# Patient Record
Sex: Female | Born: 1945 | Race: White | Hispanic: No | Smoking: Former smoker
Health system: Southern US, Community
[De-identification: ages and names within clinical notes are randomized; demographics above are authoritative.]

## PROBLEM LIST (undated history)

## (undated) DIAGNOSIS — N200 Calculus of kidney: Secondary | ICD-10-CM

## (undated) DIAGNOSIS — M1990 Unspecified osteoarthritis, unspecified site: Secondary | ICD-10-CM

## (undated) DIAGNOSIS — I428 Other cardiomyopathies: Secondary | ICD-10-CM

## (undated) DIAGNOSIS — K429 Umbilical hernia without obstruction or gangrene: Secondary | ICD-10-CM

## (undated) DIAGNOSIS — N390 Urinary tract infection, site not specified: Secondary | ICD-10-CM

## (undated) DIAGNOSIS — Z803 Family history of malignant neoplasm of breast: Secondary | ICD-10-CM

## (undated) DIAGNOSIS — Z800 Family history of malignant neoplasm of digestive organs: Secondary | ICD-10-CM

## (undated) DIAGNOSIS — Z87442 Personal history of urinary calculi: Secondary | ICD-10-CM

## (undated) DIAGNOSIS — R55 Syncope and collapse: Secondary | ICD-10-CM

## (undated) DIAGNOSIS — D6851 Activated protein C resistance: Secondary | ICD-10-CM

## (undated) DIAGNOSIS — K259 Gastric ulcer, unspecified as acute or chronic, without hemorrhage or perforation: Secondary | ICD-10-CM

## (undated) DIAGNOSIS — E538 Deficiency of other specified B group vitamins: Secondary | ICD-10-CM

## (undated) DIAGNOSIS — E785 Hyperlipidemia, unspecified: Secondary | ICD-10-CM

## (undated) DIAGNOSIS — B019 Varicella without complication: Secondary | ICD-10-CM

## (undated) DIAGNOSIS — K449 Diaphragmatic hernia without obstruction or gangrene: Secondary | ICD-10-CM

## (undated) DIAGNOSIS — C20 Malignant neoplasm of rectum: Secondary | ICD-10-CM

## (undated) DIAGNOSIS — I700 Atherosclerosis of aorta: Secondary | ICD-10-CM

## (undated) DIAGNOSIS — J439 Emphysema, unspecified: Secondary | ICD-10-CM

## (undated) DIAGNOSIS — Z801 Family history of malignant neoplasm of trachea, bronchus and lung: Secondary | ICD-10-CM

## (undated) DIAGNOSIS — K219 Gastro-esophageal reflux disease without esophagitis: Secondary | ICD-10-CM

## (undated) DIAGNOSIS — G8929 Other chronic pain: Secondary | ICD-10-CM

## (undated) DIAGNOSIS — M1612 Unilateral primary osteoarthritis, left hip: Secondary | ICD-10-CM

## (undated) DIAGNOSIS — M5126 Other intervertebral disc displacement, lumbar region: Secondary | ICD-10-CM

## (undated) DIAGNOSIS — Z8052 Family history of malignant neoplasm of bladder: Secondary | ICD-10-CM

## (undated) DIAGNOSIS — C4491 Basal cell carcinoma of skin, unspecified: Secondary | ICD-10-CM

## (undated) DIAGNOSIS — I10 Essential (primary) hypertension: Secondary | ICD-10-CM

## (undated) DIAGNOSIS — K760 Fatty (change of) liver, not elsewhere classified: Secondary | ICD-10-CM

## (undated) DIAGNOSIS — Z973 Presence of spectacles and contact lenses: Secondary | ICD-10-CM

## (undated) DIAGNOSIS — U071 COVID-19: Secondary | ICD-10-CM

## (undated) DIAGNOSIS — E559 Vitamin D deficiency, unspecified: Secondary | ICD-10-CM

## (undated) DIAGNOSIS — L719 Rosacea, unspecified: Secondary | ICD-10-CM

## (undated) DIAGNOSIS — M109 Gout, unspecified: Secondary | ICD-10-CM

## (undated) DIAGNOSIS — M51369 Other intervertebral disc degeneration, lumbar region without mention of lumbar back pain or lower extremity pain: Secondary | ICD-10-CM

## (undated) HISTORY — DX: Unspecified osteoarthritis, unspecified site: M19.90

## (undated) HISTORY — DX: Family history of malignant neoplasm of digestive organs: Z80.0

## (undated) HISTORY — DX: Family history of malignant neoplasm of trachea, bronchus and lung: Z80.1

## (undated) HISTORY — DX: Urinary tract infection, site not specified: N39.0

## (undated) HISTORY — DX: Diaphragmatic hernia without obstruction or gangrene: K44.9

## (undated) HISTORY — DX: Hyperlipidemia, unspecified: E78.5

## (undated) HISTORY — DX: Gastro-esophageal reflux disease without esophagitis: K21.9

## (undated) HISTORY — DX: Syncope and collapse: R55

## (undated) HISTORY — DX: Family history of malignant neoplasm of breast: Z80.3

## (undated) HISTORY — DX: Gout, unspecified: M10.9

## (undated) HISTORY — DX: Umbilical hernia without obstruction or gangrene: K42.9

## (undated) HISTORY — DX: Calculus of kidney: N20.0

## (undated) HISTORY — DX: Fatty (change of) liver, not elsewhere classified: K76.0

## (undated) HISTORY — DX: Family history of malignant neoplasm of bladder: Z80.52

## (undated) HISTORY — DX: Other intervertebral disc displacement, lumbar region: M51.26

## (undated) HISTORY — DX: Emphysema, unspecified: J43.9

## (undated) HISTORY — DX: Varicella without complication: B01.9

## (undated) HISTORY — DX: Basal cell carcinoma of skin, unspecified: C44.91

## (undated) HISTORY — DX: Gastric ulcer, unspecified as acute or chronic, without hemorrhage or perforation: K25.9

## (undated) HISTORY — DX: COVID-19: U07.1

## (undated) HISTORY — PX: JOINT REPLACEMENT: SHX530

---

## 1978-03-05 HISTORY — PX: BREAST BIOPSY: SHX20

## 2004-06-12 ENCOUNTER — Ambulatory Visit: Payer: Self-pay | Admitting: Gastroenterology

## 2004-06-12 LAB — HM COLONOSCOPY

## 2005-07-05 ENCOUNTER — Ambulatory Visit: Payer: Self-pay | Admitting: Unknown Physician Specialty

## 2006-02-13 ENCOUNTER — Ambulatory Visit: Payer: Self-pay | Admitting: Unknown Physician Specialty

## 2006-06-12 ENCOUNTER — Ambulatory Visit: Payer: Self-pay | Admitting: Gastroenterology

## 2006-07-08 ENCOUNTER — Ambulatory Visit: Payer: Self-pay | Admitting: Unknown Physician Specialty

## 2007-07-10 ENCOUNTER — Ambulatory Visit: Payer: Self-pay | Admitting: Unknown Physician Specialty

## 2008-06-08 ENCOUNTER — Ambulatory Visit: Payer: Self-pay | Admitting: Unknown Physician Specialty

## 2008-07-12 ENCOUNTER — Ambulatory Visit: Payer: Self-pay | Admitting: Unknown Physician Specialty

## 2008-10-13 ENCOUNTER — Ambulatory Visit: Payer: Self-pay | Admitting: Family Medicine

## 2009-02-06 ENCOUNTER — Ambulatory Visit: Payer: Self-pay | Admitting: Unknown Physician Specialty

## 2009-05-06 ENCOUNTER — Ambulatory Visit: Payer: Self-pay | Admitting: Unknown Physician Specialty

## 2009-05-16 ENCOUNTER — Ambulatory Visit: Payer: Self-pay | Admitting: Gastroenterology

## 2009-05-30 ENCOUNTER — Ambulatory Visit: Payer: Self-pay | Admitting: Surgery

## 2009-10-25 ENCOUNTER — Ambulatory Visit: Payer: Self-pay | Admitting: Unknown Physician Specialty

## 2011-03-27 ENCOUNTER — Ambulatory Visit: Payer: Self-pay | Admitting: Internal Medicine

## 2012-04-03 ENCOUNTER — Ambulatory Visit: Payer: Self-pay | Admitting: Internal Medicine

## 2012-04-10 ENCOUNTER — Ambulatory Visit: Payer: Self-pay | Admitting: Internal Medicine

## 2012-09-22 ENCOUNTER — Ambulatory Visit: Payer: Self-pay | Admitting: Adult Health

## 2013-06-01 ENCOUNTER — Ambulatory Visit: Payer: Self-pay | Admitting: Family Medicine

## 2014-01-22 ENCOUNTER — Ambulatory Visit: Payer: Self-pay | Admitting: Family Medicine

## 2014-02-02 ENCOUNTER — Ambulatory Visit: Payer: Self-pay | Admitting: Family Medicine

## 2015-09-13 ENCOUNTER — Other Ambulatory Visit: Payer: Self-pay | Admitting: Family Medicine

## 2015-09-13 DIAGNOSIS — R112 Nausea with vomiting, unspecified: Secondary | ICD-10-CM

## 2015-09-14 ENCOUNTER — Ambulatory Visit
Admission: RE | Admit: 2015-09-14 | Discharge: 2015-09-14 | Disposition: A | Discharge status: Home / Self Care | Payer: PPO | Source: Ambulatory Visit | Attending: Family Medicine | Admitting: Family Medicine

## 2015-09-14 DIAGNOSIS — K760 Fatty (change of) liver, not elsewhere classified: Secondary | ICD-10-CM | POA: Insufficient documentation

## 2015-09-14 DIAGNOSIS — R112 Nausea with vomiting, unspecified: Secondary | ICD-10-CM | POA: Insufficient documentation

## 2015-09-14 DIAGNOSIS — K824 Cholesterolosis of gallbladder: Secondary | ICD-10-CM | POA: Insufficient documentation

## 2015-09-19 ENCOUNTER — Ambulatory Visit
Admission: RE | Admit: 2015-09-19 | Discharge: 2015-09-19 | Disposition: A | Discharge status: Home / Self Care | Payer: PPO | Source: Ambulatory Visit | Attending: Family Medicine | Admitting: Family Medicine

## 2015-09-19 ENCOUNTER — Other Ambulatory Visit: Payer: Self-pay | Admitting: Family Medicine

## 2015-09-19 DIAGNOSIS — I700 Atherosclerosis of aorta: Secondary | ICD-10-CM | POA: Insufficient documentation

## 2015-09-19 DIAGNOSIS — R112 Nausea with vomiting, unspecified: Secondary | ICD-10-CM | POA: Insufficient documentation

## 2015-09-19 DIAGNOSIS — R1011 Right upper quadrant pain: Secondary | ICD-10-CM | POA: Insufficient documentation

## 2015-09-19 DIAGNOSIS — K769 Liver disease, unspecified: Secondary | ICD-10-CM | POA: Diagnosis not present

## 2015-09-19 DIAGNOSIS — K449 Diaphragmatic hernia without obstruction or gangrene: Secondary | ICD-10-CM | POA: Insufficient documentation

## 2015-09-19 DIAGNOSIS — I2510 Atherosclerotic heart disease of native coronary artery without angina pectoris: Secondary | ICD-10-CM | POA: Diagnosis not present

## 2015-09-19 DIAGNOSIS — M47899 Other spondylosis, site unspecified: Secondary | ICD-10-CM | POA: Diagnosis not present

## 2015-09-19 DIAGNOSIS — N200 Calculus of kidney: Secondary | ICD-10-CM | POA: Diagnosis not present

## 2015-09-19 DIAGNOSIS — K429 Umbilical hernia without obstruction or gangrene: Secondary | ICD-10-CM | POA: Insufficient documentation

## 2015-09-19 DIAGNOSIS — N289 Disorder of kidney and ureter, unspecified: Secondary | ICD-10-CM | POA: Diagnosis not present

## 2015-09-19 HISTORY — DX: Essential (primary) hypertension: I10

## 2015-09-19 MED ORDER — IOPAMIDOL (ISOVUE-300) INJECTION 61%
100.0000 mL | Freq: Once | INTRAVENOUS | Status: AC | PRN
  Administered 2015-09-19: 80 mL via INTRAVENOUS

## 2015-09-22 ENCOUNTER — Other Ambulatory Visit: Payer: Self-pay | Admitting: Family Medicine

## 2015-09-22 DIAGNOSIS — R112 Nausea with vomiting, unspecified: Secondary | ICD-10-CM

## 2015-09-26 ENCOUNTER — Ambulatory Visit: Payer: PPO

## 2015-09-30 ENCOUNTER — Ambulatory Visit
Admission: RE | Admit: 2015-09-30 | Discharge: 2015-09-30 | Disposition: A | Discharge status: Home / Self Care | Payer: PPO | Source: Ambulatory Visit | Attending: Family Medicine | Admitting: Family Medicine

## 2015-09-30 ENCOUNTER — Encounter: Payer: Self-pay | Admitting: Radiology

## 2015-09-30 DIAGNOSIS — R112 Nausea with vomiting, unspecified: Secondary | ICD-10-CM | POA: Diagnosis present

## 2015-09-30 MED ORDER — TECHNETIUM TC 99M TETROFOSMIN IV KIT
5.0000 | PACK | Freq: Once | INTRAVENOUS | Status: AC | PRN
  Administered 2015-09-30: 5 via INTRAVENOUS

## 2015-11-21 ENCOUNTER — Ambulatory Visit: Payer: Self-pay | Admitting: Family

## 2016-01-12 ENCOUNTER — Ambulatory Visit: Payer: Self-pay | Admitting: Family

## 2016-03-06 DIAGNOSIS — R69 Illness, unspecified: Secondary | ICD-10-CM | POA: Diagnosis not present

## 2016-03-27 DIAGNOSIS — D6851 Activated protein C resistance: Secondary | ICD-10-CM | POA: Diagnosis not present

## 2016-04-09 DIAGNOSIS — M170 Bilateral primary osteoarthritis of knee: Secondary | ICD-10-CM | POA: Diagnosis not present

## 2016-04-09 DIAGNOSIS — R609 Edema, unspecified: Secondary | ICD-10-CM | POA: Diagnosis not present

## 2016-04-09 DIAGNOSIS — E785 Hyperlipidemia, unspecified: Secondary | ICD-10-CM | POA: Diagnosis not present

## 2016-04-09 DIAGNOSIS — Z1159 Encounter for screening for other viral diseases: Secondary | ICD-10-CM | POA: Diagnosis not present

## 2016-04-09 DIAGNOSIS — I10 Essential (primary) hypertension: Secondary | ICD-10-CM | POA: Diagnosis not present

## 2016-04-09 DIAGNOSIS — Z1231 Encounter for screening mammogram for malignant neoplasm of breast: Secondary | ICD-10-CM | POA: Diagnosis not present

## 2016-04-09 DIAGNOSIS — Z23 Encounter for immunization: Secondary | ICD-10-CM | POA: Diagnosis not present

## 2016-04-09 DIAGNOSIS — M1000 Idiopathic gout, unspecified site: Secondary | ICD-10-CM | POA: Diagnosis not present

## 2016-04-09 DIAGNOSIS — K219 Gastro-esophageal reflux disease without esophagitis: Secondary | ICD-10-CM | POA: Diagnosis not present

## 2016-04-10 ENCOUNTER — Other Ambulatory Visit: Payer: Self-pay | Admitting: Family Medicine

## 2016-04-10 DIAGNOSIS — Z1239 Encounter for other screening for malignant neoplasm of breast: Secondary | ICD-10-CM

## 2016-04-19 DIAGNOSIS — R05 Cough: Secondary | ICD-10-CM | POA: Diagnosis not present

## 2016-04-19 DIAGNOSIS — J069 Acute upper respiratory infection, unspecified: Secondary | ICD-10-CM | POA: Diagnosis not present

## 2016-05-16 DIAGNOSIS — R69 Illness, unspecified: Secondary | ICD-10-CM | POA: Diagnosis not present

## 2016-06-05 DIAGNOSIS — R69 Illness, unspecified: Secondary | ICD-10-CM | POA: Diagnosis not present

## 2016-07-09 DIAGNOSIS — M170 Bilateral primary osteoarthritis of knee: Secondary | ICD-10-CM | POA: Diagnosis not present

## 2016-07-09 DIAGNOSIS — M109 Gout, unspecified: Secondary | ICD-10-CM | POA: Diagnosis not present

## 2016-07-09 DIAGNOSIS — Z1231 Encounter for screening mammogram for malignant neoplasm of breast: Secondary | ICD-10-CM | POA: Diagnosis not present

## 2016-07-09 DIAGNOSIS — E559 Vitamin D deficiency, unspecified: Secondary | ICD-10-CM | POA: Diagnosis not present

## 2016-07-09 DIAGNOSIS — E782 Mixed hyperlipidemia: Secondary | ICD-10-CM | POA: Diagnosis not present

## 2016-07-31 ENCOUNTER — Emergency Department: Payer: Medicare HMO

## 2016-07-31 ENCOUNTER — Emergency Department
Admission: EM | Admit: 2016-07-31 | Discharge: 2016-07-31 | Disposition: A | Discharge status: Home / Self Care | Payer: Medicare HMO | Attending: Emergency Medicine | Admitting: Emergency Medicine

## 2016-07-31 DIAGNOSIS — S0083XA Contusion of other part of head, initial encounter: Secondary | ICD-10-CM | POA: Insufficient documentation

## 2016-07-31 DIAGNOSIS — R55 Syncope and collapse: Secondary | ICD-10-CM | POA: Diagnosis present

## 2016-07-31 DIAGNOSIS — Y999 Unspecified external cause status: Secondary | ICD-10-CM | POA: Diagnosis not present

## 2016-07-31 DIAGNOSIS — E860 Dehydration: Secondary | ICD-10-CM | POA: Insufficient documentation

## 2016-07-31 DIAGNOSIS — N3090 Cystitis, unspecified without hematuria: Secondary | ICD-10-CM

## 2016-07-31 DIAGNOSIS — W1839XA Other fall on same level, initial encounter: Secondary | ICD-10-CM | POA: Diagnosis not present

## 2016-07-31 DIAGNOSIS — I10 Essential (primary) hypertension: Secondary | ICD-10-CM | POA: Insufficient documentation

## 2016-07-31 DIAGNOSIS — Y92510 Bank as the place of occurrence of the external cause: Secondary | ICD-10-CM | POA: Diagnosis not present

## 2016-07-31 DIAGNOSIS — S20219A Contusion of unspecified front wall of thorax, initial encounter: Secondary | ICD-10-CM | POA: Diagnosis not present

## 2016-07-31 DIAGNOSIS — Y939 Activity, unspecified: Secondary | ICD-10-CM | POA: Diagnosis not present

## 2016-07-31 DIAGNOSIS — R569 Unspecified convulsions: Secondary | ICD-10-CM | POA: Diagnosis not present

## 2016-07-31 DIAGNOSIS — Z79899 Other long term (current) drug therapy: Secondary | ICD-10-CM | POA: Diagnosis not present

## 2016-07-31 LAB — CBC WITH DIFFERENTIAL/PLATELET
Basophils Absolute: 0 10*3/uL (ref 0–0.1)
Basophils Relative: 0 %
Eosinophils Absolute: 0.2 10*3/uL (ref 0–0.7)
Eosinophils Relative: 2 %
HCT: 44 % (ref 35.0–47.0)
Hemoglobin: 14.5 g/dL (ref 12.0–16.0)
Lymphocytes Relative: 23 %
Lymphs Abs: 1.9 10*3/uL (ref 1.0–3.6)
MCH: 30.4 pg (ref 26.0–34.0)
MCHC: 32.9 g/dL (ref 32.0–36.0)
MCV: 92.4 fL (ref 80.0–100.0)
Monocytes Absolute: 0.4 10*3/uL (ref 0.2–0.9)
Monocytes Relative: 5 %
Neutro Abs: 5.9 10*3/uL (ref 1.4–6.5)
Neutrophils Relative %: 70 %
Platelets: 198 10*3/uL (ref 150–440)
RBC: 4.76 MIL/uL (ref 3.80–5.20)
RDW: 16 % — ABNORMAL HIGH (ref 11.5–14.5)
WBC: 8.5 10*3/uL (ref 3.6–11.0)

## 2016-07-31 LAB — BASIC METABOLIC PANEL WITH GFR
Anion gap: 11 (ref 5–15)
BUN: 22 mg/dL — ABNORMAL HIGH (ref 6–20)
CO2: 27 mmol/L (ref 22–32)
Calcium: 9.7 mg/dL (ref 8.9–10.3)
Chloride: 101 mmol/L (ref 101–111)
Creatinine, Ser: 1.01 mg/dL — ABNORMAL HIGH (ref 0.44–1.00)
GFR calc Af Amer: >60 mL/min
GFR calc non Af Amer: 56 mL/min — ABNORMAL LOW
Glucose, Bld: 168 mg/dL — ABNORMAL HIGH (ref 65–99)
Potassium: 3.2 mmol/L — ABNORMAL LOW (ref 3.5–5.1)
Sodium: 139 mmol/L (ref 135–145)

## 2016-07-31 LAB — URINALYSIS, COMPLETE (UACMP) WITH MICROSCOPIC
Bilirubin Urine: NEGATIVE
Glucose, UA: NEGATIVE mg/dL
Hgb urine dipstick: NEGATIVE
Ketones, ur: NEGATIVE mg/dL
Nitrite: POSITIVE — AB
Protein, ur: NEGATIVE mg/dL
Specific Gravity, Urine: 1.01 (ref 1.005–1.030)
pH: 8 (ref 5.0–8.0)

## 2016-07-31 MED ORDER — TRIAMTERENE-HCTZ 37.5-25 MG PO TABS: 1.0000 | ORAL_TABLET | Freq: Every day | ORAL | Status: DC

## 2016-07-31 MED ORDER — TRIAMTERENE-HCTZ 37.5-25 MG PO TABS
1.0000 | ORAL_TABLET | Freq: Every day | ORAL | Status: DC
  Administered 2016-07-31: 1 via ORAL
  Filled 2016-07-31: qty 1

## 2016-07-31 MED ORDER — CEPHALEXIN 500 MG PO CAPS: 500.0000 mg | ORAL_CAPSULE | Freq: Two times a day (BID) | ORAL | 0 refills | Status: DC

## 2016-07-31 MED ORDER — SODIUM CHLORIDE 0.9 % IV BOLUS (SEPSIS)
1000.0000 mL | Freq: Once | INTRAVENOUS | Status: AC
  Administered 2016-07-31: 1000 mL via INTRAVENOUS

## 2016-07-31 NOTE — ED Notes (Addendum)
Pt states upon entering bank she felt weak and dizzy.EMS found pt face DOWN. Pt answering question to Dr. Joni Fears. Denies urinating on self. Pt told Dr. Joni Fears that witnesses told pt she had a seizure. Denies hx of seizure.

## 2016-07-31 NOTE — ED Provider Notes (Signed)
[facility] Emergency Department Provider Note  ____________________________________________  Time seen: Approximately 2:56 PM  I have reviewed the triage vital signs and the nursing notes.   HISTORY  Chief Complaint Loss of Consciousness    HPI Cynthia Bryan is a 71 y.o. female brought to the ED due to syncope. Patient states she was at the bank when she got lightheaded and felt generalized weakness. She then fell down. She reports that she was told by bystanders that she was unconscious for about a minute. Denies being told of any shaking activity. Denies loss of bowel or bladder continence.EMS reports that the patient was found face down on the ground. Patient thinks she might have had a seizure but denies any history of seizure.     Past Medical History:  Diagnosis Date  . Hypertension   Hyperlipidemia Gout GERD   There are no active problems to display for this patient.    History reviewed. No pertinent surgical history.   Prior to Admission medications   Medication Sig Start Date End Date Taking? Authorizing Provider  allopurinol (ZYLOPRIM) 300 MG tablet Take 300 mg by mouth daily. 07/12/16  Yes [provider]  atorvastatin (LIPITOR) 20 MG tablet Take 20 mg by mouth daily. 07/12/16  Yes [provider]  pantoprazole (PROTONIX) 40 MG tablet Take 40 mg by mouth daily. 07/12/16  Yes [provider]  potassium chloride (MICRO-K) 10 MEQ CR capsule Take 10 mEq by mouth 2 (two) times daily. 07/12/16  Yes [provider]  traMADol (ULTRAM) 50 MG tablet Take 50 mg by mouth every 8 (eight) hours as needed. 07/09/16  Yes [provider]  triamterene-hydrochlorothiazide (MAXZIDE-25) 37.5-25 MG tablet Take 1 tablet by mouth daily. 07/12/16  Yes [provider]  cephALEXin (KEFLEX) 500 MG capsule Take 1 capsule (500 mg total) by mouth 2 (two) times daily. 07/31/16   Carrie Mew, MD      Allergies Patient has no known allergies.   History reviewed. No pertinent family history.  Social History Social History  Substance Use Topics  . Smoking status: Never Smoker  . Smokeless tobacco: Not on file  . Alcohol use No    Review of Systems  Constitutional:   No fever or chills. Positive lightheadedness and generalized weakness ENT:   No sore throat. No rhinorrhea. Cardiovascular:   No chest pain or syncope. Respiratory:   No dyspnea or cough. Gastrointestinal:   Negative for abdominal pain, vomiting and diarrhea.  Musculoskeletal:   Negative for focal pain or swelling All other systems reviewed and are negative except as documented above in ROS and HPI.  ____________________________________________   PHYSICAL EXAM:  VITAL SIGNS: ED Triage Vitals  Enc Vitals Group     BP 07/31/16 1330 (!) 162/83     Pulse Rate 07/31/16 1330 90     Resp --      Temp --      Temp src --      SpO2 07/31/16 1306 98 %     Weight 07/31/16 1248 205 lb (93 kg)     Height 07/31/16 1248 5\' 6"  (1.676 m)     Head Circumference --      Peak Flow --      Pain Score 07/31/16 1247 0     Pain Loc --      Pain Edu? --      Excl. in [place]? --     Vital signs reviewed, nursing assessments reviewed.   Constitutional:  Alert and oriented. Well appearing and in no distress. Eyes:   No scleral icterus. No conjunctival pallor. PERRL. EOMIAnd painless.  No nystagmus. ENT   Head:   Normocephalic With small area of contusion along the right supraorbital ridge. The laceration. No focal bony tenderness instability or crepitus..   Nose:   No congestion/rhinnorhea.    Mouth/Throat:   MMM, no pharyngeal erythema. No peritonsillar mass. No tongue laceration or other oral injury   Neck:   No meningismus. Full ROM. C-spine nontender. Hematological/Lymphatic/Immunilogical:   No cervical lymphadenopathy. Cardiovascular:   RRR. Symmetric bilateral radial and DP pulses.  No murmurs.   Respiratory:   Normal respiratory effort without tachypnea/retractions. Breath sounds are clear and equal bilaterally. No wheezes/rales/rhonchi. Gastrointestinal:   Soft and nontender. Non distended. There is no CVA tenderness.  No rebound, rigidity, or guarding. Genitourinary:   deferred Musculoskeletal:   Normal range of motion in all extremities. No joint effusions.  No lower extremity tenderness.  No edema. No midline spinal tenderness Neurologic:   Normal speech and language.  CN 2-10 normal. Motor grossly intact. No gross focal neurologic deficits are appreciated.  Skin:    Skin is warm, dry and intact. No rash noted.  No petechiae, purpura, or bullae.  ____________________________________________    LABS (pertinent positives/negatives) (all labs ordered are listed, but only abnormal results are displayed) Labs Reviewed  BASIC METABOLIC PANEL - Abnormal; Notable for the following:       Result Value   Potassium 3.2 (*)    Glucose, Bld 168 (*)    BUN 22 (*)    Creatinine, Ser 1.01 (*)    GFR calc non Af Amer 56 (*)    All other components within normal limits  CBC WITH DIFFERENTIAL/PLATELET - Abnormal; Notable for the following:    RDW 16.0 (*)    All other components within normal limits  URINALYSIS, COMPLETE (UACMP) WITH MICROSCOPIC - Abnormal; Notable for the following:    Color, Urine YELLOW (*)    APPearance CLEAR (*)    Nitrite POSITIVE (*)    Leukocytes, UA TRACE (*)    Bacteria, UA RARE (*)    Squamous Epithelial / LPF 0-5 (*)    All other components within normal limits  URINE CULTURE   ____________________________________________   EKG  Interpreted by me Sinus rhythm rate of 82. Left axis, normal intervals. Normal ST segments and T waves  ____________________________________________    RADIOLOGY  Dg Chest 2 View  Result Date: 07/31/2016 CLINICAL DATA:  Fall with right supraorbital contusion.  Syncope EXAM: CHEST  2 VIEW COMPARISON:  None.  FINDINGS: Normal heart size and mediastinal contours. No acute infiltrate or edema. No effusion or pneumothorax. No acute osseous findings. IMPRESSION: Negative chest. Electronically Signed   By: Monte Fantasia M.D.   On: 07/31/2016 13:26   Ct Head Wo Contrast  Result Date: 07/31/2016 CLINICAL DATA:  Syncopal episode falling to the floor EXAM: CT HEAD WITHOUT CONTRAST TECHNIQUE: Contiguous axial images were obtained from the base of the skull through the vertex without intravenous contrast. COMPARISON:  None. FINDINGS: Brain: Chronic generalized brain atrophy. No sign of old or acute infarction, mass lesion, hemorrhage, hydrocephalus or extra-axial collection. Vascular: There is atherosclerotic calcification of the major vessels at the base of the brain. Skull: Pronounced hyperostosis interna of the calvarium. Sinuses/Orbits: Clear/normal Other: None IMPRESSION: No acute or traumatic finding.  Chronic generalized brain atrophy. Pronounced hyperostosis interna of the calvarium, not of specific clinical relevance. Electronically Signed  By: Nelson Chimes M.D.   On: 07/31/2016 13:37    ____________________________________________   PROCEDURES Procedures  ____________________________________________   INITIAL IMPRESSION / ASSESSMENT AND PLAN / ED COURSE  Pertinent labs & imaging results that were available during my care of the patient were reviewed by me and considered in my medical decision making (see chart for details).   Clinical Course as of Jul 31 1925  Tue Jul 31, 2016  1300 C spine clinically clear on hx/exam. Check labs IVF. Likely dehydration. No localizing pain complaints before/after syncope.   [PS]    Clinical Course User Index [PS] Carrie Mew, MD    ----------------------------------------- 7:27 PM on 07/31/2016 -----------------------------------------  Urinalysis result finally obtained. Positive for UTI, nitrite positive. Will start on Keflex. Patient updated.  Follow-up with primary care. Vital signs remained stable in the ED. After hydration she is feeling much better. Tolerating oral intake. Patient is not septic.Considering the patient's symptoms, medical history, and physical examination today, I have low suspicion for ischemic stroke, intracranial hemorrhage, meningitis, encephalitis, carotid or vertebral dissection, venous sinus thrombosis, MS, intracranial hypertension, glaucoma, CRAO, CRVO, or temporal arteritis. Low suspicion for ACS PE dissection or AAA   ____________________________________________   FINAL CLINICAL IMPRESSION(S) / ED DIAGNOSES  Final diagnoses:  Cystitis  Syncope, unspecified syncope type  Dehydration      New Prescriptions   CEPHALEXIN (KEFLEX) 500 MG CAPSULE    Take 1 capsule (500 mg total) by mouth 2 (two) times daily.     Portions of this note were generated with dragon dictation software. Dictation errors may occur despite best attempts at proofreading.    Carrie Mew, MD 07/31/16 Kathyrn Drown

## 2016-07-31 NOTE — ED Notes (Signed)
Pt transported to CT ?

## 2016-07-31 NOTE — ED Triage Notes (Signed)
Pt presents to ED via [facility] from a bank in [place] for a syncopal episode. Witnesses told EMS that pt looked in distress and was having trouble breathing. They told EMS no seizure like activity but EMS reported that pt thought she had a seizure. Pt is alert and oriented upon arrival. States she fell on "a hard floor." pt arrives in c collar, on oxygen because had PVC's and right bundle block per EMS. CBG 136. Initial BP 68/43. 20 L AC. Pt denies pain.

## 2016-07-31 NOTE — ED Notes (Signed)
Pt back from CT

## 2016-08-02 DIAGNOSIS — R8290 Unspecified abnormal findings in urine: Secondary | ICD-10-CM | POA: Diagnosis not present

## 2016-08-02 DIAGNOSIS — R55 Syncope and collapse: Secondary | ICD-10-CM | POA: Diagnosis not present

## 2016-08-02 DIAGNOSIS — E876 Hypokalemia: Secondary | ICD-10-CM | POA: Diagnosis not present

## 2016-08-02 DIAGNOSIS — I10 Essential (primary) hypertension: Secondary | ICD-10-CM | POA: Diagnosis not present

## 2016-08-03 LAB — URINE CULTURE: Culture: >=100000 — AB

## 2016-08-08 DIAGNOSIS — H018 Other specified inflammations of eyelid: Secondary | ICD-10-CM | POA: Diagnosis not present

## 2016-08-08 DIAGNOSIS — H2513 Age-related nuclear cataract, bilateral: Secondary | ICD-10-CM | POA: Diagnosis not present

## 2016-08-13 DIAGNOSIS — R55 Syncope and collapse: Secondary | ICD-10-CM | POA: Diagnosis not present

## 2016-08-17 DIAGNOSIS — R413 Other amnesia: Secondary | ICD-10-CM | POA: Diagnosis not present

## 2016-08-17 DIAGNOSIS — R69 Illness, unspecified: Secondary | ICD-10-CM | POA: Diagnosis not present

## 2016-08-17 DIAGNOSIS — R55 Syncope and collapse: Secondary | ICD-10-CM | POA: Diagnosis not present

## 2016-09-07 DIAGNOSIS — R569 Unspecified convulsions: Secondary | ICD-10-CM | POA: Diagnosis not present

## 2016-10-02 DIAGNOSIS — Z01818 Encounter for other preprocedural examination: Secondary | ICD-10-CM | POA: Diagnosis not present

## 2016-10-02 DIAGNOSIS — M109 Gout, unspecified: Secondary | ICD-10-CM | POA: Diagnosis not present

## 2016-10-02 DIAGNOSIS — E782 Mixed hyperlipidemia: Secondary | ICD-10-CM | POA: Diagnosis not present

## 2016-10-02 DIAGNOSIS — E559 Vitamin D deficiency, unspecified: Secondary | ICD-10-CM | POA: Diagnosis not present

## 2016-10-09 DIAGNOSIS — B961 Klebsiella pneumoniae [K. pneumoniae] as the cause of diseases classified elsewhere: Secondary | ICD-10-CM | POA: Diagnosis not present

## 2016-10-09 DIAGNOSIS — N952 Postmenopausal atrophic vaginitis: Secondary | ICD-10-CM | POA: Diagnosis not present

## 2016-10-09 DIAGNOSIS — M170 Bilateral primary osteoarthritis of knee: Secondary | ICD-10-CM | POA: Diagnosis not present

## 2016-10-09 DIAGNOSIS — R69 Illness, unspecified: Secondary | ICD-10-CM | POA: Diagnosis not present

## 2016-10-09 DIAGNOSIS — N390 Urinary tract infection, site not specified: Secondary | ICD-10-CM | POA: Diagnosis not present

## 2016-10-17 ENCOUNTER — Ambulatory Visit
Admission: RE | Admit: 2016-10-17 | Discharge: 2016-10-17 | Disposition: A | Discharge status: Home / Self Care | Payer: Medicare HMO | Source: Ambulatory Visit | Attending: Family Medicine | Admitting: Family Medicine

## 2016-10-17 DIAGNOSIS — Z1239 Encounter for other screening for malignant neoplasm of breast: Secondary | ICD-10-CM

## 2016-10-17 DIAGNOSIS — R928 Other abnormal and inconclusive findings on diagnostic imaging of breast: Secondary | ICD-10-CM | POA: Insufficient documentation

## 2016-10-17 DIAGNOSIS — Z1231 Encounter for screening mammogram for malignant neoplasm of breast: Secondary | ICD-10-CM | POA: Diagnosis present

## 2016-10-19 ENCOUNTER — Other Ambulatory Visit: Payer: Self-pay | Admitting: Family Medicine

## 2016-10-19 DIAGNOSIS — N6320 Unspecified lump in the left breast, unspecified quadrant: Secondary | ICD-10-CM

## 2016-10-19 DIAGNOSIS — R928 Other abnormal and inconclusive findings on diagnostic imaging of breast: Secondary | ICD-10-CM

## 2016-10-23 DIAGNOSIS — N390 Urinary tract infection, site not specified: Secondary | ICD-10-CM | POA: Diagnosis not present

## 2016-10-23 DIAGNOSIS — B961 Klebsiella pneumoniae [K. pneumoniae] as the cause of diseases classified elsewhere: Secondary | ICD-10-CM | POA: Diagnosis not present

## 2016-10-30 ENCOUNTER — Ambulatory Visit
Admission: RE | Admit: 2016-10-30 | Discharge: 2016-10-30 | Disposition: A | Discharge status: Home / Self Care | Payer: Medicare HMO | Source: Ambulatory Visit | Attending: Family Medicine | Admitting: Family Medicine

## 2016-10-30 DIAGNOSIS — R922 Inconclusive mammogram: Secondary | ICD-10-CM | POA: Diagnosis not present

## 2016-10-30 DIAGNOSIS — R69 Illness, unspecified: Secondary | ICD-10-CM | POA: Diagnosis not present

## 2016-10-30 DIAGNOSIS — N6002 Solitary cyst of left breast: Secondary | ICD-10-CM | POA: Insufficient documentation

## 2016-10-30 DIAGNOSIS — R928 Other abnormal and inconclusive findings on diagnostic imaging of breast: Secondary | ICD-10-CM

## 2016-10-30 DIAGNOSIS — N6320 Unspecified lump in the left breast, unspecified quadrant: Secondary | ICD-10-CM | POA: Diagnosis present

## 2016-10-30 DIAGNOSIS — N6489 Other specified disorders of breast: Secondary | ICD-10-CM | POA: Diagnosis not present

## 2017-01-07 DIAGNOSIS — D485 Neoplasm of uncertain behavior of skin: Secondary | ICD-10-CM | POA: Diagnosis not present

## 2017-01-07 DIAGNOSIS — L249 Irritant contact dermatitis, unspecified cause: Secondary | ICD-10-CM | POA: Diagnosis not present

## 2017-01-07 DIAGNOSIS — Z85828 Personal history of other malignant neoplasm of skin: Secondary | ICD-10-CM | POA: Diagnosis not present

## 2017-01-07 DIAGNOSIS — L821 Other seborrheic keratosis: Secondary | ICD-10-CM | POA: Diagnosis not present

## 2017-01-07 DIAGNOSIS — L570 Actinic keratosis: Secondary | ICD-10-CM | POA: Diagnosis not present

## 2017-01-07 DIAGNOSIS — Z86018 Personal history of other benign neoplasm: Secondary | ICD-10-CM | POA: Diagnosis not present

## 2017-01-10 DIAGNOSIS — I10 Essential (primary) hypertension: Secondary | ICD-10-CM | POA: Diagnosis not present

## 2017-01-10 DIAGNOSIS — R399 Unspecified symptoms and signs involving the genitourinary system: Secondary | ICD-10-CM | POA: Diagnosis not present

## 2017-01-10 DIAGNOSIS — E785 Hyperlipidemia, unspecified: Secondary | ICD-10-CM | POA: Diagnosis not present

## 2017-01-10 DIAGNOSIS — R7302 Impaired glucose tolerance (oral): Secondary | ICD-10-CM | POA: Diagnosis not present

## 2017-01-10 DIAGNOSIS — Z23 Encounter for immunization: Secondary | ICD-10-CM | POA: Diagnosis not present

## 2017-01-17 DIAGNOSIS — E785 Hyperlipidemia, unspecified: Secondary | ICD-10-CM | POA: Diagnosis not present

## 2017-01-17 DIAGNOSIS — R7302 Impaired glucose tolerance (oral): Secondary | ICD-10-CM | POA: Diagnosis not present

## 2017-01-17 DIAGNOSIS — R399 Unspecified symptoms and signs involving the genitourinary system: Secondary | ICD-10-CM | POA: Diagnosis not present

## 2017-01-22 DIAGNOSIS — C44319 Basal cell carcinoma of skin of other parts of face: Secondary | ICD-10-CM | POA: Diagnosis not present

## 2017-01-22 DIAGNOSIS — D485 Neoplasm of uncertain behavior of skin: Secondary | ICD-10-CM | POA: Diagnosis not present

## 2017-01-28 DIAGNOSIS — C4491 Basal cell carcinoma of skin, unspecified: Secondary | ICD-10-CM | POA: Diagnosis not present

## 2017-01-28 DIAGNOSIS — N390 Urinary tract infection, site not specified: Secondary | ICD-10-CM | POA: Diagnosis not present

## 2017-01-28 DIAGNOSIS — C44319 Basal cell carcinoma of skin of other parts of face: Secondary | ICD-10-CM | POA: Diagnosis not present

## 2017-01-28 DIAGNOSIS — R399 Unspecified symptoms and signs involving the genitourinary system: Secondary | ICD-10-CM | POA: Diagnosis not present

## 2017-03-07 ENCOUNTER — Encounter: Payer: Self-pay | Admitting: Internal Medicine

## 2017-03-07 ENCOUNTER — Ambulatory Visit (INDEPENDENT_AMBULATORY_CARE_PROVIDER_SITE_OTHER): Payer: Medicare HMO | Admitting: Internal Medicine

## 2017-03-07 VITALS — BP 142/70 | HR 73 | Temp 98.2°F | Ht 65.0 in | Wt 200.0 lb

## 2017-03-07 DIAGNOSIS — Z1159 Encounter for screening for other viral diseases: Secondary | ICD-10-CM | POA: Diagnosis not present

## 2017-03-07 DIAGNOSIS — N390 Urinary tract infection, site not specified: Secondary | ICD-10-CM

## 2017-03-07 DIAGNOSIS — I10 Essential (primary) hypertension: Secondary | ICD-10-CM

## 2017-03-07 DIAGNOSIS — G8929 Other chronic pain: Secondary | ICD-10-CM

## 2017-03-07 DIAGNOSIS — M109 Gout, unspecified: Secondary | ICD-10-CM | POA: Diagnosis not present

## 2017-03-07 DIAGNOSIS — E876 Hypokalemia: Secondary | ICD-10-CM

## 2017-03-07 DIAGNOSIS — D6851 Activated protein C resistance: Secondary | ICD-10-CM

## 2017-03-07 DIAGNOSIS — M25562 Pain in left knee: Secondary | ICD-10-CM

## 2017-03-07 DIAGNOSIS — C44319 Basal cell carcinoma of skin of other parts of face: Secondary | ICD-10-CM

## 2017-03-07 DIAGNOSIS — K219 Gastro-esophageal reflux disease without esophagitis: Secondary | ICD-10-CM | POA: Diagnosis not present

## 2017-03-07 DIAGNOSIS — K760 Fatty (change of) liver, not elsewhere classified: Secondary | ICD-10-CM

## 2017-03-07 DIAGNOSIS — E785 Hyperlipidemia, unspecified: Secondary | ICD-10-CM | POA: Diagnosis not present

## 2017-03-07 MED ORDER — TRIAMTERENE-HCTZ 37.5-25 MG PO TABS: 1.0000 | ORAL_TABLET | Freq: Every day | ORAL | 1 refills | Status: DC

## 2017-03-07 MED ORDER — PANTOPRAZOLE SODIUM 40 MG PO TBEC: 40.0000 mg | DELAYED_RELEASE_TABLET | Freq: Every day | ORAL | 3 refills | Status: DC

## 2017-03-07 MED ORDER — ALLOPURINOL 300 MG PO TABS: 300.0000 mg | ORAL_TABLET | Freq: Every day | ORAL | 3 refills | Status: DC

## 2017-03-07 MED ORDER — ATORVASTATIN CALCIUM 20 MG PO TABS: 20.0000 mg | ORAL_TABLET | Freq: Every day | ORAL | 3 refills | Status: DC

## 2017-03-07 MED ORDER — POTASSIUM CHLORIDE ER 10 MEQ PO CPCR: 10.0000 meq | ORAL_CAPSULE | Freq: Two times a day (BID) | ORAL | 3 refills | Status: DC

## 2017-03-07 MED ORDER — TRAMADOL HCL 50 MG PO TABS: 50.0000 mg | ORAL_TABLET | Freq: Two times a day (BID) | ORAL | 2 refills | Status: DC | PRN

## 2017-03-07 NOTE — Patient Instructions (Addendum)
Follow up in 6-8 weeks sooner if needed  Think about hepatitis A/B vaccines and Tdap vaccine if not had  -we need to check you for hepatitis B in the future before you get the vaccine We will refer you to Urology for recurrent UTI Please make dermatology follow up with Dr. Phillip Heal Please take Calcium 600 mg 2x per day for osteopenia   Fatty Liver Fatty liver, also called hepatic steatosis or steatohepatitis, is a condition in which too much fat has built up in your liver cells. The liver removes harmful substances from your bloodstream. It produces fluids your body needs. It also helps your body use and store energy from the food you eat. In many cases, fatty liver does not cause symptoms or problems. It is often diagnosed when tests are being done for other reasons. However, over time, fatty liver can cause inflammation that may lead to more serious liver problems, such as scarring of the liver (cirrhosis). What are the causes? Causes of fatty liver may include:  Drinking too much alcohol.  Poor nutrition.  Obesity.  Cushing syndrome.  Diabetes.  Hyperlipidemia.  Pregnancy.  Certain drugs.  Poisons.  Some viral infections.  What increases the risk? You may be more likely to develop fatty liver if you:  Abuse alcohol.  Are pregnant.  Are overweight.  Have diabetes.  Have hepatitis.  Have a high triglyceride level.  What are the signs or symptoms? Fatty liver often does not cause any symptoms. In cases where symptoms develop, they can include:  Fatigue.  Weakness.  Weight loss.  Confusion.  Abdominal pain.  Yellowing of your skin and the white parts of your eyes (jaundice).  Nausea and vomiting.  How is this diagnosed? Fatty liver may be diagnosed by:  Physical exam and medical history.  Blood tests.  Imaging tests, such as an ultrasound, CT scan, or MRI.  Liver biopsy. A small sample of liver tissue is removed using a needle. The sample is then  looked at under a microscope.  How is this treated? Fatty liver is often caused by other health conditions. Treatment for fatty liver may involve medicines and lifestyle changes to manage conditions such as:  Alcoholism.  High cholesterol.  Diabetes.  Being overweight or obese.  Follow these instructions at home:  Eat a healthy diet as directed by your health care provider.  Exercise regularly. This can help you lose weight and control your cholesterol and diabetes. Talk to your health care provider about an exercise plan and which activities are best for you.  Do not drink alcohol.  Take medicines only as directed by your health care provider. Contact a health care provider if: You have difficulty controlling your:  Blood sugar.  Cholesterol.  Alcohol consumption.  Get help right away if:  You have abdominal pain.  You have jaundice.  You have nausea and vomiting. This information is not intended to replace advice given to you by your health care provider. Make sure you discuss any questions you have with your health care provider. Document Released: 04/06/2005 Document Revised: 07/28/2015 Document Reviewed: 07/01/2013 Elsevier Interactive Patient Education  [employer].

## 2017-03-08 LAB — HEPATITIS B CORE ANTIBODY, TOTAL: Hep B Core Total Ab: NONREACTIVE

## 2017-03-08 LAB — HEPATITIS B SURFACE ANTIBODY, QUANTITATIVE: Hep B S AB Quant (Post): <5 m[IU]/mL — ABNORMAL LOW

## 2017-03-08 LAB — HEPATITIS B SURFACE ANTIGEN: Hepatitis B Surface Ag: NONREACTIVE

## 2017-03-10 ENCOUNTER — Encounter: Payer: Self-pay | Admitting: Internal Medicine

## 2017-03-10 DIAGNOSIS — I10 Essential (primary) hypertension: Secondary | ICD-10-CM | POA: Insufficient documentation

## 2017-03-10 DIAGNOSIS — K760 Fatty (change of) liver, not elsewhere classified: Secondary | ICD-10-CM | POA: Insufficient documentation

## 2017-03-10 DIAGNOSIS — C4491 Basal cell carcinoma of skin, unspecified: Secondary | ICD-10-CM | POA: Insufficient documentation

## 2017-03-10 DIAGNOSIS — M25562 Pain in left knee: Secondary | ICD-10-CM | POA: Insufficient documentation

## 2017-03-10 DIAGNOSIS — M109 Gout, unspecified: Secondary | ICD-10-CM | POA: Insufficient documentation

## 2017-03-10 DIAGNOSIS — D6851 Activated protein C resistance: Secondary | ICD-10-CM | POA: Insufficient documentation

## 2017-03-10 DIAGNOSIS — N390 Urinary tract infection, site not specified: Secondary | ICD-10-CM | POA: Insufficient documentation

## 2017-03-10 DIAGNOSIS — E785 Hyperlipidemia, unspecified: Secondary | ICD-10-CM | POA: Insufficient documentation

## 2017-03-10 DIAGNOSIS — K219 Gastro-esophageal reflux disease without esophagitis: Secondary | ICD-10-CM | POA: Insufficient documentation

## 2017-03-10 NOTE — Progress Notes (Signed)
Chief Complaint  Patient presents with  . Establish Care   Establish care. Sister in law of Dr. Verlon Setting former pt who is now deceased as of 06/03/16 Jeannine  1. She c/o recurrent UTI with 4 Utis since May 2018 and does not have sx's per pt until too late and dx'ed UTI. She has not been to urology for this in the past  2. She reports 07/2016 she had presyncopal episode where vision was distorted and fell but no LOC and she could not speak for some time but speech came back in <24 hrs. She had EEG negative and she had cardiac w/u with [facility] which included US carotid echo but no holter  3. H/o right temple BCC bx'ed and tx'ed by Dr. Elmer Ramp 01/2017 but area is not healing  4. HTN slightly elevated today on meds but has not taken this am  5. GERD on PPI and needs refill    Review of Systems  Constitutional: Negative for weight loss.       Gained 5 lbs   HENT: Negative for hearing loss.   Eyes:       Denies recent vision problems   Respiratory: Negative for shortness of breath.   Cardiovascular: Negative for chest pain.  Gastrointestinal: Negative for abdominal pain.  Genitourinary:       +recurrent UTI  Musculoskeletal: Positive for falls.  Skin:       +right temple wound not healing   Neurological: Negative for loss of consciousness.  Psychiatric/Behavioral: Negative for memory loss.   Past Medical History:  Diagnosis Date  . Hypertension    Past Surgical History:  Procedure Laterality Date  . BREAST BIOPSY Right 1980   neg   Family History  Problem Relation Age of Onset  . Breast cancer Mother 44   Social History   Socioeconomic History  . Marital status: Widowed    Spouse name: Not on file  . Number of children: Not on file  . Years of education: Not on file  . Highest education level: Not on file  Social Needs  . Financial resource strain: Not on file  . Food insecurity - worry: Not on file  . Food insecurity - inability: Not on file  . Transportation  needs - medical: Not on file  . Transportation needs - non-medical: Not on file  Occupational History  . Not on file  Tobacco Use  . Smoking status: Former Research scientist (life sciences)  . Smokeless tobacco: Never Used  Substance and Sexual Activity  . Alcohol use: No  . Drug use: Not on file  . Sexual activity: Not on file  Other Topics Concern  . Not on file  Social History Narrative  . Not on file   Current Meds  Medication Sig  . acetaminophen (TYLENOL) 500 MG tablet Take 500 mg by mouth 2 (two) times daily.  Marland Kitchen allopurinol (ZYLOPRIM) 300 MG tablet Take 1 tablet (300 mg total) by mouth daily.  Marland Kitchen atorvastatin (LIPITOR) 20 MG tablet Take 1 tablet (20 mg total) by mouth daily at 6 PM.  . Cholecalciferol (VITAMIN D3) 5000 units CAPS Take by mouth daily.  . Cyanocobalamin (B-12) 5000 MCG CAPS Take by mouth daily.  . Magnesium 500 MG CAPS Take 1 tablet by mouth at bedtime.  . pantoprazole (PROTONIX) 40 MG tablet Take 1 tablet (40 mg total) by mouth daily.  . potassium chloride (MICRO-K) 10 MEQ CR capsule Take 1 capsule (10 mEq total) by mouth 2 (two) times daily.  Marland Kitchen  traMADol (ULTRAM) 50 MG tablet Take 1 tablet (50 mg total) by mouth 2 (two) times daily as needed.  . triamterene-hydrochlorothiazide (MAXZIDE-25) 37.5-25 MG tablet Take 1 tablet by mouth daily.  . [DISCONTINUED] allopurinol (ZYLOPRIM) 300 MG tablet Take 300 mg by mouth daily.  . [DISCONTINUED] atorvastatin (LIPITOR) 20 MG tablet Take 20 mg by mouth daily.  . [DISCONTINUED] pantoprazole (PROTONIX) 40 MG tablet Take 40 mg by mouth daily.  . [DISCONTINUED] potassium chloride (MICRO-K) 10 MEQ CR capsule Take 10 mEq by mouth 2 (two) times daily.  . [DISCONTINUED] traMADol (ULTRAM) 50 MG tablet Take 50 mg by mouth every 8 (eight) hours as needed.  . [DISCONTINUED] triamterene-hydrochlorothiazide (MAXZIDE-25) 37.5-25 MG tablet Take 1 tablet by mouth daily.   Allergies  Allergen Reactions  . Nortriptyline Nausea Only  . Sulfa Antibiotics Rash    Recent Results (from the past 2160 hour(s))  Hepatitis B surface antibody     Status: Abnormal   Collection Time: 03/07/17  9:00 AM  Result Value Ref Range   Hepatitis B-Post <5 (L) > OR = 10 mIU/mL    Comment: . Patient does not have immunity to hepatitis B virus. . For additional information, please refer to http://education.questdiagnostics.com/faq/FAQ105 (This link is being provided for informational/ educational purposes only).   Hepatitis B core antibody, total     Status: None   Collection Time: 03/07/17  9:00 AM  Result Value Ref Range   Hep B Core Total Ab NON-REACTIVE NON-REACTI  Hepatitis B surface antigen     Status: None   Collection Time: 03/07/17  9:00 AM  Result Value Ref Range   Hepatitis B Surface Ag NON-REACTIVE NON-REACTI   Objective  Body mass index is 33.28 kg/m. Wt Readings from Last 3 Encounters:  03/07/17 200 lb (90.7 kg)  07/31/16 205 lb (93 kg)   Temp Readings from Last 3 Encounters:  03/07/17 98.2 F (36.8 C) (Oral)   BP Readings from Last 3 Encounters:  03/07/17 (!) 142/70  07/31/16 (!) 153/64   Pulse Readings from Last 3 Encounters:  03/07/17 73  07/31/16 77   O2 sat room air 97%  Physical Exam  Constitutional: She is oriented to person, place, and time and well-developed, well-nourished, and in no distress.  HENT:  Head: Normocephalic and atraumatic.  Mouth/Throat: Oropharynx is clear and moist and mucous membranes are normal.  Eyes: Conjunctivae are normal. Pupils are equal, round, and reactive to light.  Cardiovascular: Normal rate, regular rhythm and normal heart sounds.  Pulmonary/Chest: Effort normal and breath sounds normal.  Abdominal: Soft. Bowel sounds are normal. There is no tenderness.  Neurological: She is alert and oriented to person, place, and time. Gait normal.  Skin: Skin is warm and dry.     +right temple s/p bx site not healing  Psychiatric: Mood, memory, affect and judgment normal.  Nursing note and  vitals reviewed.   Assessment   1. HTN  2. Recurrent UTI  3. S/p BCC right temple not healing  4. H/o presyncope 07/2016 w/u neg EEG, US carotids not found in review of chart, echo 08/13/16 mild conc. LVH with mild global hypokinesis EF 38-10% mild diastolic dysfunction, mild left atrial enlargement. No sig valvular regurgitation or stenosis  5. Gout controlled  6. HM 7. Chronic left knee pain per pt needs replacement  8. GERD Plan  1. Cont meds.   Reviewed labs:  CMET nl 01/17/17, CBC WBC 8.1/13.6/42.9/216 10/02/16, A1C 5.6 01/17/17,  Lipid panel 01/17/17 TC 186, TG 201,  HDL 51, LDL 95  TSH 1.10 and free T4 nl 08/02/16   2. Refer to urology recurrent UTI  3. rec pt call dermatology Dr. Phillip Heal due to bx site not healing there may be residual BCC  4.  Monitor NTD no sx's for now Will call in future and disc EF with cardiologist [facility].  Need to control BP  5.  Cont meds   6.  UTD flu shot had 12/2016  ?last Tdap  Pneumonia shots 2018 ? Date and which vaccine per pt had both  Had zostavax 2014 consider shingrix in future   ? Date of last colonoscopy per pt she had in her 82s only 1x no h/o polyps had [facility] will get records  ? Date of last pap per pt no h/o abnormal pt Dr. Vickki Muff has record. Last I can see 11/21/01 consider 1 more if it has really been this long.  Mammogram 10/30/16 mammo rec screen in 1 year multiple benign left breasts cysts  DEXA 04/03/12 osteopenia rec calcium 600 mg bid and on vit D3 5000 qd. Vit D 37 10/02/16   Former smoker total 30 years max 1 ppd no FH lung cancer quit 20 years ago from 2019   Hep C neg 04/19/16  Check hep B status today   Disc wt loss goal weight is 174 for pt she was this years ago.   7.  Pt signed chronic pain contract today she is not ready for knee surgery  Tramadol 50 mg bid prn   8. Refilled PPI  Get records -Dr. Vickki Muff (vaccines, pap) -[facility] colonoscopy   Other physicians:  Dr. Antoine Primas Dr. Wayne Both  [facility] [place]  Provider: Dr. Olivia Mackie McLean-Scocuzza-Internal Medicine

## 2017-03-11 ENCOUNTER — Encounter: Payer: Self-pay | Admitting: *Deleted

## 2017-03-19 DIAGNOSIS — Z23 Encounter for immunization: Secondary | ICD-10-CM | POA: Diagnosis not present

## 2017-03-24 NOTE — Progress Notes (Signed)
03/25/2017 4:10 PM   Cynthia Bryan 1945/08/22 [id number]  Referring provider: McLean-Scocuzza, Nino Glow, MD [place], [place] [address]  Chief Complaint  Patient presents with  . Recurrent UTI    HPI: Patient is a 72 -year-old Caucasian female who is referred to [place] by Dr. Olivia Mackie McLean-Scocuzza for recurrent urinary tract infections.  She had a fall at the end of May.  She did not trip or lose her balance but just collapsed to the ground.  She states she was conscious the entire time.  When she was admitted for evaluation she was told that she had a UTI.  She states that she has had follow-up urine cultures which also have been positive.  She states she is unaware that she has urinary tract infections.  She denies any dysuria, suprapubic pain, lower back pain, gross hematuria, fever, chills, nausea, nausea or any mental status changes.  She has not had a fall since that incident in May.  .  Reviewing her records,  she has had four documented UTI's over the last year.    She does not have a history of nephrolithiasis, GU surgery or GU trauma.   She is not sexually active.  She is postmenopausal.   She denies constipation and/or diarrhea.   She does engage in good perineal hygiene. She does not take tub baths.   She has had urge and stress incontinence for quite a while.  She does not wear incontinence pads.  She is experiencing nocturia x2 for the last several years.  Over the last few months she has noticed an increase in urinary frequency and an increase in the urge to urinate.    Contrast CT in 09/2015 noted both adrenal glands appear normal. Both kidneys demonstrate mild cortical thinning. There is a curvilinear calcification in the upper pole of the left kidney measuring up to 8 mm, most likely a calculus. No evidence of ureteral calculus or hydronephrosis. There are several sub cm low-density renal lesions which are too small to characterize. However, no worrisome  renal findings are seen. The bladder appears unremarkable.  She is drinking eight 8 ounces of water daily.    Reviewed referral notes.    PMH: Past Medical History:  Diagnosis Date  . Arthritis   . Basal cell carcinoma    right temple Dr. Elmer Ramp removed 01/2017   . Chicken pox   . Emphysema of lung ([place])    h/o bronchitis not emphysema  . Fatty liver    09/19/15  . Gastric ulcer    neg H pylori   . GERD (gastroesophageal reflux disease)   . Gout   . Hiatal hernia   . Hyperlipidemia   . Hypertension   . Kidney stone    noted CT ab/pelvis 09/19/15 left kidney   . Recurrent UTI   . Syncope    per pt presyncope no LOC felt dizzy/vision distorted and couldnt speak EEG neg, CT head neg 07/31/16, f/u [facility] cardiology with US carotid, echo but no Holter   . Umbilical hernia    noted 09/19/15     Surgical History: Past Surgical History:  Procedure Laterality Date  . BREAST BIOPSY Right 1980   cyst removed negative pathology   . JOINT REPLACEMENT     right knee 11/2015 Dr. Janell Quiet [facility] [place]    Home Medications:  Allergies as of 03/25/2017      Reactions   Nortriptyline Nausea Only   Sulfa Antibiotics  Rash      Medication List        Accurate as of 03/25/17  4:10 PM. Always use your most recent med list.          acetaminophen 500 MG tablet Commonly known as:  TYLENOL Take 1,000 mg by mouth 2 (two) times daily.   allopurinol 300 MG tablet Commonly known as:  ZYLOPRIM Take 1 tablet (300 mg total) by mouth daily.   atorvastatin 20 MG tablet Commonly known as:  LIPITOR Take 1 tablet (20 mg total) by mouth daily at 6 PM.   B-12 5000 MCG Caps Take by mouth daily.   calcium carbonate 1250 MG capsule Take 1,250 mg by mouth 2 (two) times daily with a meal.   Magnesium 500 MG Caps Take 1 tablet by mouth at bedtime.   pantoprazole 40 MG tablet Commonly known as:  PROTONIX Take 1 tablet (40 mg total) by mouth daily.   potassium chloride 10 MEQ CR  capsule Commonly known as:  MICRO-K Take 1 capsule (10 mEq total) by mouth 2 (two) times daily.   traMADol 50 MG tablet Commonly known as:  ULTRAM Take 1 tablet (50 mg total) by mouth 2 (two) times daily as needed.   triamterene-hydrochlorothiazide 37.5-25 MG tablet Commonly known as:  MAXZIDE-25 Take 1 tablet by mouth daily.   Vitamin D3 5000 units Caps Take by mouth daily.       Allergies:  Allergies  Allergen Reactions  . Nortriptyline Nausea Only  . Sulfa Antibiotics Rash    Family History: Family History  Problem Relation Age of Onset  . Breast cancer Mother 75  . Cancer Mother        breast cancer s/p masectomy lived to be 8   . Hypertension Mother   . Cancer Father        NMSC skin cancer   . Gout Father   . Heart disease Father        s/p bypass surgery age 70 lived 5 months after surgery   . Kidney disease Father        RAS>kidney failure   . Hyperlipidemia Other   . Stroke Other   . Kidney cancer Neg Hx   . Bladder Cancer Neg Hx   . Prolactinoma Neg Hx     Social History:  reports that she has quit smoking. she has never used smokeless tobacco. She reports that she does not drink alcohol. Her drug history is not on file.  ROS: UROLOGY Frequent Urination?: Yes Hard to postpone urination?: Yes Burning/pain with urination?: No Get up at night to urinate?: Yes Leakage of urine?: Yes Urine stream starts and stops?: No Trouble starting stream?: No Do you have to strain to urinate?: No Blood in urine?: No Urinary tract infection?: Yes Sexually transmitted disease?: No Injury to kidneys or bladder?: No Painful intercourse?: No Weak stream?: No Currently pregnant?: No Vaginal bleeding?: No Last menstrual period?: n  Gastrointestinal Nausea?: No Vomiting?: No Indigestion/heartburn?: No Diarrhea?: No Constipation?: No  Constitutional Fever: No Night sweats?: No Weight loss?: No Fatigue?: No  Skin Skin rash/lesions?: No Itching?:  No  Eyes Blurred vision?: No Double vision?: No  Ears/Nose/Throat Sore throat?: No Sinus problems?: No  Hematologic/Lymphatic Swollen glands?: No Easy bruising?: Yes  Cardiovascular Leg swelling?: No Chest pain?: No  Respiratory Cough?: No Shortness of breath?: No  Endocrine Excessive thirst?: No  Musculoskeletal Back pain?: Yes Joint pain?: Yes  Neurological Headaches?: No Dizziness?: No  Psychologic Depression?: No  Anxiety?: No  Physical Exam: BP (!) 159/84 (BP Location: Right Arm, Patient Position: Sitting, Cuff Size: Normal)   Pulse 72   Ht 5' 5"  (1.651 m)   Wt 195 lb 11.2 oz (88.8 kg)   BMI 32.57 kg/m   Constitutional: Well nourished. Alert and oriented, No acute distress. HEENT: [place] AT, moist mucus membranes. Trachea midline, no masses. Cardiovascular: No clubbing, cyanosis, or edema. Respiratory: Normal respiratory effort, no increased work of breathing. GI: Abdomen is soft, non tender, non distended, no abdominal masses. Liver and spleen not palpable.  No hernias appreciated.  Stool sample for occult testing is not indicated.   GU: No CVA tenderness.  No bladder fullness or masses.  Atrophic external genitalia, normal pubic hair distribution, no lesions.  Normal urethral meatus, no lesions, no prolapse, no discharge.   No urethral masses, tenderness and/or tenderness. No bladder fullness, tenderness or masses. Pale vagina mucosa, poor estrogen effect, no discharge, no lesions, good pelvic support, Grade II cystocele is noted.  No rectocele noted.  No cervical motion tenderness.  Uterus is freely mobile and non-fixed.  No adnexal/parametria masses or tenderness noted.  Anus and perineum are without rashes or lesions.    Skin: No rashes, bruises or suspicious lesions. Lymph: No cervical or inguinal adenopathy. Neurologic: Grossly intact, no focal deficits, moving all 4 extremities. Psychiatric: Normal mood and affect.  Laboratory Data:  UTI's 01/2017   E.coli pansensitive 08/18 Proteus amp, nitrofurantoin and tetracycline resistant 07/18 Kleb. Pneumoniae amp resistant 05/18 E.coli pansensitive  Lab Results  Component Value Date   WBC 8.5 07/31/2016   HGB 14.5 07/31/2016   HCT 44.0 07/31/2016   MCV 92.4 07/31/2016   PLT 198 07/31/2016    Lab Results  Component Value Date   CREATININE 1.01 (H) 07/31/2016    No results found for: PSA  No results found for: TESTOSTERONE  No results found for: HGBA1C  No results found for: TSH  No results found for: CHOL, HDL, CHOLHDL, VLDL, LDLCALC  No results found for: AST No results found for: ALT No components found for: ALKALINEPHOPHATASE No components found for: BILIRUBINTOTAL  No results found for: ESTRADIOL  Urinalysis    Component Value Date/Time   COLORURINE YELLOW (A) 07/31/2016 [place] (A) 07/31/2016 1755   LABSPEC 1.010 07/31/2016 1755   PHURINE 8.0 07/31/2016 [place] 07/31/2016 [place] 07/31/2016 [place] 07/31/2016 [place] 07/31/2016 1755   PROTEINUR NEGATIVE 07/31/2016 1755   NITRITE POSITIVE (A) 07/31/2016 1755   LEUKOCYTESUR TRACE (A) 07/31/2016 1755    I have reviewed the labs.   Pertinent Imaging: CLINICAL DATA:  Vomiting for 3 weeks. Several episodes of diarrhea with back pain.  EXAM: CT ABDOMEN AND PELVIS WITH CONTRAST  TECHNIQUE: Multidetector CT imaging of the abdomen and pelvis was performed using the standard protocol following bolus administration of intravenous contrast.  CONTRAST:  72m ISOVUE-300 IOPAMIDOL (ISOVUE-300) INJECTION 61%  COMPARISON:  Ultrasound 09/14/2015  FINDINGS: Lower chest: Clear lung bases. No significant pleural or pericardial effusion. There is a small hiatal hernia.  Hepatobiliary: Relative to the spleen, the liver demonstrates decreased density suspicious for steatosis. There is probable focal fat posteriorly in the  medial segment of the left hepatic lobe, best seen on images 23 through 26. 6 mm low-density lesion in the lateral segment of the left lobe is best seen on coronal image 39, too small to optimally characterize. No worrisome  hepatic findings. No evidence of gallstones, gallbladder wall thickening or biliary dilatation.  Pancreas: Unremarkable. No pancreatic ductal dilatation or surrounding inflammatory changes.  Spleen: Normal in size without focal abnormality.  Adrenals/Urinary Tract: Both adrenal glands appear normal. Both kidneys demonstrate mild cortical thinning. There is a curvilinear calcification in the upper pole of the left kidney measuring up to 8 mm, most likely a calculus. No evidence of ureteral calculus or hydronephrosis. There are several sub cm low-density renal lesions which are too small to characterize. However, no worrisome renal findings are seen. The bladder appears unremarkable.  Stomach/Bowel: No evidence of bowel wall thickening, distention or surrounding inflammatory change. There is a probable small appendix without surrounding inflammation.  Vascular/Lymphatic: There are no enlarged abdominal or pelvic lymph nodes. Mild aortic and branch vessel atherosclerosis. No acute vascular findings.  Reproductive: The uterus and ovaries appear normal. No evidence of adnexal mass. A degree of pelvic floor laxity likely.  Other: Small umbilical hernia containing only fat.  No ascites.  Musculoskeletal: No acute osseous findings. There is multilevel spondylosis.  IMPRESSION: 1. No acute findings or explanation for the patient's symptoms. The bowel gas pattern is normal. 2. Small hiatal hernia. 3. Probable hepatic steatosis with focal fat posteriorly in the left hepatic lobe. 4. Nonobstructing left renal calculus. 5. Small low-density left hepatic and bilateral renal lesions, probably incidental. 6. Additional findings including mild atherosclerosis,  probable pelvic floor laxity, a small umbilical hernia containing only fat and spondylosis.   Electronically Signed   By: Richardean Sale M.D.   On: 09/19/2015 12:28 I have independently reviewed the films.    Assessment & Plan:    1. Recurrent UTI's  - criteria for recurrent UTI has been met with 2 or more infections in 6 months or 3 or greater infections in one year -but these be positive urine cultures were associated with no symptoms, I did explain the idea of colonization versus infection and advised her not to have her urine checked if she was not experiencing urinary symptoms or mental status changes  - Patient is instructed to increase their water intake until the urine is pale yellow or clear (10 to 12 cups daily)   - probiotics (yogurt, oral pills or vaginal suppositories), take cranberry pills or drink the juice and Vitamin C 1,000 mg daily to acidify the urine should be added to their daily regimen   -avoid soaking in tubs and wipe front to back after urinating   - advised them to have CATH UA's for urinalysis and culture to prevent skin contamination of the specimen  - reviewed symptoms of UTI and advised not to have urine checked or be treated for UTI if not experiencing symptoms  - discussed antibiotic stewardship with the patient    - CATH UA today was negative - urine sent for culture but will not prescribe an antibiotic unless it is positive and she is with symptoms  2. Vaginal atrophy  - I explained to the patient that when women go through menopause and her estrogen levels are severely diminished, the normal vaginal flora will change.  This is due to an increase of the vaginal canal's pH. Because of this, the vaginal canal may be colonized by bacteria from the rectum instead of the protective lactobacillus.  This, accompanied by the loss of the mucus barrier with vaginal atrophy, is a cause of recurrent urinary tract infections.  - In some studies, the use of vaginal  estrogen cream has been demonstrated to  reduce  recurrent urinary tract infections to one a year.   - Patient was given a sample of vaginal estrogen cream (Premarin vaginal cream) and instructed to apply 0.15m (pea-sized amount)  just inside the vaginal introitus with a finger-tip on Monday, Wednesday and Friday nights.  I explained to the patient that vaginally administered estrogen, which causes only a slight increase in the blood estrogen levels, have fewer contraindications and adverse systemic effects that oral HT.  - I have also given prescriptions for the Estrace cream and Premarin cream, so that the patient may carry them to the pharmacy to see which one of the branded creams would be most economical for her.  If she finds both medications cost prohibitive, she is instructed to call the office.  We can then call in a compounded vaginal estrogen cream for the patient that may be more affordable.    - She will follow up in three months for an exam.                                             Return in about 3 months (around 06/23/2017) for OAB questionnaire, PVR and exam.  These notes generated with voice recognition software. I apologize for typographical errors.  SZara Council [facility] [address] [place] [phone]

## 2017-03-25 ENCOUNTER — Encounter: Payer: Self-pay | Admitting: Urology

## 2017-03-25 ENCOUNTER — Ambulatory Visit: Payer: Medicare HMO | Admitting: Urology

## 2017-03-25 VITALS — BP 159/84 | HR 72 | Ht 65.0 in | Wt 195.7 lb

## 2017-03-25 DIAGNOSIS — N390 Urinary tract infection, site not specified: Secondary | ICD-10-CM | POA: Diagnosis not present

## 2017-03-25 DIAGNOSIS — N952 Postmenopausal atrophic vaginitis: Secondary | ICD-10-CM | POA: Diagnosis not present

## 2017-03-25 LAB — URINALYSIS, COMPLETE
Bilirubin, UA: NEGATIVE
Glucose, UA: NEGATIVE
Ketones, UA: NEGATIVE
Leukocytes, UA: NEGATIVE
Nitrite, UA: NEGATIVE
Protein, UA: NEGATIVE
RBC, UA: NEGATIVE
Specific Gravity, UA: 1.01 (ref 1.005–1.030)
Urobilinogen, Ur: 0.2 mg/dL (ref 0.2–1.0)
pH, UA: 7 (ref 5.0–7.5)

## 2017-03-25 LAB — MICROSCOPIC EXAMINATION
Bacteria, UA: NONE SEEN
Epithelial Cells (non renal): NONE SEEN /HPF

## 2017-03-25 MED ORDER — ESTRADIOL 0.1 MG/GM VA CREA: TOPICAL_CREAM | VAGINAL | 12 refills | Status: DC

## 2017-03-25 MED ORDER — ESTROGENS, CONJUGATED 0.625 MG/GM VA CREA: TOPICAL_CREAM | VAGINAL | 11 refills | Status: DC

## 2017-03-25 NOTE — Progress Notes (Signed)
In and Out Catheterization  Patient is present today for a I & O catheterization due to recurrent UTI. Patient was cleaned and prepped in a sterile fashion with betadine and Lidocaine 2% jelly was instilled into the urethra.  A 14FR cath was inserted no complications were noted , 210ml of urine return was noted, urine was yellow in color. A clean urine sample was collected for UA, UCX. Bladder was drained  And catheter was removed with out difficulty.    Preformed by: Elberta Leatherwood, CMA

## 2017-03-25 NOTE — Patient Instructions (Signed)
                                             Urinary Tract Infection Prevention Patient Education Stay Hydrated: Urinary tract infections (UTIs) are less likely to occur in someone who is drinking enough water to promote regular urination, so it is very important to stay hydrated in order to help flush out bacteria from the urinary tract. Respond to "Nature's Call": It is always a good idea to urinate as soon as you feel the need. While "holding it in" does not directly cause an infection, it can cause overdistension that can damage the lining of the bladder, making it more vulnerable to bacteria. Remove Tampons Before Going: Remember to always take out tampons before urinating, and change tampons often.  Practice Proper Bathroom Hygiene: To keep bacteria near the urethral opening to a minimum, it is important to practice proper wiping techniques (i.e. front to back wiping) to help prevent rectal bacteria from entering the uretro-genital area. It can also be helpful to take showers and avoid soaking in the bathtub.  Take a Vitamin C Supplement: About 1,000 milligrams of vitamin C taken daily can help inhibit the growth of some bacteria by acidifying the urine. Maintain Control with Cranberries: Cranberries contain hippuronic acid, which is a natural antiseptic that may help prevent the adherence of bacteria to the bladder lining. Drinking 100% pure cranberry juice or taking over the counter cranberry supplements twice daily may help to prevent an infection. However, it is important to note that cranberry juices/supplements are not helpful once a urinary tract infection (UTI) is present. Strengthen Your Core: Often, a lazy bladder (unable to empty urine properly) occurs due to lower back problem, so consider doing exercises to help strengthen your back, pelvic floor, and stomach muscles. D[place] Pay Attention to Your Urine: Your urine can change color for a variety of reasons, including from the  medications you take, so pay close attention to it to monitor your overall health. One key thing to note is that if your urine is typically a darker yellow, your body is dehydrated, so you need to step up your water intake.   I have given you two prescriptions for a vaginal estrogen cream.  Estrace and Premarin.  Please take these to your pharmacy and see which one your insurance covers.  If both are too expensive, please call the office at [phone] for an alternative.  You are given a sample of vaginal estrogen cream Premarin and instructed to apply 0.5mg  (pea-sized amount)  just inside the vaginal introitus with a finger-tip on Monday, Wednesday and Friday nights,

## 2017-03-27 LAB — CULTURE, URINE COMPREHENSIVE

## 2017-04-05 ENCOUNTER — Telehealth: Payer: Self-pay

## 2017-04-05 NOTE — Telephone Encounter (Signed)
Spoke with pt in reference to ucx results. Pt voiced understanding.  

## 2017-04-05 NOTE — Telephone Encounter (Signed)
-----   Message from Hollice Espy, MD sent at 04/04/2017  9:01 AM EST ----- This patient's urinalysis was completely negative.  Incidentally, she grew E. coli in her urine.  Based on she has no, and it appears that this culture was sent in the absence of symptoms to establish whether or not she was colonized.  It appears that she may be colonized.  We will not plan on treating this unless she becomes symptomatic.  Please let her know.  Hollice Espy, MD

## 2017-04-05 NOTE — Telephone Encounter (Signed)
LMOM

## 2017-04-16 ENCOUNTER — Other Ambulatory Visit: Payer: Self-pay | Admitting: Internal Medicine

## 2017-04-16 NOTE — Progress Notes (Signed)
Reviewed colonoscopy 06/12/04 Dr. Rhona Leavens [place] repeat in 10 years   EGD 06/12/04 hiatal hernia with biopsies taken inactive chronic inflammation and reactive glandular changes of gastric type epithelium w/in sqaumocolumna junction mucosa, neg bx   Cynthia Bryan

## 2017-04-22 DIAGNOSIS — Z23 Encounter for immunization: Secondary | ICD-10-CM | POA: Diagnosis not present

## 2017-05-31 DIAGNOSIS — R69 Illness, unspecified: Secondary | ICD-10-CM | POA: Diagnosis not present

## 2017-06-24 ENCOUNTER — Ambulatory Visit: Payer: Self-pay | Admitting: Urology

## 2017-06-26 ENCOUNTER — Other Ambulatory Visit: Payer: Self-pay

## 2017-06-26 ENCOUNTER — Encounter: Payer: Self-pay | Admitting: Family Medicine

## 2017-06-26 ENCOUNTER — Ambulatory Visit (INDEPENDENT_AMBULATORY_CARE_PROVIDER_SITE_OTHER): Payer: Medicare HMO | Admitting: Family Medicine

## 2017-06-26 VITALS — BP 138/78 | HR 87 | Temp 98.8°F | Wt 192.6 lb

## 2017-06-26 DIAGNOSIS — S40861A Insect bite (nonvenomous) of right upper arm, initial encounter: Secondary | ICD-10-CM | POA: Diagnosis not present

## 2017-06-26 DIAGNOSIS — W57XXXA Bitten or stung by nonvenomous insect and other nonvenomous arthropods, initial encounter: Secondary | ICD-10-CM | POA: Diagnosis not present

## 2017-06-26 DIAGNOSIS — R300 Dysuria: Secondary | ICD-10-CM | POA: Diagnosis not present

## 2017-06-26 LAB — POCT URINALYSIS DIPSTICK
Bilirubin, UA: NEGATIVE
Blood, UA: POSITIVE
Glucose, UA: NEGATIVE
Ketones, UA: NEGATIVE
Nitrite, UA: POSITIVE
Protein, UA: POSITIVE
Spec Grav, UA: 1.02
Urobilinogen, UA: 0.2 U/dL
pH, UA: 6

## 2017-06-26 MED ORDER — CEPHALEXIN 500 MG PO CAPS: 500.0000 mg | ORAL_CAPSULE | Freq: Two times a day (BID) | ORAL | 0 refills | Status: DC

## 2017-06-26 MED ORDER — DOXYCYCLINE HYCLATE 100 MG PO TABS: 100.0000 mg | ORAL_TABLET | Freq: Two times a day (BID) | ORAL | 0 refills | Status: DC

## 2017-06-26 NOTE — Assessment & Plan Note (Signed)
UA concerning for UTI.  Will treat with Keflex.  Sent for culture and microscopy.

## 2017-06-26 NOTE — Patient Instructions (Signed)
Nice to meet you. We will start on doxycycline to cover for possible tickborne illness. We will check your urine and call you with the results. If you develop worsening headache, numbness, weakness, abdominal pain, cough productive of blood, blood in your stool, or any new or changing symptoms please seek medical attention immediately.

## 2017-06-26 NOTE — Assessment & Plan Note (Signed)
Patient symptoms are somewhat nonspecific though in the setting of a tick bite previously she warrants treatment for tickborne illness.  Symptoms could be related to tickborne illness versus UTI versus other viral illness.  She has a fairly benign exam with only mild discomfort on palpation of her left SI joint which could be muscular strain related.  She will be treated with doxycycline.  We will check RMSF titers.  Urinalysis is concerning for UTI and thus we will treat with Keflex.  Will send for culture and microscopy.  She is given return precautions.

## 2017-06-26 NOTE — Progress Notes (Signed)
Tommi Rumps, MD Phone: [phone]  Cynthia Bryan is a 72 y.o. female who presents today for same day visit.  She notes symptoms started 4 days ago.  She notes a variety of symptoms.  Started off slowly and built its way up.  She has had some chills for the last 3 days but no fevers.  She notes some sweats.  She notes sneezing.  No significant congestion.  She has had a left-sided headache which she describes as stabbing.  No sudden onset worst headache of life.  She does have a history of similar headaches in the past though it has been several years.  She notes no numbness, weakness, or vision changes.  She does note some nausea.  She vomited once and it was nonbloody.  She notes diarrhea on Monday though that was nonbloody as well.  No shortness of breath.  She does report being bitten by a tick on her right bicep a couple of weeks ago.  It was not engorged though she does not know how long it was in place.  She does note some dysuria.  No abdominal pain.  Does note some slight low back discomfort.  Social History   Tobacco Use  Smoking Status Former Smoker  Smokeless Tobacco Never Used  Tobacco Comment   smoked total 30 years quit 20 years ago from 2019 max 1 ppd no FH lung cancer      ROS see history of present illness  Objective  Physical Exam Vitals:   06/26/17 1530  BP: 138/78  Pulse: 87  Temp: 98.8 F (37.1 C)  SpO2: 98%    BP Readings from Last 3 Encounters:  06/26/17 138/78  03/25/17 (!) 159/84  03/07/17 (!) 142/70   Wt Readings from Last 3 Encounters:  06/26/17 192 lb 9.6 oz (87.4 kg)  03/25/17 195 lb 11.2 oz (88.8 kg)  03/07/17 200 lb (90.7 kg)    Physical Exam  Constitutional: No distress.  HENT:  Head: Normocephalic and atraumatic.  Mouth/Throat: Oropharynx is clear and moist.  No tenderness of her bilateral temples  Eyes: Pupils are equal, round, and reactive to light. Conjunctivae are normal.  Cardiovascular: Normal rate, regular rhythm and  normal heart sounds.  Pulmonary/Chest: Effort normal and breath sounds normal.  Abdominal: Soft. Bowel sounds are normal. She exhibits no distension. There is no tenderness. There is no rebound and no guarding.  Musculoskeletal: She exhibits no edema.  Slight tenderness over the left SI joint, no overlying skin changes  Neurological: She is alert.  CN 2-12 intact, 5/5 strength in bilateral biceps, triceps, grip, quads, hamstrings, plantar and dorsiflexion, sensation to light touch intact in bilateral UE and LE, normal gait  Skin: Skin is warm and dry. She is not diaphoretic.  Left inner upper bicep with very small area of prior tick bite with no signs of infection, no signs of retained tick products     Assessment/Plan: Please see individual problem list.  Tick bite of right upper arm Patient symptoms are somewhat nonspecific though in the setting of a tick bite previously she warrants treatment for tickborne illness.  Symptoms could be related to tickborne illness versus UTI versus other viral illness.  She has a fairly benign exam with only mild discomfort on palpation of her left SI joint which could be muscular strain related.  She will be treated with doxycycline.  We will check RMSF titers.  Urinalysis is concerning for UTI and thus we will treat with Keflex.  Will  send for culture and microscopy.  She is given return precautions.  Dysuria UA concerning for UTI.  Will treat with Keflex.  Sent for culture and microscopy.  Orders Placed This Encounter  Procedures  . Urine Culture  . Rocky mtn spotted fvr abs pnl(IgG+IgM)  . Urine Microscopic Only  . POCT Urinalysis Dipstick    Meds ordered this encounter  Medications  . doxycycline (VIBRA-TABS) 100 MG tablet    Sig: Take 1 tablet (100 mg total) by mouth 2 (two) times daily.    Dispense:  14 tablet    Refill:  0  . cephALEXin (KEFLEX) 500 MG capsule    Sig: Take 1 capsule (500 mg total) by mouth 2 (two) times daily.     Dispense:  14 capsule    Refill:  0     Tommi Rumps, MD [place]

## 2017-06-27 LAB — URINALYSIS, MICROSCOPIC ONLY

## 2017-06-28 LAB — ROCKY MTN SPOTTED FVR ABS PNL(IGG+IGM)
RMSF IgG: NOT DETECTED
RMSF IgM: NOT DETECTED

## 2017-06-29 LAB — URINE CULTURE
MICRO NUMBER:: 90501427
SPECIMEN QUALITY:: ADEQUATE

## 2017-07-02 ENCOUNTER — Telehealth: Payer: Self-pay | Admitting: Internal Medicine

## 2017-07-02 NOTE — Telephone Encounter (Signed)
Patient was seen by Dr. Caryl Bis 06-28-2017

## 2017-07-02 NOTE — Telephone Encounter (Signed)
Notified Aky[place] of results and medication

## 2017-07-02 NOTE — Telephone Encounter (Signed)
Copied from [place] [id number]. Topic: Inquiry >> Jul 02, 2017 12:18 PM Margot Ables wrote: Pt states she was supposed to get a call when the culture results come back in. Pt has 2 pills left. She still has burning with urination, pain in back. Per lab results were waiting on sensitivities.   [id number] - [place], [place] - [address] [phone] (Phone) [phone] (Fax)

## 2017-07-02 NOTE — Telephone Encounter (Signed)
I sent her a mychart message about the results. Her sensitivities came back and the bacteria was responsive to the antibiotic she was placed on. If she is still having symptoms she should be rechecked with myself or Dr McLean-Scocuzza. Thanks.

## 2017-07-02 NOTE — Telephone Encounter (Signed)
Please advise 

## 2017-07-02 NOTE — Telephone Encounter (Signed)
Left message to return call. [place] for pec to speak to patient and inform her of message below

## 2017-07-03 ENCOUNTER — Ambulatory Visit (INDEPENDENT_AMBULATORY_CARE_PROVIDER_SITE_OTHER): Payer: Medicare HMO | Admitting: Internal Medicine

## 2017-07-03 ENCOUNTER — Encounter: Payer: Self-pay | Admitting: Internal Medicine

## 2017-07-03 VITALS — BP 134/80 | HR 78 | Temp 97.6°F | Ht 65.0 in | Wt 197.6 lb

## 2017-07-03 DIAGNOSIS — N3001 Acute cystitis with hematuria: Secondary | ICD-10-CM | POA: Diagnosis not present

## 2017-07-03 DIAGNOSIS — N390 Urinary tract infection, site not specified: Secondary | ICD-10-CM | POA: Diagnosis not present

## 2017-07-03 DIAGNOSIS — Z1231 Encounter for screening mammogram for malignant neoplasm of breast: Secondary | ICD-10-CM

## 2017-07-03 MED ORDER — CIPROFLOXACIN HCL 500 MG PO TABS: 500.0000 mg | ORAL_TABLET | Freq: Two times a day (BID) | ORAL | 0 refills | Status: DC

## 2017-07-03 NOTE — Patient Instructions (Addendum)
Please drop urine off on 07/09/17  Call Friday  Follow up in 1-2 months   Urinary Tract Infection, Adult A urinary tract infection (UTI) is an infection of any part of the urinary tract. The urinary tract includes the:  Kidneys.  Ureters.  Bladder.  Urethra.  These organs make, store, and get rid of pee (urine) in the body. Follow these instructions at home:  Take over-the-counter and prescription medicines only as told by your doctor.  If you were prescribed an antibiotic medicine, take it as told by your doctor. Do not stop taking the antibiotic even if you start to feel better.  Avoid the following drinks: ? Alcohol. ? Caffeine. ? Tea. ? Carbonated drinks.  Drink enough fluid to keep your pee clear or pale yellow.  Keep all follow-up visits as told by your doctor. This is important.  Make sure to: ? Empty your bladder often and completely. Do not to hold pee for long periods of time. ? Empty your bladder before and after sex. ? Wipe from front to back after a bowel movement if you are female. Use each tissue one time when you wipe. Contact a doctor if:  You have back pain.  You have a fever.  You feel sick to your stomach (nauseous).  You throw up (vomit).  Your symptoms do not get better after 3 days.  Your symptoms go away and then come back. Get help right away if:  You have very bad back pain.  You have very bad lower belly (abdominal) pain.  You are throwing up and cannot keep down any medicines or water. This information is not intended to replace advice given to you by your health care provider. Make sure you discuss any questions you have with your health care provider. Document Released: 08/08/2007 Document Revised: 07/28/2015 Document Reviewed: 01/10/2015 Elsevier Interactive Patient Education  [employer].

## 2017-07-03 NOTE — Progress Notes (Signed)
Pre visit review using our clinic review tool, if applicable. No additional management support is needed unless otherwise documented below in the visit note. 

## 2017-07-03 NOTE — Progress Notes (Signed)
Chief Complaint  Patient presents with  . Follow-up  . Urinary Tract Infection   F/u  UTI with left lower back pain and burning with urination not better with keflex urine 06/26/17 +E coli UTI and also had E coli 03/2017 with urology that was not tx'ed with Abx. She reports she feels back and not better so made another f/u    Review of Systems  Constitutional: Negative for chills, fever and weight loss.  HENT: Negative for hearing loss.   Eyes: Negative for blurred vision.  Respiratory: Negative for shortness of breath.   Cardiovascular: Negative for chest pain.  Gastrointestinal: Positive for abdominal pain.       Lower ab pain  Genitourinary: Positive for dysuria and flank pain.   Past Medical History:  Diagnosis Date  . Arthritis   . Basal cell carcinoma    right temple Dr. Elmer Ramp removed 01/2017   . Chicken pox   . Emphysema of lung ([place])    h/o bronchitis not emphysema  . Fatty liver    09/19/15  . Gastric ulcer    neg H pylori   . GERD (gastroesophageal reflux disease)   . Gout   . Hiatal hernia   . Hyperlipidemia   . Hypertension   . Kidney stone    noted CT ab/pelvis 09/19/15 left kidney   . Recurrent UTI   . Syncope    per pt presyncope no LOC felt dizzy/vision distorted and couldnt speak EEG neg, CT head neg 07/31/16, f/u [facility]ology with US carotid, echo but no Holter   . Umbilical hernia    noted 09/19/15   . UTI (urinary tract infection)    Past Surgical History:  Procedure Laterality Date  . BREAST BIOPSY Right 1980   cyst removed negative pathology   . JOINT REPLACEMENT     right knee 11/2015 Dr. Janell Quiet [facility]   Family History  Problem Relation Age of Onset  . Breast cancer Mother 71  . Cancer Mother        breast cancer s/p masectomy lived to be 62   . Hypertension Mother   . Cancer Father        NMSC skin cancer   . Gout Father   . Heart disease Father        s/p bypass surgery age 55 lived 5 months after surgery   . Kidney  disease Father        RAS>kidney failure   . Hyperlipidemia Other   . Stroke Other   . Kidney cancer Neg Hx   . Bladder Cancer Neg Hx   . Prolactinoma Neg Hx    Social History   Socioeconomic History  . Marital status: Widowed    Spouse name: Not on file  . Number of children: Not on file  . Years of education: Not on file  . Highest education level: Not on file  Occupational History  . Not on file  Social Needs  . Financial resource strain: Not on file  . Food insecurity:    Worry: Not on file    Inability: Not on file  . Transportation needs:    Medical: Not on file    Non-medical: Not on file  Tobacco Use  . Smoking status: Former Research scientist (life sciences)  . Smokeless tobacco: Never Used  . Tobacco comment: smoked total 30 years quit 20 years ago from 2019 max 1 ppd no FH lung cancer   Substance and Sexual Activity  .  Alcohol use: No  . Drug use: Not on file  . Sexual activity: Not on file  Lifestyle  . Physical activity:    Days per week: Not on file    Minutes per session: Not on file  . Stress: Not on file  Relationships  . Social connections:    Talks on phone: Not on file    Gets together: Not on file    Attends religious service: Not on file    Active member of club or organization: Not on file    Attends meetings of clubs or organizations: Not on file    Relationship status: Not on file  . Intimate partner violence:    Fear of current or ex partner: Not on file    Emotionally abused: Not on file    Physically abused: Not on file    Forced sexual activity: Not on file  Other Topics Concern  . Not on file  Social History Narrative   2 years college    Retired Customer service manager    Current Meds  Medication Sig  . acetaminophen (TYLENOL) 500 MG tablet Take 1,000 mg by mouth 2 (two) times daily.   Marland Kitchen allopurinol (ZYLOPRIM) 300 MG tablet Take 1 tablet (300 mg total) by mouth daily.  Marland Kitchen atorvastatin (LIPITOR) 20 MG tablet Take 1 tablet (20 mg total) by mouth daily at 6 PM.  .  calcium carbonate 1250 MG capsule Take 1,250 mg by mouth 2 (two) times daily with a meal.  . Cholecalciferol (VITAMIN D3) 5000 units CAPS Take by mouth daily.  . Cyanocobalamin (B-12) 5000 MCG CAPS Take by mouth daily.  Marland Kitchen estradiol (ESTRACE VAGINAL) 0.1 MG/GM vaginal cream Apply 0.5mg  (pea-sized amount)  just inside the vaginal introitus with a finger-tip on Monday, Wednesday and Friday nights.  . Magnesium 500 MG CAPS Take 1 tablet by mouth at bedtime.  . pantoprazole (PROTONIX) 40 MG tablet Take 1 tablet (40 mg total) by mouth daily.  . potassium chloride (MICRO-K) 10 MEQ CR capsule Take 1 capsule (10 mEq total) by mouth 2 (two) times daily.  . traMADol (ULTRAM) 50 MG tablet Take 1 tablet (50 mg total) by mouth 2 (two) times daily as needed.  . triamterene-hydrochlorothiazide (MAXZIDE-25) 37.5-25 MG tablet Take 1 tablet by mouth daily.   Allergies  Allergen Reactions  . Nortriptyline Nausea Only  . Sulfa Antibiotics Rash   Recent Results (from the past 2160 hour(s))  Rocky mtn spotted fvr abs pnl(IgG+IgM)     Status: None   Collection Time: 06/26/17  3:49 PM  Result Value Ref Range   RMSF IgG NOT DETECTED NOT DETECT   RMSF IgM NOT DETECTED NOT DETECT  POCT Urinalysis Dipstick     Status: Abnormal   Collection Time: 06/26/17  4:33 PM  Result Value Ref Range   Color, UA yellow    Clarity, UA cloudy    Glucose, UA negative    Bilirubin, UA negative    Ketones, UA negative    Spec Grav, UA 1.020 1.010 - 1.025   Blood, UA positive    pH, UA 6.0 5.0 - 8.0   Protein, UA positive    Urobilinogen, UA 0.2 0.2 or 1.0 E.U./dL   Nitrite, UA positive    Leukocytes, UA Large (3+) (A) Negative   Appearance     Odor    Urine Culture     Status: Abnormal   Collection Time: 06/26/17  4:41 PM  Result Value Ref Range   MICRO NUMBER: [id number]  SPECIMEN QUALITY: ADEQUATE    Sample Source NOT GIVEN    STATUS: FINAL    ISOLATE 1: Escherichia coli (A)     Comment: Greater than 100,000 CFU/mL  of Escherichia coli      Susceptibility   Escherichia coli - URINE CULTURE, REFLEX    AMOX/CLAVULANIC 8 Sensitive     AMPICILLIN 16 Intermediate     AMPICILLIN/SULBACTAM 4 Sensitive     CEFAZOLIN* <=4 Not Reportable      * For infections other than uncomplicated UTIcaused by E. coli, K. pneumoniae or P. mirabilis:Cefazolin is resistant if MIC > or = 8 mcg/mL.(Distinguishing susceptible versus intermediatefor isolates with MIC < or = 4 mcg/mL requiresadditional testing.)For uncomplicated UTI caused by E. coli,K. pneumoniae or P. mirabilis: Cefazolin issusceptible if MIC <32 mcg/mL and predictssusceptible to the oral agents cefaclor, cefdinir,cefpodoxime, cefprozil, cefuroxime, cephalexinand loracarbef.    CEFEPIME <=1 Sensitive     CEFTRIAXONE <=1 Sensitive     CIPROFLOXACIN <=0.25 Sensitive     LEVOFLOXACIN <=0.12 Sensitive     ERTAPENEM <=0.5 Sensitive     GENTAMICIN <=1 Sensitive     IMIPENEM <=0.25 Sensitive     NITROFURANTOIN 64 Intermediate     PIP/TAZO <=4 Sensitive     TOBRAMYCIN <=1 Sensitive     TRIMETH/SULFA* <=20 Sensitive      * For infections other than uncomplicated UTIcaused by E. coli, K. pneumoniae or P. mirabilis:Cefazolin is resistant if MIC > or = 8 mcg/mL.(Distinguishing susceptible versus intermediatefor isolates with MIC < or = 4 mcg/mL requiresadditional testing.)For uncomplicated UTI caused by E. coli,K. pneumoniae or P. mirabilis: Cefazolin issusceptible if MIC <32 mcg/mL and predictssusceptible to the oral agents cefaclor, cefdinir,cefpodoxime, cefprozil, cefuroxime, cephalexinand loracarbef.Legend:S = Susceptible  I = IntermediateR = Resistant  NS = Not susceptible* = Not tested  NR = Not reported**NN = See antimicrobic comments  Urine Microscopic Only     Status: Abnormal   Collection Time: 06/26/17  4:41 PM  Result Value Ref Range   WBC, UA 21-50/hpf (A) 0-2/hpf   RBC / HPF 3-6/hpf (A) 0-2/hpf   Squamous Epithelial / LPF Few(5-10/hpf) (A) Rare(0-4/hpf)    Bacteria, UA Many(>50/hpf) (A) None   Objective  Body mass index is 32.88 kg/m. Wt Readings from Last 3 Encounters:  07/03/17 197 lb 9.6 oz (89.6 kg)  06/26/17 192 lb 9.6 oz (87.4 kg)  03/25/17 195 lb 11.2 oz (88.8 kg)   Temp Readings from Last 3 Encounters:  07/03/17 97.6 F (36.4 C) (Oral)  06/26/17 98.8 F (37.1 C) (Oral)  03/07/17 98.2 F (36.8 C) (Oral)   BP Readings from Last 3 Encounters:  07/03/17 134/80  06/26/17 138/78  03/25/17 (!) 159/84   Pulse Readings from Last 3 Encounters:  07/03/17 78  06/26/17 87  03/25/17 72    Physical Exam  Constitutional: She is oriented to person, place, and time. Vital signs are normal. She appears well-developed and well-nourished. She is cooperative.  HENT:  Head: Normocephalic and atraumatic.  Mouth/Throat: Oropharynx is clear and moist and mucous membranes are normal.  Eyes: Pupils are equal, round, and reactive to light. Conjunctivae are normal.  Cardiovascular: Normal rate, regular rhythm and normal heart sounds.  Pulmonary/Chest: Effort normal and breath sounds normal.  Abdominal: Soft. Bowel sounds are normal. There is tenderness in the right lower quadrant, suprapubic area and left lower quadrant.  Mild ttp   Neurological: She is alert and oriented to person, place, and time. Gait normal.  Skin: Skin is warm, dry  and intact.  Psychiatric: She has a normal mood and affect. Her speech is normal and behavior is normal. Judgment and thought content normal. Cognition and memory are normal.  Nursing note and vitals reviewed.   Assessment   1. 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 HM Plan   1. Cipro bid x 5 days  ua and culture on 07/09/17 bring back  Dc keflex did not help  Disc increase water intake, OTC cranberry, AZO/pyridium  2.  UTD flu shot had 12/2016  ?last Tdap check [facility] and other vaccines  Pneumonia shots 2018 ? Date and which vaccine per pt had both  Had zostavax 2014 consider  shingrix in future   ? Date of last colonoscopy per pt she had in her 43s only 1x no h/o polyps had [facility] will get records  ? Date of last pap per pt no h/o abnormal pt Dr. Vickki Muff has record. Last I can see 11/21/01 consider 1 more if it has really been this long.  Mammogram 10/30/16 mammo rec screen in 1 year multiple benign left breasts cysts  -referred today  DEXA 04/03/12 osteopenia rec calcium 600 mg bid and on vit D3 5000 qd. Vit D 37 10/02/16   Former smoker total 30 years max 1 ppd no FH lung cancer quit 20 years ago from 2019   Hep C neg 04/19/16  Check hep B status today will disc rec hep B vaccine in future    Disc wt loss goal weight is 174 for pt she was this years ago.  See note 03/07/17    Provider: Dr. Olivia Mackie McLean-Scocuzza-Internal Medicine

## 2017-07-05 ENCOUNTER — Telehealth: Payer: Self-pay | Admitting: Internal Medicine

## 2017-07-05 NOTE — Telephone Encounter (Signed)
Please advise 

## 2017-07-05 NOTE — Telephone Encounter (Signed)
Copied from [place] [id number]. Topic: Quick Communication - See Telephone Encounter >> Jul 05, 2017  1:13 PM Rutherford Nail, NT wrote: CRM for notification. See Telephone encounter for: 07/05/17. Patient calling and leaving a message for Dr McLean-Scocuzza that she is feeling better. States that she is not hurting in her kidney area as bad and there is not a lot of burning when she urinates. States she will be by on Tuesday (07/09/17) to give a urine sample.

## 2017-07-05 NOTE — Telephone Encounter (Signed)
FYI

## 2017-07-09 ENCOUNTER — Other Ambulatory Visit (INDEPENDENT_AMBULATORY_CARE_PROVIDER_SITE_OTHER): Payer: Medicare HMO

## 2017-07-09 DIAGNOSIS — N3001 Acute cystitis with hematuria: Secondary | ICD-10-CM | POA: Diagnosis not present

## 2017-07-09 LAB — URINALYSIS, ROUTINE W REFLEX MICROSCOPIC
Bilirubin Urine: NEGATIVE
Ketones, ur: NEGATIVE
Leukocytes, UA: NEGATIVE
Nitrite: NEGATIVE
Specific Gravity, Urine: 1.015 (ref 1.000–1.030)
Total Protein, Urine: NEGATIVE
Urine Glucose: NEGATIVE
Urobilinogen, UA: 0.2 (ref 0.0–1.0)
pH: 6.5 (ref 5.0–8.0)

## 2017-07-09 NOTE — Addendum Note (Signed)
Addended by: Arby Barrette on: 07/09/2017 10:42 AM   Modules accepted: Orders

## 2017-07-10 LAB — URINE CULTURE
MICRO NUMBER:: 90555260
Result:: NO GROWTH
SPECIMEN QUALITY:: ADEQUATE

## 2017-07-22 DIAGNOSIS — Z85828 Personal history of other malignant neoplasm of skin: Secondary | ICD-10-CM | POA: Diagnosis not present

## 2017-07-22 DIAGNOSIS — Z872 Personal history of diseases of the skin and subcutaneous tissue: Secondary | ICD-10-CM | POA: Diagnosis not present

## 2017-07-22 DIAGNOSIS — D485 Neoplasm of uncertain behavior of skin: Secondary | ICD-10-CM | POA: Diagnosis not present

## 2017-07-22 DIAGNOSIS — L570 Actinic keratosis: Secondary | ICD-10-CM | POA: Diagnosis not present

## 2017-07-22 DIAGNOSIS — L578 Other skin changes due to chronic exposure to nonionizing radiation: Secondary | ICD-10-CM | POA: Diagnosis not present

## 2017-07-22 DIAGNOSIS — Z86018 Personal history of other benign neoplasm: Secondary | ICD-10-CM | POA: Diagnosis not present

## 2017-08-05 ENCOUNTER — Ambulatory Visit: Payer: Self-pay | Admitting: Internal Medicine

## 2017-09-02 ENCOUNTER — Ambulatory Visit (INDEPENDENT_AMBULATORY_CARE_PROVIDER_SITE_OTHER): Payer: Medicare HMO

## 2017-09-02 ENCOUNTER — Encounter: Payer: Self-pay | Admitting: Internal Medicine

## 2017-09-02 ENCOUNTER — Ambulatory Visit (INDEPENDENT_AMBULATORY_CARE_PROVIDER_SITE_OTHER): Payer: Medicare HMO | Admitting: Internal Medicine

## 2017-09-02 VITALS — BP 124/76 | HR 91 | Temp 98.2°F | Ht 65.0 in | Wt 212.6 lb

## 2017-09-02 DIAGNOSIS — G8929 Other chronic pain: Secondary | ICD-10-CM | POA: Diagnosis not present

## 2017-09-02 DIAGNOSIS — M5440 Lumbago with sciatica, unspecified side: Secondary | ICD-10-CM | POA: Diagnosis not present

## 2017-09-02 DIAGNOSIS — Z85828 Personal history of other malignant neoplasm of skin: Secondary | ICD-10-CM | POA: Diagnosis not present

## 2017-09-02 DIAGNOSIS — M5136 Other intervertebral disc degeneration, lumbar region: Secondary | ICD-10-CM | POA: Diagnosis not present

## 2017-09-02 DIAGNOSIS — Z1283 Encounter for screening for malignant neoplasm of skin: Secondary | ICD-10-CM | POA: Diagnosis not present

## 2017-09-02 DIAGNOSIS — M25562 Pain in left knee: Secondary | ICD-10-CM

## 2017-09-02 DIAGNOSIS — M1712 Unilateral primary osteoarthritis, left knee: Secondary | ICD-10-CM | POA: Diagnosis not present

## 2017-09-02 MED ORDER — METHOCARBAMOL 500 MG PO TABS: 500.0000 mg | ORAL_TABLET | Freq: Every evening | ORAL | 2 refills | Status: DC | PRN

## 2017-09-02 MED ORDER — GABAPENTIN 100 MG PO CAPS: 100.0000 mg | ORAL_CAPSULE | Freq: Every day | ORAL | 2 refills | Status: DC

## 2017-09-02 MED ORDER — TRAMADOL HCL 50 MG PO TABS: 50.0000 mg | ORAL_TABLET | Freq: Three times a day (TID) | ORAL | 2 refills | Status: DC | PRN

## 2017-09-02 MED ORDER — METHYLPREDNISOLONE ACETATE 40 MG/ML IJ SUSP
40.0000 mg | Freq: Once | INTRAMUSCULAR | Status: AC
  Administered 2017-09-02: 40 mg via INTRAMUSCULAR

## 2017-09-02 NOTE — Patient Instructions (Addendum)
Dr. Marry Guan is the best orthopedic in this area  Dr. Karle Barr [facility] [place] [phone]  F/u in 3 months sooner if needed      Knee Pain, Adult Knee pain in adults is common. It can be caused by many things, including:  Arthritis.  A fluid-filled sac (cyst) or growth in your knee.  An infection in your knee.  An injury that will not heal.  Damage, swelling, or irritation of the tissues that support your knee.  Knee pain is usually not a sign of a serious problem. The pain may go away on its own with time and rest. If it does not, a health care provider may order tests to find the cause of the pain. These may include:  Imaging tests, such as an X-ray, MRI, or ultrasound.  Joint aspiration. In this test, fluid is removed from the knee.  Arthroscopy. In this test, a lighted tube is inserted into knee and an image is projected onto a TV screen.  A biopsy. In this test, a sample of tissue is removed from the body and studied under a microscope.  Follow these instructions at home: Pay attention to any changes in your symptoms. Take these actions to relieve your pain. Activity  Rest your knee.  Do not do things that cause pain or make pain worse.  Avoid high-impact activities or exercises, such as running, jumping rope, or doing jumping jacks. General instructions  Take over-the-counter and prescription medicines only as told by your health care provider.  Raise (elevate) your knee above the level of your heart when you are sitting or lying down.  Sleep with a pillow under your knee.  If directed, apply ice to the knee: ? Put ice in a plastic bag. ? Place a towel between your skin and the bag. ? Leave the ice on for 20 minutes, 2-3 times a day.  Ask your health care provider if you should wear an elastic knee support.  Lose weight if you are overweight. Extra weight can put pressure on your knee.  Do not use any products that contain nicotine or  tobacco, such as cigarettes and e-cigarettes. Smoking may slow the healing of any bone and joint problems that you may have. If you need help quitting, ask your health care provider. Contact a health care provider if:  Your knee pain continues, changes, or gets worse.  You have a fever along with knee pain.  Your knee buckles or locks up.  Your knee swells, and the swelling becomes worse. Get help right away if:  Your knee feels warm to the touch.  You cannot move your knee.  You have severe pain in your knee.  You have chest pain.  You have trouble breathing. Summary  Knee pain in adults is common. It can be caused by many things, including, arthritis, infection, cysts, or injury.  Knee pain is usually not a sign of a serious problem, but if it does not go away, a health care provider may perform tests to know the cause of the pain.  Pay attention to any changes in your symptoms. Relieve your pain with rest, medicines, light activity, and use of ice.  Get help if your pain continues or becomes very severe, or if your knee buckles or locks up, or if you have chest pain or trouble breathing. This information is not intended to replace advice given to you by your health care provider. Make sure you discuss any questions  you have with your health care provider. Document Released: 12/17/2006 Document Revised: 02/10/2016 Document Reviewed: 02/10/2016 Elsevier Interactive Patient Education  2018 [employer].  Back Pain, Adult Many adults have back pain from time to time. Common causes of back pain include:  A strained muscle or ligament.  Wear and tear (degeneration) of the spinal disks.  Arthritis.  A hit to the back.  Back pain can be short-lived (acute) or last a long time (chronic). A physical exam, lab tests, and imaging studies may be done to find the cause of your pain. Follow these instructions at home: Managing pain and stiffness  Take over-the-counter and  prescription medicines only as told by your health care provider.  If directed, apply heat to the affected area as often as told by your health care provider. Use the heat source that your health care provider recommends, such as a moist heat pack or a heating pad. ? Place a towel between your skin and the heat source. ? Leave the heat on for 20-30 minutes. ? Remove the heat if your skin turns bright red. This is especially important if you are unable to feel pain, heat, or cold. You have a greater risk of getting burned.  If directed, apply ice to the injured area: ? Put ice in a plastic bag. ? Place a towel between your skin and the bag. ? Leave the ice on for 20 minutes, 2-3 times a day for the first 2-3 days. Activity  Do not stay in bed. Resting more than 1-2 days can delay your recovery.  Take short walks on even surfaces as soon as you are able. Try to increase the length of time you walk each day.  Do not sit, drive, or stand in one place for more than 30 minutes at a time. Sitting or standing for long periods of time can put stress on your back.  Use proper lifting techniques. When you bend and lift, use positions that put less stress on your back: ? Welby your knees. ? Keep the load close to your body. ? Avoid twisting.  Exercise regularly as told by your health care provider. Exercising will help your back heal faster. This also helps prevent back injuries by keeping muscles strong and flexible.  Your health care provider may recommend that you see a physical therapist. This person can help you come up with a safe exercise program. Do any exercises as told by your physical therapist. Lifestyle  Maintain a healthy weight. Extra weight puts stress on your back and makes it difficult to have good posture.  Avoid activities or situations that make you feel anxious or stressed. Learn ways to manage anxiety and stress. One way to manage stress is through exercise. Stress and  anxiety increase muscle tension and can make back pain worse. General instructions  Sleep on a firm mattress in a comfortable position. Try lying on your side with your knees slightly bent. If you lie on your back, put a pillow under your knees.  Follow your treatment plan as told by your health care provider. This may include: ? Cognitive or behavioral therapy. ? Acupuncture or massage therapy. ? Meditation or yoga. Contact a health care provider if:  You have pain that is not relieved with rest or medicine.  You have increasing pain going down into your legs or buttocks.  Your pain does not improve in 2 weeks.  You have pain at night.  You lose weight.  You have a fever  or chills. Get help right away if:  You develop new bowel or bladder control problems.  You have unusual weakness or numbness in your arms or legs.  You develop nausea or vomiting.  You develop abdominal pain.  You feel faint. Summary  Many adults have back pain from time to time. A physical exam, lab tests, and imaging studies may be done to find the cause of your pain.  Use proper lifting techniques. When you bend and lift, use positions that put less stress on your back.  Take over-the-counter and prescription medicines and apply heat or ice as directed by your health care provider. This information is not intended to replace advice given to you by your health care provider. Make sure you discuss any questions you have with your health care provider. Document Released: 02/19/2005 Document Revised: 03/26/2016 Document Reviewed: 03/26/2016 Elsevier Interactive Patient Education  [employer].

## 2017-09-02 NOTE — Progress Notes (Signed)
Pre visit review using our clinic review tool, if applicable. No additional management support is needed unless otherwise documented below in the visit note. 

## 2017-09-02 NOTE — Progress Notes (Signed)
Chief Complaint  Patient presents with  . Back Pain   Back pain  1. Worse since recently bending a lot cleaning out garage h/o bulging disc with h/o 3 epidural injections and seeing chiropractor. She takes tramadol 50 mg bid at times and tylenol but running out of Rx. No bowel/bladder incontinence or leg weakness or numbness. Back pain 10/10 radiates down left side. Flared x >1 month ago. She has seen [facility] in [place] in the past  Left knee pain  2. C/o left knee pain and inside left knee swollen. She has had surgery on right knee years ago and told needed left knee in the futuer.   Review of Systems  Constitutional: Negative for weight loss.  Respiratory: Negative for shortness of breath.   Cardiovascular: Negative for chest pain.  Musculoskeletal: Positive for back pain and joint pain.  Psychiatric/Behavioral: Negative for memory loss.   Past Medical History:  Diagnosis Date  . Arthritis   . Basal cell carcinoma    right temple Dr. Elmer Ramp removed 01/2017   . Chicken pox   . Emphysema of lung ([place])    h/o bronchitis not emphysema  . Fatty liver    09/19/15  . Gastric ulcer    neg H pylori   . GERD (gastroesophageal reflux disease)   . Gout   . Hiatal hernia   . Hyperlipidemia   . Hypertension   . Kidney stone    noted CT ab/pelvis 09/19/15 left kidney   . Recurrent UTI   . Syncope    per pt presyncope no LOC felt dizzy/vision distorted and couldnt speak EEG neg, CT head neg 07/31/16, f/u [facility] with US carotid, echo but no Holter   . Umbilical hernia    noted 09/19/15   . UTI (urinary tract infection)    Past Surgical History:  Procedure Laterality Date  . BREAST BIOPSY Right 1980   cyst removed negative pathology   . JOINT REPLACEMENT     right knee 11/2015 Dr. Janell Quiet [facility]   Family History  Problem Relation Age of Onset  . Breast cancer Mother 65  . Cancer Mother        breast cancer s/p masectomy lived to be 78   .  Hypertension Mother   . Cancer Father        NMSC skin cancer   . Gout Father   . Heart disease Father        s/p bypass surgery age 4 lived 5 months after surgery   . Kidney disease Father        RAS>kidney failure   . Hyperlipidemia Other   . Stroke Other   . Kidney cancer Neg Hx   . Bladder Cancer Neg Hx   . Prolactinoma Neg Hx    Social History   Socioeconomic History  . Marital status: Widowed    Spouse name: Not on file  . Number of children: Not on file  . Years of education: Not on file  . Highest education level: Not on file  Occupational History  . Not on file  Social Needs  . Financial resource strain: Not on file  . Food insecurity:    Worry: Not on file    Inability: Not on file  . Transportation needs:    Medical: Not on file    Non-medical: Not on file  Tobacco Use  . Smoking status: Former Research scientist (life sciences)  . Smokeless tobacco: Never Used  . Tobacco comment:  smoked total 30 years quit 20 years ago from 2019 max 1 ppd no FH lung cancer   Substance and Sexual Activity  . Alcohol use: No  . Drug use: Not on file  . Sexual activity: Not on file  Lifestyle  . Physical activity:    Days per week: Not on file    Minutes per session: Not on file  . Stress: Not on file  Relationships  . Social connections:    Talks on phone: Not on file    Gets together: Not on file    Attends religious service: Not on file    Active member of club or organization: Not on file    Attends meetings of clubs or organizations: Not on file    Relationship status: Not on file  . Intimate partner violence:    Fear of current or ex partner: Not on file    Emotionally abused: Not on file    Physically abused: Not on file    Forced sexual activity: Not on file  Other Topics Concern  . Not on file  Social History Narrative   2 years college    Retired Customer service manager    Current Meds  Medication Sig  . acetaminophen (TYLENOL) 500 MG tablet Take 1,000 mg by mouth 2 (two) times daily.   Marland Kitchen  allopurinol (ZYLOPRIM) 300 MG tablet Take 1 tablet (300 mg total) by mouth daily.  Marland Kitchen atorvastatin (LIPITOR) 20 MG tablet Take 1 tablet (20 mg total) by mouth daily at 6 PM.  . calcium carbonate 1250 MG capsule Take 1,250 mg by mouth 2 (two) times daily with a meal.  . Cholecalciferol (VITAMIN D3) 5000 units CAPS Take by mouth daily.  . Cyanocobalamin (B-12) 5000 MCG CAPS Take by mouth daily.  Marland Kitchen estradiol (ESTRACE VAGINAL) 0.1 MG/GM vaginal cream Apply 0.77m (pea-sized amount)  just inside the vaginal introitus with a finger-tip on Monday, Wednesday and Friday nights.  . Magnesium 500 MG CAPS Take 1 tablet by mouth at bedtime.  . pantoprazole (PROTONIX) 40 MG tablet Take 1 tablet (40 mg total) by mouth daily.  . potassium chloride (MICRO-K) 10 MEQ CR capsule Take 1 capsule (10 mEq total) by mouth 2 (two) times daily.  . traMADol (ULTRAM) 50 MG tablet Take 1 tablet (50 mg total) by mouth 3 (three) times daily as needed.  . triamterene-hydrochlorothiazide (MAXZIDE-25) 37.5-25 MG tablet Take 1 tablet by mouth daily.  . [DISCONTINUED] traMADol (ULTRAM) 50 MG tablet Take 1 tablet (50 mg total) by mouth 2 (two) times daily as needed.   Allergies  Allergen Reactions  . Nortriptyline Nausea Only  . Sulfa Antibiotics Rash   Recent Results (from the past 2160 hour(s))  Rocky mtn spotted fvr abs pnl(IgG+IgM)     Status: None   Collection Time: 06/26/17  3:49 PM  Result Value Ref Range   RMSF IgG NOT DETECTED NOT DETECT   RMSF IgM NOT DETECTED NOT DETECT  POCT Urinalysis Dipstick     Status: Abnormal   Collection Time: 06/26/17  4:33 PM  Result Value Ref Range   Color, UA yellow    Clarity, UA cloudy    Glucose, UA negative    Bilirubin, UA negative    Ketones, UA negative    Spec Grav, UA 1.020 1.010 - 1.025   Blood, UA positive    pH, UA 6.0 5.0 - 8.0   Protein, UA positive    Urobilinogen, UA 0.2 0.2 or 1.0 E.U./dL   Nitrite, UA  positive    Leukocytes, UA Large (3+) (A) Negative    Appearance     Odor    Urine Culture     Status: Abnormal   Collection Time: 06/26/17  4:41 PM  Result Value Ref Range   MICRO NUMBER: [id number]    SPECIMEN QUALITY: ADEQUATE    Sample Source NOT GIVEN    STATUS: FINAL    ISOLATE 1: Escherichia coli (A)     Comment: Greater than 100,000 CFU/mL of Escherichia coli      Susceptibility   Escherichia coli - URINE CULTURE, REFLEX    AMOX/CLAVULANIC 8 Sensitive     AMPICILLIN 16 Intermediate     AMPICILLIN/SULBACTAM 4 Sensitive     CEFAZOLIN* <=4 Not Reportable      * For infections other than uncomplicated UTIcaused by E. coli, K. pneumoniae or P. mirabilis:Cefazolin is resistant if MIC > or = 8 mcg/mL.(Distinguishing susceptible versus intermediatefor isolates with MIC < or = 4 mcg/mL requiresadditional testing.)For uncomplicated UTI caused by E. coli,K. pneumoniae or P. mirabilis: Cefazolin issusceptible if MIC <32 mcg/mL and predictssusceptible to the oral agents cefaclor, cefdinir,cefpodoxime, cefprozil, cefuroxime, cephalexinand loracarbef.    CEFEPIME <=1 Sensitive     CEFTRIAXONE <=1 Sensitive     CIPROFLOXACIN <=0.25 Sensitive     LEVOFLOXACIN <=0.12 Sensitive     ERTAPENEM <=0.5 Sensitive     GENTAMICIN <=1 Sensitive     IMIPENEM <=0.25 Sensitive     NITROFURANTOIN 64 Intermediate     PIP/TAZO <=4 Sensitive     TOBRAMYCIN <=1 Sensitive     TRIMETH/SULFA* <=20 Sensitive      * For infections other than uncomplicated UTIcaused by E. coli, K. pneumoniae or P. mirabilis:Cefazolin is resistant if MIC > or = 8 mcg/mL.(Distinguishing susceptible versus intermediatefor isolates with MIC < or = 4 mcg/mL requiresadditional testing.)For uncomplicated UTI caused by E. coli,K. pneumoniae or P. mirabilis: Cefazolin issusceptible if MIC <32 mcg/mL and predictssusceptible to the oral agents cefaclor, cefdinir,cefpodoxime, cefprozil, cefuroxime, cephalexinand loracarbef.Legend:S = Susceptible  I = IntermediateR = Resistant  NS = Not susceptible* = Not  tested  NR = Not reported**NN = See antimicrobic comments  Urine Microscopic Only     Status: Abnormal   Collection Time: 06/26/17  4:41 PM  Result Value Ref Range   WBC, UA 21-50/hpf (A) 0-2/hpf   RBC / HPF 3-6/hpf (A) 0-2/hpf   Squamous Epithelial / LPF Few(5-10/hpf) (A) Rare(0-4/hpf)   Bacteria, UA Many(>50/hpf) (A) None  Urinalysis, Routine w reflex microscopic     Status: Abnormal   Collection Time: 07/09/17 10:42 AM  Result Value Ref Range   Color, Urine YELLOW Yellow;Lt. Yellow   APPearance CLEAR Clear   Specific Gravity, Urine 1.015 1.000 - 1.030   pH 6.5 5.0 - 8.0   Total Protein, Urine NEGATIVE Negative   Urine Glucose NEGATIVE Negative   Ketones, ur NEGATIVE Negative   Bilirubin Urine NEGATIVE Negative   Hgb urine dipstick TRACE-INTACT (A) Negative   Urobilinogen, UA 0.2 0.0 - 1.0   Leukocytes, UA NEGATIVE Negative   Nitrite NEGATIVE Negative   WBC, UA 0-2/hpf 0-2/hpf   RBC / HPF 7-10/hpf (A) 0-2/hpf  Urine Culture     Status: None   Collection Time: 07/09/17 10:42 AM  Result Value Ref Range   MICRO NUMBER: [id number]    SPECIMEN QUALITY: ADEQUATE    Sample Source NOT GIVEN    STATUS: FINAL    Result: No Growth    Objective  Body mass index  is 35.38 kg/m. Wt Readings from Last 3 Encounters:  09/02/17 212 lb 9.6 oz (96.4 kg)  07/03/17 197 lb 9.6 oz (89.6 kg)  06/26/17 192 lb 9.6 oz (87.4 kg)   Temp Readings from Last 3 Encounters:  09/02/17 98.2 F (36.8 C) (Oral)  07/03/17 97.6 F (36.4 C) (Oral)  06/26/17 98.8 F (37.1 C) (Oral)   BP Readings from Last 3 Encounters:  09/02/17 124/76  07/03/17 134/80  06/26/17 138/78   Pulse Readings from Last 3 Encounters:  09/02/17 91  07/03/17 78  06/26/17 87    Physical Exam  Constitutional: She is oriented to person, place, and time. Vital signs are normal. She appears well-developed and well-nourished. She is cooperative.  HENT:  Head: Normocephalic and atraumatic.  Mouth/Throat: Oropharynx is clear and  moist and mucous membranes are normal.  Eyes: Pupils are equal, round, and reactive to light. Conjunctivae are normal.  Cardiovascular: Normal rate, regular rhythm and normal heart sounds.  Pulmonary/Chest: Effort normal and breath sounds normal.  Musculoskeletal:       Left knee: Tenderness found. Medial joint line and patellar tendon tenderness noted.       Lumbar back: She exhibits tenderness.  +str8 leg test left   Neurological: She is alert and oriented to person, place, and time. Gait normal.  Skin: Skin is warm, dry and intact.  Psychiatric: She has a normal mood and affect. Her speech is normal and behavior is normal. Judgment and thought content normal. Cognition and memory are normal.  Nursing note and vitals reviewed.   Assessment   1. Lumbar radiculopathy, left  2. Left knee pain  3. hM  Plan  1.  Tramadol inc freq to 50 tid prn resign pain contract  Depo 40 x 1  Robaxin 500 qhs prn and gabapentin 100 mg qhs  Xray low back  2. Xray left knee see above  Disc Dr. Marry Guan today if needed in future  3.  Labs had 01/17/17 care everywhere CMET, CBC, lipid, TSH/T4 had 08/02/16 normal, UA had 07/09/17 normal, A1C 5.6 01/17/17 h/o prediabetes  UTD flu shot had 12/2016  Tdap 05/11/13  prevnar had 05/11/13, pna 12 had 04/09/16  Had zostavax 2014 consider shingrix in future   ? Date of last colonoscopy per pt she had in her 80s only 1x no h/o polyps had [facility]  -will get records  ? Date of last pap per pt no h/o abnormal pt per chart h/o abnormal pap Dr. Vickki Muff has record. Last I can see 11/21/01 consider 1 more if it has really been this long.  Mammogram 10/30/16 mammo rec screen in 1 year multiple benign left breasts cysts  DEXA 04/03/12 osteopenia rec calcium 600 mg bid and on vit D3 5000 qd. Vit D 37 10/02/16  -consider repeat DEXA in future   Former smoker total 30 years max 1 ppd no FH lung cancer quit 20 years ago from 2019  Hep B rec had 2/3 last rec 09/16/17. Also c/w fatty liver  had 1/2 hep a 3rd due 09/16/17 Consider check MMR in future  H/o skin cancer refer to Dr. Kellie Moor today nose lesion new not responding to chemo cream and right temple lesion prev bx'ed    Provider: Dr. Olivia Mackie [facility]

## 2017-09-03 ENCOUNTER — Encounter: Payer: Self-pay | Admitting: Internal Medicine

## 2017-09-03 ENCOUNTER — Encounter: Payer: Self-pay | Admitting: *Deleted

## 2017-09-03 DIAGNOSIS — Z85828 Personal history of other malignant neoplasm of skin: Secondary | ICD-10-CM | POA: Insufficient documentation

## 2017-09-03 DIAGNOSIS — M5440 Lumbago with sciatica, unspecified side: Secondary | ICD-10-CM | POA: Insufficient documentation

## 2017-09-04 ENCOUNTER — Telehealth: Payer: Self-pay | Admitting: *Deleted

## 2017-09-04 ENCOUNTER — Other Ambulatory Visit: Payer: Self-pay | Admitting: Internal Medicine

## 2017-09-04 DIAGNOSIS — G8929 Other chronic pain: Secondary | ICD-10-CM

## 2017-09-04 DIAGNOSIS — M25562 Pain in left knee: Principal | ICD-10-CM

## 2017-09-04 NOTE — Telephone Encounter (Signed)
No referral in for dr. Marry Guan to cancel

## 2017-09-04 NOTE — Telephone Encounter (Signed)
Referred Dr. Marry Guan left knee pain   [place]

## 2017-09-04 NOTE — Telephone Encounter (Signed)
FYI

## 2017-09-04 NOTE — Telephone Encounter (Signed)
Copied from [place] [phone]. Topic: General - Other >> Sep 04, 2017  9:55 AM Yvette Rack wrote: Reason for CRM: pt calling stating that Linus Orn wanted her to go see Dr Marry Guan about her knee but she states that she doesn't need to go see him now that she is much better she doesn't know if it was the steroids or muscle relaxer that worked so she doesn't want referral

## 2017-09-09 ENCOUNTER — Ambulatory Visit: Payer: Self-pay | Admitting: *Deleted

## 2017-09-09 ENCOUNTER — Other Ambulatory Visit: Payer: Self-pay | Admitting: Internal Medicine

## 2017-09-09 DIAGNOSIS — Z87442 Personal history of urinary calculi: Secondary | ICD-10-CM

## 2017-09-09 DIAGNOSIS — Z1329 Encounter for screening for other suspected endocrine disorder: Secondary | ICD-10-CM

## 2017-09-09 DIAGNOSIS — R10A2 Flank pain, left side: Secondary | ICD-10-CM

## 2017-09-09 DIAGNOSIS — Z87440 Personal history of urinary (tract) infections: Secondary | ICD-10-CM

## 2017-09-09 DIAGNOSIS — Z0184 Encounter for antibody response examination: Secondary | ICD-10-CM

## 2017-09-09 DIAGNOSIS — R109 Unspecified abdominal pain: Secondary | ICD-10-CM

## 2017-09-09 DIAGNOSIS — Z1159 Encounter for screening for other viral diseases: Secondary | ICD-10-CM

## 2017-09-09 NOTE — Telephone Encounter (Signed)
Pt reports left sided lower back/flank pain, onset this AM. Pain is constant 6-7/10. Burning with urination, frequency. Pt states she feels like she may be febrile, "Feel warm, chills at times." H/O recurrent UTIs. States "I've been doing everything Dr. Aundra Dubin has told me to do to prevent these." States this one feels a little different; "Came on suddenly." Denies hematuria, reports pressure at bladder area. No availability at practice. Pt directed to UC. States she will follow disposition.  Reason for Disposition . Side (flank) or lower back pain present  Answer Assessment - Initial Assessment Questions 1. SYMPTOM: "What's the main symptom you're concerned about?" (e.g., frequency, incontinence)     Frequency, lower left sided back pain 2. ONSET: "When did the  *No Answer*  start?"     This am 3. PAIN: "Is there any pain?" If so, ask: "How bad is it?" (Scale: 1-10; mild, moderate, severe)     6-7/10, constant 4. CAUSE: "What do you think is causing the symptoms?"     UTI 5. OTHER SYMPTOMS: "Do you have any other symptoms?" (e.g., fever, flank pain, blood in urine, pain with urination)     Burning with urination, pressure at bladder area, chills,"Feels warm"  Protocols used: URINARY [facility]

## 2017-09-09 NOTE — Telephone Encounter (Signed)
Cynthia Bryan  1. Please sch stat CT renal and urology appt with Dr. Erlene Quan urology   Thanks [facility]

## 2017-09-09 NOTE — Telephone Encounter (Signed)
She has h/o kidney stone on the left im going to order CT renal stat and urine and blood work   and  schedule ua and culture tomorrow as well as blood work Pt may need to f/u with urology as well rec Dr. Hollice Espy call to get appt for recurrent UTI and kidney stones    Thanks [place]

## 2017-09-09 NOTE — Telephone Encounter (Signed)
FYI

## 2017-09-09 NOTE — Telephone Encounter (Signed)
She has h/o kidney stone on the left im going to order CT renal stat  and  sch ua and culture tomorrow  Pt may need to f/u with urology as well rec Dr. Hollice Espy call to get appt for recurrent UTI and kidney stones    Thanks [place]

## 2017-09-10 ENCOUNTER — Ambulatory Visit
Admission: RE | Admit: 2017-09-10 | Discharge: 2017-09-10 | Disposition: A | Discharge status: Home / Self Care | Payer: Medicare HMO | Source: Ambulatory Visit | Attending: Internal Medicine | Admitting: Internal Medicine

## 2017-09-10 ENCOUNTER — Other Ambulatory Visit: Payer: Self-pay | Admitting: Internal Medicine

## 2017-09-10 DIAGNOSIS — R160 Hepatomegaly, not elsewhere classified: Secondary | ICD-10-CM | POA: Insufficient documentation

## 2017-09-10 DIAGNOSIS — K760 Fatty (change of) liver, not elsewhere classified: Secondary | ICD-10-CM | POA: Diagnosis not present

## 2017-09-10 DIAGNOSIS — Z87442 Personal history of urinary calculi: Secondary | ICD-10-CM | POA: Diagnosis not present

## 2017-09-10 DIAGNOSIS — R10A2 Flank pain, left side: Secondary | ICD-10-CM

## 2017-09-10 DIAGNOSIS — N2889 Other specified disorders of kidney and ureter: Secondary | ICD-10-CM | POA: Insufficient documentation

## 2017-09-10 DIAGNOSIS — I700 Atherosclerosis of aorta: Secondary | ICD-10-CM | POA: Insufficient documentation

## 2017-09-10 DIAGNOSIS — R109 Unspecified abdominal pain: Secondary | ICD-10-CM | POA: Insufficient documentation

## 2017-09-10 DIAGNOSIS — N390 Urinary tract infection, site not specified: Secondary | ICD-10-CM

## 2017-09-10 DIAGNOSIS — N132 Hydronephrosis with renal and ureteral calculous obstruction: Secondary | ICD-10-CM | POA: Insufficient documentation

## 2017-09-10 DIAGNOSIS — N1330 Unspecified hydronephrosis: Secondary | ICD-10-CM

## 2017-09-10 DIAGNOSIS — Z87440 Personal history of urinary (tract) infections: Secondary | ICD-10-CM | POA: Insufficient documentation

## 2017-09-10 DIAGNOSIS — N200 Calculus of kidney: Secondary | ICD-10-CM

## 2017-09-10 MED ORDER — TAMSULOSIN HCL 0.4 MG PO CAPS: 0.4000 mg | ORAL_CAPSULE | Freq: Every day | ORAL | 0 refills | Status: DC

## 2017-09-10 NOTE — Telephone Encounter (Signed)
Mychart message was sent to patient to inform her on the next step.

## 2017-09-11 ENCOUNTER — Telehealth: Payer: Self-pay

## 2017-09-11 NOTE — Telephone Encounter (Signed)
Please advise 

## 2017-09-11 NOTE — Telephone Encounter (Signed)
Copied from [place] [phone]. Topic: Inquiry >> Sep 11, 2017  3:59 PM Pricilla Handler wrote: Reason for CRM: Patient stated that [facility] cannot see her before 10/01/2017. Patient is in a lot of pain. Patient wants to know if she can be seen sooner elsewhere, or if Dr. Olivia Mackie could call [facility] to speed up the process. Please call the patient at [phone] [facility]) or [phone] (Cell).        Thank You!!!

## 2017-09-12 NOTE — Telephone Encounter (Signed)
Try to work in sooner please (can also try [facility] in [place]) If still worse and no appts available  -rec go to ED  Thanks [place]

## 2017-09-13 ENCOUNTER — Other Ambulatory Visit: Payer: Self-pay | Admitting: Internal Medicine

## 2017-09-13 ENCOUNTER — Telehealth: Payer: Self-pay

## 2017-09-13 DIAGNOSIS — N200 Calculus of kidney: Secondary | ICD-10-CM

## 2017-09-13 MED ORDER — KETOROLAC TROMETHAMINE 10 MG PO TABS: 10.0000 mg | ORAL_TABLET | Freq: Every day | ORAL | 0 refills | Status: DC | PRN

## 2017-09-13 NOTE — Telephone Encounter (Signed)
Disc with pt if worse go to [facility]  offered trial of toradol orally sent to pharmacy  Faxed CT renal to Dr. Donzetta Matters office appt 09/17/17   Taylor  Brock please fax CT renal to Dr. Rogers Blocker phone # in note pt does not know fax appt 09/17/17

## 2017-09-13 NOTE — Telephone Encounter (Signed)
[facility] is booked out and there has been no cancellations to get her in earlier.

## 2017-09-13 NOTE — Telephone Encounter (Signed)
Copied from [place] [phone]. Topic: Inquiry >> Sep 11, 2017  3:59 PM Pricilla Handler wrote: Reason for CRM: Patient stated that [facility] cannot see her before 10/01/2017. Patient is in a lot of pain. Patient wants to know if she can be seen sooner elsewhere, or if Dr. Olivia Mackie could call [facility] to speed up the process. Please call the patient at [phone] [facility]) or [phone] (Cell).        Thank You!!!  >> Sep 13, 2017 11:00 AM Yvette Rack wrote: Pt states that she had called around to see if she can get seen quicker and she found Dr Rogers Blocker at [facility] [phone]  her appt is 09-17-17 and they would like a fax to them of a written report CT scan   Pt doesn't know the fax number  But the number is [phone]

## 2017-09-16 NOTE — Telephone Encounter (Signed)
CT has been faxed to [phone] [facility] urology.

## 2017-09-17 DIAGNOSIS — N23 Unspecified renal colic: Secondary | ICD-10-CM | POA: Diagnosis not present

## 2017-09-17 DIAGNOSIS — N201 Calculus of ureter: Secondary | ICD-10-CM | POA: Diagnosis not present

## 2017-09-19 ENCOUNTER — Other Ambulatory Visit: Payer: Self-pay

## 2017-09-19 ENCOUNTER — Encounter: Payer: Self-pay | Admitting: *Deleted

## 2017-09-19 ENCOUNTER — Encounter
Admission: RE | Disposition: A | Discharge status: Home / Self Care | Payer: Self-pay | Source: Ambulatory Visit | Attending: Urology

## 2017-09-19 ENCOUNTER — Ambulatory Visit
Admission: RE | Admit: 2017-09-19 | Discharge: 2017-09-19 | Disposition: A | Discharge status: Home / Self Care | Payer: Medicare HMO | Source: Ambulatory Visit | Attending: Urology | Admitting: Urology

## 2017-09-19 DIAGNOSIS — K449 Diaphragmatic hernia without obstruction or gangrene: Secondary | ICD-10-CM | POA: Insufficient documentation

## 2017-09-19 DIAGNOSIS — Z8711 Personal history of peptic ulcer disease: Secondary | ICD-10-CM | POA: Diagnosis not present

## 2017-09-19 DIAGNOSIS — N201 Calculus of ureter: Secondary | ICD-10-CM | POA: Insufficient documentation

## 2017-09-19 DIAGNOSIS — E669 Obesity, unspecified: Secondary | ICD-10-CM | POA: Insufficient documentation

## 2017-09-19 DIAGNOSIS — I10 Essential (primary) hypertension: Secondary | ICD-10-CM | POA: Insufficient documentation

## 2017-09-19 DIAGNOSIS — Z6835 Body mass index (BMI) 35.0-35.9, adult: Secondary | ICD-10-CM | POA: Insufficient documentation

## 2017-09-19 HISTORY — PX: EXTRACORPOREAL SHOCK WAVE LITHOTRIPSY: SHX1557

## 2017-09-19 SURGERY — EXTRACORPOREAL SHOCK WAVE LITHOTRIPSY (ESWL)
Anesthesia: Moderate Sedation | Laterality: Left

## 2017-09-19 MED ORDER — LEVOFLOXACIN 500 MG PO TABS
500.0000 mg | ORAL_TABLET | Freq: Once | ORAL | Status: AC
  Administered 2017-09-19: 500 mg via ORAL

## 2017-09-19 MED ORDER — DOCUSATE SODIUM 100 MG PO CAPS: 200.0000 mg | ORAL_CAPSULE | Freq: Two times a day (BID) | ORAL | 3 refills | Status: DC

## 2017-09-19 MED ORDER — DIPHENHYDRAMINE HCL 25 MG PO CAPS
25.0000 mg | ORAL_CAPSULE | ORAL | Status: AC
  Administered 2017-09-19: 25 mg via ORAL

## 2017-09-19 MED ORDER — MIDAZOLAM HCL 2 MG/2ML IJ SOLN
1.0000 mg | Freq: Once | INTRAMUSCULAR | Status: AC
  Administered 2017-09-19: 1 mg via INTRAMUSCULAR

## 2017-09-19 MED ORDER — FUROSEMIDE 10 MG/ML IJ SOLN
10.0000 mg | Freq: Once | INTRAMUSCULAR | Status: AC
  Administered 2017-09-19: 10 mg via INTRAVENOUS

## 2017-09-19 MED ORDER — FUROSEMIDE 10 MG/ML IJ SOLN
INTRAMUSCULAR | Status: AC
  Administered 2017-09-19: 10 mg via INTRAVENOUS
  Filled 2017-09-19: qty 2

## 2017-09-19 MED ORDER — CIPROFLOXACIN HCL 500 MG PO TABS: 500.0000 mg | ORAL_TABLET | Freq: Two times a day (BID) | ORAL | 0 refills | Status: DC

## 2017-09-19 MED ORDER — TAMSULOSIN HCL 0.4 MG PO CAPS: 0.4000 mg | ORAL_CAPSULE | Freq: Every day | ORAL | 11 refills | Status: DC

## 2017-09-19 MED ORDER — ONDANSETRON 8 MG PO TBDP: 8.0000 mg | ORAL_TABLET | Freq: Four times a day (QID) | ORAL | 3 refills | Status: DC | PRN

## 2017-09-19 MED ORDER — DEXTROSE-NACL 5-0.45 % IV SOLN
INTRAVENOUS | Status: DC
  Administered 2017-09-19: 13:00:00 via INTRAVENOUS

## 2017-09-19 MED ORDER — NUCYNTA 50 MG PO TABS: 50.0000 mg | ORAL_TABLET | Freq: Four times a day (QID) | ORAL | 0 refills | Status: DC | PRN

## 2017-09-19 MED ORDER — MORPHINE SULFATE (PF) 10 MG/ML IV SOLN
10.0000 mg | Freq: Once | INTRAVENOUS | Status: AC
  Administered 2017-09-19: 10 mg via INTRAMUSCULAR

## 2017-09-19 MED ORDER — PROMETHAZINE HCL 25 MG/ML IJ SOLN
25.0000 mg | Freq: Once | INTRAMUSCULAR | Status: AC
  Administered 2017-09-19: 25 mg via INTRAMUSCULAR

## 2017-09-19 NOTE — Discharge Instructions (Signed)
Kidney Stones °Kidney stones (urolithiasis) are rock-like masses that form inside of the kidneys. Kidneys are organs that make pee (urine). A kidney stone can cause very bad pain and can block the flow of pee. The stone usually leaves your body (passes) through your pee. You may need to have a doctor take out the stone. °Follow these instructions at home: °Eating and drinking °· Drink enough fluid to keep your pee clear or pale yellow. This will help you pass the stone. °· If told by your doctor, change the foods you eat (your diet). This may include: °? Limiting how much salt (sodium) you eat. °? Eating more fruits and vegetables. °? Limiting how much meat, poultry, fish, and eggs you eat. °· Follow instructions from your doctor about eating or drinking restrictions. °General instructions °· Collect pee samples as told by your doctor. You may need to collect a pee sample: °? 24 hours after a stone comes out. °? 8-12 weeks after a stone comes out, and every 6-12 months after that. °· Strain your pee every time you pee (urinate), for as long as told. Use the strainer that your doctor recommends. °· Do not throw out the stone. Keep it so that it can be tested by your doctor. °· Take over-the-counter and prescription medicines only as told by your doctor. °· Keep all follow-up visits as told by your doctor. This is important. You may need follow-up tests. °Preventing kidney stones °To prevent another kidney stone: °· Drink enough fluid to keep your pee clear or pale yellow. This is the best way to prevent kidney stones. °· Eat healthy foods. °· Avoid certain foods as told by your doctor. You may be told to eat less protein. °· Stay at a healthy weight. ° °Contact a doctor if: °· You have pain that gets worse or does not get better with medicine. °Get help right away if: °· You have a fever or chills. °· You get very bad pain. °· You get new pain in your belly (abdomen). °· You pass out (faint). °· You cannot pee. °This  information is not intended to replace advice given to you by your health care provider. Make sure you discuss any questions you have with your health care provider. °Document Released: 08/08/2007 Document Revised: 11/08/2015 Document Reviewed: 11/08/2015 °Elsevier Interactive Patient Education © 2017 Else[employer] Inc[employer] ° ° °Lithotripsy °Lithotripsy is a treatment that can sometimes help eliminate kidney stones and the pain that they cause. A form of lithotripsy, also known as extracorporeal shock wave lithotripsy, is a nonsurgical procedure that crushes a kidney stone with shock waves. These shock waves pass through your body and focus on the kidney stone. They cause the kidney stone to break up while it is still in the urinary tract. This makes it easier for the smaller pieces of stone to pass in the urine. °Tell a health care provider about: °· Any allergies you have. °· All medicines you are taking, including vitamins, herbs, eye drops, creams, and over-the-counter medicines. °· Any blood disorders you have. °· Any surgeries you have had. °· Any medical conditions you have. °· Whether you are pregnant or may be pregnant. °· Any problems you or family members have had with anesthetic medicines. °What are the risks? °Generally, this is a safe procedure. However, problems may occur, including: °· Infection. °· Bleeding of the kidney. °· Bruising of the kidney or skin. °· Scarring of the kidney, which can lead to: °? Increased blood pressure. °? Poor   kidney function. °? Return (recurrence) of kidney stones. °· Damage to other structures or organs, such as the liver, colon, spleen, or pancreas. °· Blockage (obstruction) of the the tube that carries urine from the kidney to the bladder (ureter). °· Failure of the kidney stone to break into pieces (fragments). ° °What happens before the procedure? °Staying hydrated °Follow instructions from your health care provider about hydration, which may include: °· Up to 2 hours  before the procedure - you may continue to drink clear liquids, such as water, clear fruit juice, black coffee, and plain tea. ° °Eating and drinking restrictions °Follow instructions from your health care provider about eating and drinking, which may include: °· 8 hours before the procedure - stop eating heavy meals or foods such as meat, fried foods, or fatty foods. °· 6 hours before the procedure - stop eating light meals or foods, such as toast or cereal. °· 6 hours before the procedure - stop drinking milk or drinks that contain milk. °· 2 hours before the procedure - stop drinking clear liquids. ° °General instructions °· Plan to have someone take you home from the hospital or clinic. °· Ask your health care provider about: °? Changing or stopping your regular medicines. This is especially important if you are taking diabetes medicines or blood thinners. °? Taking medicines such as aspirin and ibuprofen. These medicines and other NSAIDs can thin your blood. Do not take these medicines for 7 days before your procedure if your health care provider instructs you not to. °· You may have tests, such as: °? Blood tests. °? Urine tests. °? Imaging tests, such as a CT scan. °What happens during the procedure? °· To lower your risk of infection: °? Your health care team will wash or sanitize their hands. °? Your skin will be washed with soap. °· An IV tube will be inserted into one of your veins. This tube will give you fluids and medicines. °· You will be given one or more of the following: °? A medicine to help you relax (sedative). °? A medicine to make you fall asleep (general anesthetic). °· A water-filled cushion may be placed behind your kidney or on your abdomen. In some cases you may be placed in a tub of lukewarm water. °· Your body will be positioned in a way that makes it easy to target the kidney stone. °· A flexible tube with holes in it (stent) may be placed in the ureter. This will help keep urine  flowing from the kidney if the fragments of the stone have been blocking the ureter. °· An X-ray or ultrasound exam will be done to locate your stone. °· Shock waves will be aimed at the stone. If you are awake, you may feel a tapping sensation as the shock waves pass through your body. °The procedure may vary among health care providers and hospitals. °What happens after the procedure? °· You may have an X-ray to see whether the procedure was able to break up the kidney stone and how much of the stone has passed. If large stone fragments remain after treatment, you may need to have a second procedure at a later time. °· Your blood pressure, heart rate, breathing rate, and blood oxygen level will be monitored until the medicines you were given have worn off. °· You may be given antibiotics or pain medicine as needed. °· If a stent was placed in your ureter during surgery, it may stay in place for a few   weeks. °· You may need strain your urine to collect pieces of the kidney stone for testing. °· You will need to drink plenty of water. °· Do not drive for 24 hours if you were given a sedative. °Summary °· Lithotripsy is a treatment that can sometimes help eliminate kidney stones and the pain that they cause. °· A form of lithotripsy, also known as extracorporeal shock wave lithotripsy, is a nonsurgical procedure that crushes a kidney stone with shock waves. °· Generally, this is a safe procedure. However, problems may occur, including damage to the kidney or other organs, infection, or obstruction of the tube that carries urine from the kidney to the bladder (ureter). °· When you go home, you will need to drink plenty of water. You may be asked to strain your urine to collect pieces of the kidney stone for testing. °This information is not intended to replace advice given to you by your health care provider. Make sure you discuss any questions you have with your health care provider. °Document Released: 02/17/2000  Document Revised: 01/11/2016 Document Reviewed: 01/11/2016 °Elsevier Interactive Patient Education © 2018 Else[employer] Inc[employer] ° ° °Lithotripsy, Care After °This sheet gives you information about how to care for yourself after your procedure. Your health care provider may also give you more specific instructions. If you have problems or questions, contact your health care provider. °What can I expect after the procedure? °After the procedure, it is common to have: °· Some blood in your urine. This should only last for a few days. °· Soreness in your back, sides, or upper abdomen for a few days. °· Blotches or bruises on your back where the pressure wave entered the skin. °· Pain, discomfort, or nausea when pieces (fragments) of the kidney stone move through the tube that carries urine from the kidney to the bladder (ureter). Stone fragments may pass soon after the procedure, but they may continue to pass for up to 4-8 weeks. °? If you have severe pain or nausea, contact your health care provider. This may be caused by a large stone that was not broken up, and this may mean that you need more treatment. °· Some pain or discomfort during urination. °· Some pain or discomfort in the lower abdomen or (in men) at the base of the penis. ° °Follow these instructions at home: °Medicines °· Take over-the-counter and prescription medicines only as told by your health care provider. °· If you were prescribed an antibiotic medicine, take it as told by your health care provider. Do not stop taking the antibiotic even if you start to feel better. °· Do not drive for 24 hours if you were given a medicine to help you relax (sedative). °· Do not drive or use heavy machinery while taking prescription pain medicine. °Eating and drinking °· Drink enough water and fluids to keep your urine clear or pale yellow. This helps any remaining pieces of the stone to pass. It can also help prevent new stones from forming. °· Eat plenty of fresh fruits  and vegetables. °· Follow instructions from your health care provider about eating and drinking restrictions. You may be instructed: °? To reduce how much salt (sodium) you eat or drink. Check ingredients and nutrition facts on packaged foods and beverages. °? To reduce how much meat you eat. °· Eat the recommended amount of calcium for your age and gender. Ask your health care provider how much calcium you should have. °General instructions °· Get plenty of rest. °·   Most people can resume normal activities 1-2 days after the procedure. Ask your health care provider what activities are safe for you. °· If directed, strain all urine through the strainer that was provided by your health care provider. °? Keep all fragments for your health care provider to see. Any stones that are found may be sent to a medical lab for examination. The stone may be as small as a grain of salt. °· Keep all follow-up visits as told by your health care provider. This is important. °Contact a health care provider if: °· You have pain that is severe or does not get better with medicine. °· You have nausea that is severe or does not go away. °· You have blood in your urine longer than your health care provider told you to expect. °· You have more blood in your urine. °· You have pain during urination that does not go away. °· You urinate more frequently than usual and this does not go away. °· You develop a rash or any other possible signs of an allergic reaction. °Get help right away if: °· You have severe pain in your back, sides, or upper abdomen. °· You have severe pain while urinating. °· Your urine is very dark red. °· You have blood in your stool (feces). °· You cannot pass any urine at all. °· You feel a strong urge to urinate after emptying your bladder. °· You have a fever or chills. °· You develop shortness of breath, difficulty breathing, or chest pain. °· You have severe nausea that leads to persistent vomiting. °· You  faint. °Summary °· After this procedure, it is common to have some pain, discomfort, or nausea when pieces (fragments) of the kidney stone move through the tube that carries urine from the kidney to the bladder (ureter). If this pain or nausea is severe, however, you should contact your health care provider. °· Most people can resume normal activities 1-2 days after the procedure. Ask your health care provider what activities are safe for you. °· Drink enough water and fluids to keep your urine clear or pale yellow. This helps any remaining pieces of the stone to pass, and it can help prevent new stones from forming. °· If directed, strain your urine and keep all fragments for your health care provider to see. Fragments or stones may be as small as a grain of salt. °· Get help right away if you have severe pain in your back, sides, or upper abdomen or have severe pain while urinating. °This information is not intended to replace advice given to you by your health care provider. Make sure you discuss any questions you have with your health care provider. °Document Released: 03/11/2007 Document Revised: 01/11/2016 Document Reviewed: 01/11/2016 °Elsevier Interactive Patient Education © 2018 Elsevier Inc. ° °

## 2017-09-19 NOTE — Progress Notes (Signed)
Voided 300cc pink tinged urine   Strained   No stones

## 2017-09-20 ENCOUNTER — Encounter: Payer: Self-pay | Admitting: Urology

## 2017-09-20 ENCOUNTER — Other Ambulatory Visit: Payer: Self-pay | Admitting: Internal Medicine

## 2017-09-20 DIAGNOSIS — I10 Essential (primary) hypertension: Secondary | ICD-10-CM

## 2017-09-20 MED ORDER — TRIAMTERENE-HCTZ 37.5-25 MG PO TABS: 1.0000 | ORAL_TABLET | Freq: Every day | ORAL | 3 refills | Status: DC

## 2017-09-24 ENCOUNTER — Other Ambulatory Visit: Payer: Self-pay | Admitting: Internal Medicine

## 2017-09-24 DIAGNOSIS — M5440 Lumbago with sciatica, unspecified side: Secondary | ICD-10-CM

## 2017-10-01 ENCOUNTER — Ambulatory Visit: Payer: Self-pay | Admitting: Urology

## 2017-10-03 DIAGNOSIS — N201 Calculus of ureter: Secondary | ICD-10-CM | POA: Diagnosis not present

## 2017-10-15 DIAGNOSIS — R69 Illness, unspecified: Secondary | ICD-10-CM | POA: Diagnosis not present

## 2017-10-22 ENCOUNTER — Ambulatory Visit: Payer: Medicare HMO

## 2017-11-01 ENCOUNTER — Ambulatory Visit
Admission: RE | Admit: 2017-11-01 | Discharge: 2017-11-01 | Disposition: A | Discharge status: Home / Self Care | Payer: Medicare HMO | Source: Ambulatory Visit | Attending: Internal Medicine | Admitting: Internal Medicine

## 2017-11-01 ENCOUNTER — Other Ambulatory Visit: Payer: Self-pay | Admitting: Internal Medicine

## 2017-11-01 DIAGNOSIS — N6310 Unspecified lump in the right breast, unspecified quadrant: Secondary | ICD-10-CM

## 2017-11-01 DIAGNOSIS — Z1231 Encounter for screening mammogram for malignant neoplasm of breast: Secondary | ICD-10-CM | POA: Diagnosis present

## 2017-11-05 ENCOUNTER — Encounter: Payer: Self-pay | Admitting: *Deleted

## 2017-11-08 DIAGNOSIS — M5136 Other intervertebral disc degeneration, lumbar region: Secondary | ICD-10-CM | POA: Diagnosis not present

## 2017-11-08 DIAGNOSIS — M461 Sacroiliitis, not elsewhere classified: Secondary | ICD-10-CM | POA: Diagnosis not present

## 2017-11-08 DIAGNOSIS — M1712 Unilateral primary osteoarthritis, left knee: Secondary | ICD-10-CM | POA: Diagnosis not present

## 2017-11-13 ENCOUNTER — Ambulatory Visit
Admission: RE | Admit: 2017-11-13 | Discharge: 2017-11-13 | Disposition: A | Discharge status: Home / Self Care | Payer: Medicare HMO | Source: Ambulatory Visit | Attending: Internal Medicine | Admitting: Internal Medicine

## 2017-11-13 DIAGNOSIS — R922 Inconclusive mammogram: Secondary | ICD-10-CM | POA: Diagnosis not present

## 2017-11-13 DIAGNOSIS — N6001 Solitary cyst of right breast: Secondary | ICD-10-CM | POA: Diagnosis not present

## 2017-11-13 DIAGNOSIS — N6310 Unspecified lump in the right breast, unspecified quadrant: Secondary | ICD-10-CM

## 2017-12-02 DIAGNOSIS — R69 Illness, unspecified: Secondary | ICD-10-CM | POA: Diagnosis not present

## 2017-12-13 ENCOUNTER — Other Ambulatory Visit: Payer: Self-pay | Admitting: Internal Medicine

## 2017-12-13 DIAGNOSIS — M5440 Lumbago with sciatica, unspecified side: Secondary | ICD-10-CM

## 2017-12-13 MED ORDER — METHOCARBAMOL 500 MG PO TABS: 500.0000 mg | ORAL_TABLET | Freq: Every evening | ORAL | 5 refills | Status: DC | PRN

## 2017-12-17 DIAGNOSIS — R69 Illness, unspecified: Secondary | ICD-10-CM | POA: Diagnosis not present

## 2017-12-18 DIAGNOSIS — D2261 Melanocytic nevi of right upper limb, including shoulder: Secondary | ICD-10-CM | POA: Diagnosis not present

## 2017-12-18 DIAGNOSIS — L821 Other seborrheic keratosis: Secondary | ICD-10-CM | POA: Diagnosis not present

## 2017-12-18 DIAGNOSIS — D2262 Melanocytic nevi of left upper limb, including shoulder: Secondary | ICD-10-CM | POA: Diagnosis not present

## 2017-12-18 DIAGNOSIS — L570 Actinic keratosis: Secondary | ICD-10-CM | POA: Diagnosis not present

## 2017-12-18 DIAGNOSIS — X32XXXA Exposure to sunlight, initial encounter: Secondary | ICD-10-CM | POA: Diagnosis not present

## 2017-12-18 DIAGNOSIS — D2272 Melanocytic nevi of left lower limb, including hip: Secondary | ICD-10-CM | POA: Diagnosis not present

## 2017-12-18 DIAGNOSIS — D2271 Melanocytic nevi of right lower limb, including hip: Secondary | ICD-10-CM | POA: Diagnosis not present

## 2017-12-31 ENCOUNTER — Encounter: Payer: Self-pay | Admitting: Internal Medicine

## 2017-12-31 ENCOUNTER — Ambulatory Visit (INDEPENDENT_AMBULATORY_CARE_PROVIDER_SITE_OTHER): Payer: Medicare HMO

## 2017-12-31 ENCOUNTER — Ambulatory Visit (INDEPENDENT_AMBULATORY_CARE_PROVIDER_SITE_OTHER): Payer: Medicare HMO | Admitting: Internal Medicine

## 2017-12-31 VITALS — BP 138/70 | HR 76 | Temp 98.2°F | Ht 65.0 in | Wt 220.4 lb

## 2017-12-31 VITALS — BP 122/72 | HR 79 | Temp 98.3°F | Resp 16 | Ht 65.0 in | Wt 219.8 lb

## 2017-12-31 DIAGNOSIS — M25551 Pain in right hip: Secondary | ICD-10-CM | POA: Diagnosis not present

## 2017-12-31 DIAGNOSIS — M5126 Other intervertebral disc displacement, lumbar region: Secondary | ICD-10-CM | POA: Diagnosis not present

## 2017-12-31 DIAGNOSIS — E559 Vitamin D deficiency, unspecified: Secondary | ICD-10-CM

## 2017-12-31 DIAGNOSIS — R739 Hyperglycemia, unspecified: Secondary | ICD-10-CM | POA: Diagnosis not present

## 2017-12-31 DIAGNOSIS — M25562 Pain in left knee: Secondary | ICD-10-CM

## 2017-12-31 DIAGNOSIS — M5136 Other intervertebral disc degeneration, lumbar region: Secondary | ICD-10-CM

## 2017-12-31 DIAGNOSIS — G8929 Other chronic pain: Secondary | ICD-10-CM

## 2017-12-31 DIAGNOSIS — Z0184 Encounter for antibody response examination: Secondary | ICD-10-CM

## 2017-12-31 DIAGNOSIS — I10 Essential (primary) hypertension: Secondary | ICD-10-CM | POA: Diagnosis not present

## 2017-12-31 DIAGNOSIS — M5416 Radiculopathy, lumbar region: Secondary | ICD-10-CM

## 2017-12-31 DIAGNOSIS — K760 Fatty (change of) liver, not elsewhere classified: Secondary | ICD-10-CM | POA: Diagnosis not present

## 2017-12-31 DIAGNOSIS — M25552 Pain in left hip: Secondary | ICD-10-CM

## 2017-12-31 DIAGNOSIS — M109 Gout, unspecified: Secondary | ICD-10-CM | POA: Diagnosis not present

## 2017-12-31 DIAGNOSIS — M5440 Lumbago with sciatica, unspecified side: Secondary | ICD-10-CM | POA: Diagnosis not present

## 2017-12-31 DIAGNOSIS — Z0000 Encounter for general adult medical examination without abnormal findings: Secondary | ICD-10-CM

## 2017-12-31 DIAGNOSIS — Z1159 Encounter for screening for other viral diseases: Secondary | ICD-10-CM

## 2017-12-31 DIAGNOSIS — Z87442 Personal history of urinary calculi: Secondary | ICD-10-CM | POA: Insufficient documentation

## 2017-12-31 DIAGNOSIS — Z1329 Encounter for screening for other suspected endocrine disorder: Secondary | ICD-10-CM

## 2017-12-31 DIAGNOSIS — M51369 Other intervertebral disc degeneration, lumbar region without mention of lumbar back pain or lower extremity pain: Secondary | ICD-10-CM

## 2017-12-31 MED ORDER — METHYLPREDNISOLONE ACETATE 40 MG/ML IJ SUSP
40.0000 mg | Freq: Once | INTRAMUSCULAR | Status: AC
  Administered 2017-12-31: 40 mg via INTRAMUSCULAR

## 2017-12-31 MED ORDER — METHOCARBAMOL 750 MG PO TABS: 750.0000 mg | ORAL_TABLET | Freq: Every evening | ORAL | 0 refills | Status: DC | PRN

## 2017-12-31 MED ORDER — TRAMADOL HCL 50 MG PO TABS: 50.0000 mg | ORAL_TABLET | Freq: Two times a day (BID) | ORAL | 2 refills | Status: DC

## 2017-12-31 NOTE — Progress Notes (Signed)
Chief Complaint  Patient presents with  . Follow-up   Acute visit  1. C/o worsening low back pain worse on left but goes left and right x months pain is 8/10 she is off balance and has difficulty bending at the knee esp on the left. She has tried otc salonpas pain patches and robaxin 500 mg w/o relief and gabapentin 100 mg qhs as well as tramadol 50 mg bid w/o much help. She denies numbness tingling b/l legs, saddle anesthesia bowel or bladder incontinence.   Reviewed MRI low back 02/06/09   1. At the L5-S1 level there is an annular disc bulge asymmetric toward the  right. The AP dimension of the thecal sac remains 12 mm in the midline but  there is significant impact upon the exiting right L5 nerve root as well  as  the S1 nerve root at this level.  2. At the L4-L5 level there is a left paracentral HNP producing mild mass  effect upon the L5 nerve root as it courses to the level below on the  left.  The AP dimension the thecal sac is reduced to approximately to 9 mm just  to  the left of midline due to the H&P.  3. There milder changes demonstrated elsewhere.   2. HTN controlled on maxzide 37.5-25 mg qd  3. Kidney stones saw Dr. Rogers Blocker and she had procedure to break up kidney stones and they passed like sand in 2019 they were not tested per pt  4. 2.5 cm right breast cyst noted on mammogram 11/13/17  5. C/o b/l hip pain  6. H/o gout controlled on allopurinol 300 mg qd   Review of Systems  Constitutional: Negative for weight loss.  HENT: Negative for hearing loss.   Eyes: Negative for blurred vision.  Respiratory: Negative for shortness of breath.   Cardiovascular: Negative for chest pain.  Gastrointestinal: Negative for abdominal pain.  Musculoskeletal: Positive for back pain and joint pain. Negative for falls.  Skin: Negative for rash.  Neurological: Negative for headaches.  Psychiatric/Behavioral: Negative for depression.   Past Medical History:  Diagnosis Date  . Arthritis    . Basal cell carcinoma    right temple Dr. Elmer Ramp removed 01/2017   . Chicken pox   . Emphysema of lung ([place])    h/o bronchitis not emphysema  . Fatty liver    09/19/15  . Gastric ulcer    neg H pylori   . GERD (gastroesophageal reflux disease)   . Gout   . Hiatal hernia   . Hyperlipidemia   . Hypertension   . Kidney stone    noted CT ab/pelvis 09/19/15 left kidney   . Recurrent UTI   . Syncope    per pt presyncope no LOC felt dizzy/vision distorted and couldnt speak EEG neg, CT head neg 07/31/16, f/u [facility]ology with US carotid, echo but no Holter   . Umbilical hernia    noted 09/19/15   . UTI (urinary tract infection)    Past Surgical History:  Procedure Laterality Date  . BREAST BIOPSY Right 1980   cyst removed negative pathology   . EXTRACORPOREAL SHOCK WAVE LITHOTRIPSY Left 09/19/2017   Procedure: EXTRACORPOREAL SHOCK WAVE LITHOTRIPSY (ESWL);  Surgeon: Royston Cowper, MD;  Location: [facility] ORS;  Service: Urology;  Laterality: Left;  . JOINT REPLACEMENT     right knee 11/2015 Dr. Janell Quiet [facility] [place]   Family History  Problem Relation Age of Onset  . Breast cancer Mother  12  . Cancer Mother        breast cancer s/p masectomy lived to be 85   . Hypertension Mother   . Cancer Father        NMSC skin cancer   . Gout Father   . Heart disease Father        s/p bypass surgery age 71 lived 5 months after surgery   . Kidney disease Father        RAS>kidney failure   . Hyperlipidemia Other   . Stroke Other   . Kidney cancer Neg Hx   . Bladder Cancer Neg Hx   . Prolactinoma Neg Hx    Social History   Socioeconomic History  . Marital status: Widowed    Spouse name: Not on file  . Number of children: Not on file  . Years of education: Not on file  . Highest education level: Not on file  Occupational History  . Not on file  Social Needs  . Financial resource strain: Not hard at all  . Food insecurity:    Worry: Never true    Inability: Never true  .  Transportation needs:    Medical: No    Non-medical: No  Tobacco Use  . Smoking status: Former Research scientist (life sciences)  . Smokeless tobacco: Never Used  . Tobacco comment: smoked total 30 years quit 20 years ago from 2019 max 1 ppd no FH lung cancer   Substance and Sexual Activity  . Alcohol use: No  . Drug use: Never  . Sexual activity: Not on file  Lifestyle  . Physical activity:    Days per week: 0 days    Minutes per session: Not on file  . Stress: Not at all  Relationships  . Social connections:    Talks on phone: Not on file    Gets together: Not on file    Attends religious service: Not on file    Active member of club or organization: Not on file    Attends meetings of clubs or organizations: Not on file    Relationship status: Not on file  . Intimate partner violence:    Fear of current or ex partner: Not on file    Emotionally abused: Not on file    Physically abused: Not on file    Forced sexual activity: Not on file  Other Topics Concern  . Not on file  Social History Narrative   2 years college    Retired Customer service manager    Current Meds  Medication Sig  . acetaminophen (TYLENOL) 500 MG tablet Take 1,000 mg by mouth 2 (two) times daily.   Marland Kitchen allopurinol (ZYLOPRIM) 300 MG tablet Take 1 tablet (300 mg total) by mouth daily.  Marland Kitchen atorvastatin (LIPITOR) 20 MG tablet Take 1 tablet (20 mg total) by mouth daily at 6 PM.  . calcium carbonate 1250 MG capsule Take 1,250 mg by mouth 2 (two) times daily with a meal.  . Cholecalciferol (VITAMIN D3) 5000 units CAPS Take by mouth daily.  Marland Kitchen CRANBERRY PO Take 4,200 mg by mouth daily.  . Cyanocobalamin (B-12) 5000 MCG CAPS Take by mouth daily.  Marland Kitchen docusate sodium (COLACE) 100 MG capsule Take 2 capsules (200 mg total) by mouth 2 (two) times daily.  Marland Kitchen estradiol (ESTRACE VAGINAL) 0.1 MG/GM vaginal cream Apply 0.31m (pea-sized amount)  just inside the vaginal introitus with a finger-tip on Monday, Wednesday and Friday nights.  . gabapentin (NEURONTIN) 100 MG  capsule TAKE 1 CAPSULE (100 MG  TOTAL) BY MOUTH AT BEDTIME.  . Magnesium 500 MG CAPS Take 1 tablet by mouth at bedtime.  . pantoprazole (PROTONIX) 40 MG tablet Take 1 tablet (40 mg total) by mouth daily.  . potassium chloride (MICRO-K) 10 MEQ CR capsule Take 1 capsule (10 mEq total) by mouth 2 (two) times daily.  . tamsulosin (FLOMAX) 0.4 MG CAPS capsule Take 1 capsule (0.4 mg total) by mouth daily.  . traMADol (ULTRAM) 50 MG tablet Take 50 mg by mouth 2 (two) times daily.  Marland Kitchen triamterene-hydrochlorothiazide (MAXZIDE-25) 37.5-25 MG tablet Take 1 tablet by mouth daily.  . [DISCONTINUED] NUCYNTA 50 MG tablet Take 1 tablet (50 mg total) by mouth every 6 (six) hours as needed for moderate pain. 1 TO 2 TABS Q 6 HOURS PRN PAIN   Allergies  Allergen Reactions  . Nortriptyline Nausea Only  . Sulfa Antibiotics Rash   No results found for this or any previous visit (from the past 2160 hour(s)). Objective  Body mass index is 36.68 kg/m. Wt Readings from Last 3 Encounters:  12/31/17 220 lb 6.4 oz (100 kg)  12/31/17 219 lb 12.8 oz (99.7 kg)  09/19/17 212 lb (96.2 kg)   Temp Readings from Last 3 Encounters:  12/31/17 98.2 F (36.8 C) (Oral)  12/31/17 98.3 F (36.8 C) (Oral)  09/19/17 98.4 F (36.9 C) (Temporal)   BP Readings from Last 3 Encounters:  12/31/17 138/70  12/31/17 122/72  09/19/17 (!) 147/68   Pulse Readings from Last 3 Encounters:  12/31/17 76  12/31/17 79  09/19/17 68    Physical Exam  Constitutional: She is oriented to person, place, and time. Vital signs are normal. She appears well-developed and well-nourished. She is cooperative.  HENT:  Head: Normocephalic and atraumatic.  Mouth/Throat: Oropharynx is clear and moist and mucous membranes are normal.  Eyes: Pupils are equal, round, and reactive to light. Conjunctivae are normal.  Cardiovascular: Normal rate, regular rhythm and normal heart sounds.  Pulmonary/Chest: Effort normal and breath sounds normal.   Musculoskeletal:       Lumbar back: She exhibits tenderness.  Neg st8 leg test b/l  Right and left lower back pain   Neurological: She is alert and oriented to person, place, and time. Gait normal.  Skin: Skin is warm, dry and intact.  Psychiatric: She has a normal mood and affect. Her speech is normal and behavior is normal. Judgment and thought content normal. Cognition and memory are normal.  Nursing note and vitals reviewed.   Assessment   1. 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 B/l hip pain and left knee pain  3. HTN controlled 4. h/o Kidney stones saw Dr. Rogers Blocker 09/19/2017 s/p shock lithotripsy   5. H/o gout controlled on allopurinol 300 mg qd  6. HM Plan   1 and 2. MRI lumbar and b/l Xray hips and pelvis  Disc Dr. Sharlet Salina, Dr. Cari Caraway vs Dr. Deetta Perla for hip (Dr. Roland Rack), knee Dr. Marry Guan she is seeing ortho [facility] currently  depomedrol 40 x 1  Tramadol 50 mg bid pain contract  Robaxin increase to 750 mg qhs  Consider increase gabapentin to 300 mg bid in future pending MRI lumbar   3. Cont meds  sch fasting labs  4. Monitor  5. Check uric acid  Cont meds  6.  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 Was due 10/17/17, also had hep A 03/19/17 2nd dose due in 6-12 months h/o fatty liver    Colonoscopy overdue disc reconsider another today pt wants to wait   ? Date of last pap per pt no h/o abnormal pt per chart h/o abnormal pap Dr. Vickki Muff has record. Last I can see 11/21/01 neg pap +BV  -consider 1 more if it has really been this long.   Mammogram 11/13/17 2.5 cm cyst right breast f/u in 1 year will need to reorder in future    DEXA 04/03/12 osteopenia rec calcium 600 mg bid and on vit D3 5000 qd. Vit D 37  10/02/16  -consider repeat DEXA in future   Former smoker total 30 years max 1 ppd no FH lung cancer quit 20 years ago from 2019  Hep C neg 04/09/16   Consider check MMR in future  H/o skin cancer Dr. Kellie Moor due to see again 06/2018   Provider: Dr. Olivia Mackie McLean-Scocuzza-[facility]

## 2017-12-31 NOTE — Patient Instructions (Signed)
Dr. Sharlet Salina PM&R or neurosurgery Dr. Deetta Perla or [facility]      Herniated Disk A herniated disk, also called a ruptured disk or slipped disk, occurs when a disk in the spine bulges out too far. Between the bones in the spine (vertebrae), there are oval disks that are made of a soft, spongy center that is surrounded by a tough outer ring. The disks connect your vertebrae, help your spine move, and absorb shocks from your movement. When you have a herniated disk, the spongy center of the disk bulges out or breaks through the outer ring. It can press on a nerve between the vertebrae and cause pain. This can occur anywhere in the back or neck area, but the lower back is most commonly affected. What are the causes? This condition may be caused by:  Age-related wear and tear. The spongy centers of spinal disks tend to shrink and dry out with age, which makes them more likely to herniate.  Sudden injury, such as a strain or sprain.  What increases the risk? Aging is the main risk factor for a herniated disk. Other risk factors include:  Being a man who is 58-39 years old.  Frequently doing activities that involve heavy lifting, bending, or twisting.  Frequently driving for long hours at a time.  Not getting enough exercise.  Being overweight.  Smoking.  Having a family history of back problems or herniated disks.  Being pregnant or giving birth.  Having poor nutrition.  Being tall.  What are the signs or symptoms? Symptoms may vary depending on where your herniated disk is located.  A herniated disk in the lower back may cause sharp pain in: ? Part of the arm, leg, hip, or buttocks. ? The back of the lower leg (calf). ? The lower back, spreading down through the leg into the foot (sciatica).  A herniated disk in the neck may cause dizziness and vertigo. It may also cause pain or weakness in: ? The neck. ? The shoulder blades. ? Upper arm, forearm, or fingers.  You may  also have muscle weakness. It may be difficult to: ? Lift your leg or arm. ? Stand on your toes. ? Squeeze tightly with one of your hands.  Other symptoms may include: ? Numbness or tingling in the affected areas of the hands, arms, feet, or legs. ? Inability to control when you urinate or when you have bowel movements. This is a rare but serious sign of a severe herniated disk in the lower back.  How is this diagnosed? This condition may be diagnosed based on:  Your symptoms.  Your medical history.  A physical exam. The exam may include: ? Straight-leg test. You will lie on your back while your health care provider lifts your leg, keeping your knee straight. If you feel pain, you likely have a herniated disk. ? Neurological tests. This includes checking for numbness, reflexes, muscle strength, and posture.  Imaging tests, such as: ? X-rays. ? MRI. ? CT scan. ? Electromyogram (EMG) to check the nerves that control muscles. This test may be used to determine which nerves are affected by your herniated disk.  How is this treated? Treatment for this condition may include:  A short period of rest. This is usually the first treatment. ? You may be on bed rest for up to 2 days, or you may be instructed to stay home and avoid physical activity. ? If you have a herniated disk in your lower back, avoid  sitting as much as possible. Sitting increases pressure on the disk.  Medicines. These may include: ? NSAIDs to help reduce pain and swelling. ? Muscle relaxants to prevent sudden tightening of the back muscles (back spasms). ? Prescription pain medicines, if you have severe pain.  Steroid injections in the area of the herniated disk. This can help reduce pain and swelling.  Physical therapy to strengthen your back muscles.  In many cases, symptoms go away with treatment over a period of days or weeks. You will most likely be free of symptoms after 3-4 months. If other treatments do  not help to relieve your symptoms, you may need surgery. Follow these instructions at home: Medicines  Take over-the-counter and prescription medicines only as told by your health care provider.  Do not drive or use heavy machinery while taking prescription pain medicine. Activity  Rest as directed.  After your rest period: ? Return to your normal activities and gradually begin exercising as told by your health care provider. Ask your health care provider what activities and exercises are safe for you. ? Use good posture. ? Avoid movements that cause pain. ? Do not lift anything that is heavier than 10 lb (4.5 kg) until your health care provider says this is safe. ? Do not sit or stand for long periods of time without changing positions. ? Do not sit for long periods of time without getting up and moving around.  If physical therapy was prescribed, do exercises as instructed.  Aim to strengthen muscles in your back and abdomen with exercises like crunches, swimming, or walking. General instructions  Do not use any products that contain nicotine or tobacco, such as cigarettes and e-cigarettes. These products can delay healing. If you need help quitting, ask your health care provider.  Do not wear high-heeled shoes.  Do not sleep on your belly.  If you are overweight, work with your health care provider to lose weight safely.  To prevent or treat constipation while you are taking prescription pain medicine, your health care provider may recommend that you: ? Drink enough fluid to keep your urine clear or pale yellow. ? Take over-the-counter or prescription medicines. ? Eat foods that are high in fiber, such as fresh fruits and vegetables, whole grains, and beans. ? Limit foods that are high in fat and processed sugars, such as fried and sweet foods.  Keep all follow-up visits as told by your health care provider. This is important. How is this prevented?  Maintain a healthy  weight.  Try to avoid stressful situations.  Maintain physical fitness. Do at least 150 minutes of moderate-intensity exercise each week, such as brisk walking or water aerobics.  When lifting objects: ? Keep your feet at least shoulder-width apart and tighten your abdominal muscles. ? Keep your spine neutral as you bend your knees and hips. It is important to lift using the strength of your legs, not your back. Do not lock your knees straight out. ? Always ask for help to lift heavy or awkward objects. Contact a health care provider if:  You have back pain or neck pain that does not get better after 6 weeks.  You have severe pain in your back, neck, legs, or arms.  You develop numbness, tingling, or weakness in any part of your body. Get help right away if:  You cannot move your arms or legs.  You cannot control when you urinate or have bowel movements.  You feel dizzy or you faint.  You have shortness of breath. This information is not intended to replace advice given to you by your health care provider. Make sure you discuss any questions you have with your health care provider. Document Released: 02/17/2000 Document Revised: 10/17/2015 Document Reviewed: 10/17/2015 Elsevier Interactive Patient Education  2017 [employer].

## 2017-12-31 NOTE — Progress Notes (Signed)
Pre visit review using our clinic review tool, if applicable. No additional management support is needed unless otherwise documented below in the visit note. 

## 2017-12-31 NOTE — Progress Notes (Signed)
Agree   TMS 

## 2017-12-31 NOTE — Patient Instructions (Addendum)
  Ms. Bainter , Thank you for taking time to come for your Medicare Wellness Visit. I appreciate your ongoing commitment to your health goals. Please review the following plan we discussed and let me know if I can assist you in the future.   Follow up today at 3:30.     Bring a copy of your [place] and/or Living Will to be scanned into chart upon completion.  Have a great day!  These are the goals we discussed: Goals      Patient Stated   . Increase physical activity (pt-stated)      Other   . DIET - INCREASE LEAN PROTEINS       This is a list of the screening recommended for you and due dates:  Health Maintenance  Topic Date Due  . Colon Cancer Screening  06/13/2014  . Mammogram  11/02/2019  . Tetanus Vaccine  05/12/2023  . Flu Shot  Completed  . DEXA scan (bone density measurement)  Completed  .  Hepatitis C: One time screening is recommended by Center for Disease Control  (CDC) for  adults born from [birth year] through 1963.   Completed  . Pneumonia vaccines  Completed

## 2017-12-31 NOTE — Progress Notes (Signed)
Subjective:   Cynthia Bryan is a 72 y.o. female who presents for an Initial Medicare Annual Wellness Visit.  Review of Systems    No ROS.  Medicare Wellness Visit. Additional risk factors are reflected in the social history.  Cardiac Risk Factors include: advanced age (>74men, >56 women);hypertension;obesity (BMI >30kg/m2)     Objective:    Today's Vitals   12/31/17 1048  BP: 122/72  Pulse: 79  Resp: 16  Temp: 98.3 F (36.8 C)  TempSrc: Oral  SpO2: 97%  Weight: 219 lb 12.8 oz (99.7 kg)  Height: 5\' 5"  (1.651 m)  PainSc: 8    Body mass index is 36.58 kg/m.  Advanced Directives 12/31/2017 09/19/2017 07/31/2016  Does Patient Have a Medical Advance Directive? Yes Yes No  Type of Paramedic of [place];Living will [place];Living will -  Does patient want to make changes to medical advance directive? No - Patient declined No - Patient declined -  Copy of [place] in Chart? No - copy requested No - copy requested -  Would patient like information on creating a medical advance directive? - - No - Patient declined    Current Medications (verified) Outpatient Encounter Medications as of 12/31/2017  Medication Sig  . acetaminophen (TYLENOL) 500 MG tablet Take 1,000 mg by mouth 2 (two) times daily.   Marland Kitchen allopurinol (ZYLOPRIM) 300 MG tablet Take 1 tablet (300 mg total) by mouth daily.  Marland Kitchen atorvastatin (LIPITOR) 20 MG tablet Take 1 tablet (20 mg total) by mouth daily at 6 PM.  . calcium carbonate 1250 MG capsule Take 1,250 mg by mouth 2 (two) times daily with a meal.  . Cholecalciferol (VITAMIN D3) 5000 units CAPS Take by mouth daily.  Marland Kitchen CRANBERRY PO Take 4,200 mg by mouth daily.  . Cyanocobalamin (B-12) 5000 MCG CAPS Take by mouth daily.  Marland Kitchen docusate sodium (COLACE) 100 MG capsule Take 2 capsules (200 mg total) by mouth 2 (two) times daily.  Marland Kitchen estradiol (ESTRACE VAGINAL) 0.1 MG/GM vaginal cream Apply 0.5mg  (pea-sized  amount)  just inside the vaginal introitus with a finger-tip on Monday, Wednesday and Friday nights.  . gabapentin (NEURONTIN) 100 MG capsule TAKE 1 CAPSULE (100 MG TOTAL) BY MOUTH AT BEDTIME.  . Magnesium 500 MG CAPS Take 1 tablet by mouth at bedtime.  . pantoprazole (PROTONIX) 40 MG tablet Take 1 tablet (40 mg total) by mouth daily.  . potassium chloride (MICRO-K) 10 MEQ CR capsule Take 1 capsule (10 mEq total) by mouth 2 (two) times daily.  . tamsulosin (FLOMAX) 0.4 MG CAPS capsule Take 1 capsule (0.4 mg total) by mouth daily.  . traMADol (ULTRAM) 50 MG tablet Take 50 mg by mouth 2 (two) times daily.  Marland Kitchen triamterene-hydrochlorothiazide (MAXZIDE-25) 37.5-25 MG tablet Take 1 tablet by mouth daily.  . [DISCONTINUED] ciprofloxacin (CIPRO) 500 MG tablet Take 1 tablet (500 mg total) by mouth 2 (two) times daily.  . [DISCONTINUED] methocarbamol (ROBAXIN) 500 MG tablet Take 1 tablet (500 mg total) by mouth at bedtime as needed for muscle spasms.  . [DISCONTINUED] ondansetron (ZOFRAN ODT) 8 MG disintegrating tablet Take 1 tablet (8 mg total) by mouth every 6 (six) hours as needed for nausea or vomiting.  . [DISCONTINUED] tamsulosin (FLOMAX) 0.4 MG CAPS capsule Take 1 capsule (0.4 mg total) by mouth daily after supper.  . NUCYNTA 50 MG tablet Take 1 tablet (50 mg total) by mouth every 6 (six) hours as needed for moderate pain. 1  TO 2 TABS Q 6 HOURS PRN PAIN (Patient not taking: Reported on 12/31/2017)   No facility-administered encounter medications on file as of 12/31/2017.     Allergies (verified) Nortriptyline and Sulfa antibiotics   History: Past Medical History:  Diagnosis Date  . Arthritis   . Basal cell carcinoma    right temple Dr. Elmer Ramp removed 01/2017   . Chicken pox   . Emphysema of lung ([place])    h/o bronchitis not emphysema  . Fatty liver    09/19/15  . Gastric ulcer    neg H pylori   . GERD (gastroesophageal reflux disease)   . Gout   . Hiatal hernia   . Hyperlipidemia     . Hypertension   . Kidney stone    noted CT ab/pelvis 09/19/15 left kidney   . Recurrent UTI   . Syncope    per pt presyncope no LOC felt dizzy/vision distorted and couldnt speak EEG neg, CT head neg 07/31/16, f/u [facility] cardiology with US carotid, echo but no Holter   . Umbilical hernia    noted 09/19/15   . UTI (urinary tract infection)    Past Surgical History:  Procedure Laterality Date  . BREAST BIOPSY Right 1980   cyst removed negative pathology   . EXTRACORPOREAL SHOCK WAVE LITHOTRIPSY Left 09/19/2017   Procedure: EXTRACORPOREAL SHOCK WAVE LITHOTRIPSY (ESWL);  Surgeon: Royston Cowper, MD;  Location: [facility] ORS;  Service: Urology;  Laterality: Left;  . JOINT REPLACEMENT     right knee 11/2015 Dr. Janell Quiet [facility]   Family History  Problem Relation Age of Onset  . Breast cancer Mother 59  . Cancer Mother        breast cancer s/p masectomy lived to be 54   . Hypertension Mother   . Cancer Father        NMSC skin cancer   . Gout Father   . Heart disease Father        s/p bypass surgery age 32 lived 5 months after surgery   . Kidney disease Father        RAS>kidney failure   . Hyperlipidemia Other   . Stroke Other   . Kidney cancer Neg Hx   . Bladder Cancer Neg Hx   . Prolactinoma Neg Hx    Social History   Socioeconomic History  . Marital status: Widowed    Spouse name: Not on file  . Number of children: Not on file  . Years of education: Not on file  . Highest education level: Not on file  Occupational History  . Not on file  Social Needs  . Financial resource strain: Not hard at all  . Food insecurity:    Worry: Never true    Inability: Never true  . Transportation needs:    Medical: No    Non-medical: No  Tobacco Use  . Smoking status: Former Research scientist (life sciences)  . Smokeless tobacco: Never Used  . Tobacco comment: smoked total 30 years quit 20 years ago from 2019 max 1 ppd no FH lung cancer   Substance and Sexual Activity  . Alcohol use: No  . Drug use: Never   . Sexual activity: Not on file  Lifestyle  . Physical activity:    Days per week: 0 days    Minutes per session: Not on file  . Stress: Not at all  Relationships  . Social connections:    Talks on phone: Not on file    Gets  together: Not on file    Attends religious service: Not on file    Active member of club or organization: Not on file    Attends meetings of clubs or organizations: Not on file    Relationship status: Not on file  Other Topics Concern  . Not on file  Social History Narrative   2 years college    Retired Customer service manager     Tobacco Counseling Counseling given: Not Answered Comment: smoked total 30 years quit 20 years ago from 2019 max 1 ppd no FH lung cancer    Clinical Intake:  Pre-visit preparation completed: Yes  Pain : 0-10 Pain Score: 8  Pain Type: Chronic pain Pain Location: Back Pain Orientation: Lower Pain Descriptors / Indicators: Aching, Discomfort, Tightness Pain Onset: More than a month ago Pain Frequency: Constant Pain Relieving Factors: Tylenol. Tramadol. Rest.  Effect of Pain on Daily Activities: She paces herself.   Pain Relieving Factors: Tylenol. Tramadol. Rest.   Nutritional Status: BMI > 30  Obese Diabetes: No  How often do you need to have someone help you when you read instructions, pamphlets, or other written materials from your doctor or pharmacy?: 1 - Never  Interpreter Needed?: No      Activities of Daily Living In your present state of health, do you have any difficulty performing the following activities: 12/31/2017  Hearing? N  Vision? N  Difficulty concentrating or making decisions? N  Walking or climbing stairs? Y  Comment L knee pain. Back pain.  Followed by pcp.   Dressing or bathing? N  Doing errands, shopping? N  Preparing Food and eating ? N  Using the Toilet? N  In the past six months, have you accidently leaked urine? N  Do you have problems with loss of bowel control? N  Managing your Medications? N   Managing your Finances? N  Housekeeping or managing your Housekeeping? N  Some recent data might be hidden     Immunizations and Health Maintenance Immunization History  Administered Date(s) Administered  . Hepatitis A 08/25/2003, 03/19/2017  . Hepatitis B 03/19/2017, 04/22/2017  . Influenza, High Dose Seasonal PF 12/07/2014, 01/10/2017  . Influenza,inj,Quad PF,6+ Mos 12/30/2013, 04/09/2016  . Influenza,inj,quad, With Preservative 12/04/2014  . Influenza-Unspecified 03/17/2012, 12/30/2013, 10/15/2017  . Pneumococcal Conjugate-13 05/11/2013  . Pneumococcal Polysaccharide-23 04/09/2016  . Pneumococcal-Unspecified 03/05/2014  . Td 09/17/2003  . Tdap 05/11/2013  . Zoster 05/16/2002   Health Maintenance Due  Topic Date Due  . COLONOSCOPY  06/13/2014    Patient Care Team: McLean-Scocuzza, Nino Glow, MD as PCP - General (Internal Medicine)  Indicate any recent Medical Services you may have received from other than [facility] providers in the past year (date may be approximate).     Assessment:   This is a routine wellness examination for [place].  The goal of the wellness visit is to assist the patient how to close the gaps in care and create a preventative care plan for the patient.   The roster of all physicians providing medical care to patient is listed in the Snapshot section of the chart.  Osteoporosis risk reviewed.    Safety issues reviewed; Lives alone. Smoke and carbon monoxide detectors in the home. No firearms in the home. Wears seatbelts when driving or riding with others. No violence in the home.  They do not have excessive sun exposure.  Discussed the need for sun protection: hats, long sleeves and the use of sunscreen if there is significant sun exposure.  Patient  is alert, normal appearance, oriented to person/place/and time.  Correctly identified the president of the [place] and recalls of 3/3 words. Performs simple calculations and can read correct time from watch  face.  Displays appropriate judgement.  No new identified risk were noted.  No failures at ADL's or IADL's.    BMI- discussed the importance of a healthy diet, water intake and the benefits of aerobic exercise. She tries to have a healthy diet and plans to increase her protein intake. Her exercise is limited due to pain but plans to increase activity as tolerated. Adequate water intake. Educational material provided.   Dental- every 6 months.  Eye- Visual acuity not assessed per patient preference since they have regular follow up with the ophthalmologist.  Wears corrective lenses.  Sleep patterns- Sleeps well at night.    Patient Concerns: Increased back pain 8 out of 10 on the pain scale 0/10. Taking tylenol and tramadol as directed. Deferred to pcp for same day appointment.   Hearing/Vision screen Hearing Screening Comments: Patient is able to hear conversational tones without difficulty.  No issues reported.   Vision Screening Comments: Followed by Dr. Ellin Mayhew Wears corrective lenses Last OV 2018 Visual acuity not assessed per patient preference since they have regular follow up with the ophthalmologist  Dietary issues and exercise activities discussed: Current Exercise Habits: The patient does not participate in regular exercise at present  Goals      Patient Stated   . Increase physical activity (pt-stated)      Other   . DIET - INCREASE LEAN PROTEINS      Depression Screen PHQ 2/9 Scores 12/31/2017 07/03/2017  PHQ - 2 Score 0 0    Fall Risk Fall Risk  12/31/2017 07/03/2017  Falls in the past year? No No    Cognitive Function: MMSE - Mini Mental State Exam 12/31/2017  Orientation to time 5  Orientation to Place 5  Registration 3  Attention/ Calculation 5  Recall 3  Language- name 2 objects 2  Language- repeat 1  Language- follow 3 step command 3  Language- read & follow direction 1  Write a sentence 1  Copy design 1  Total score 30        Screening  Tests Health Maintenance  Topic Date Due  . COLONOSCOPY  06/13/2014  . MAMMOGRAM  11/02/2019  . TETANUS/TDAP  05/12/2023  . INFLUENZA VACCINE  Completed  . DEXA SCAN  Completed  . Hepatitis C Screening  Completed  . PNA vac Low Risk Adult  Completed     Plan:    End of life planning; Advance aging; Advanced directives discussed. Copy of current HCPOA/Living Will requested upon completion.    I have personally reviewed and noted the following in the patient's chart:   . Medical and social history . Use of alcohol, tobacco or illicit drugs  . Current medications and supplements . Functional ability and status . Nutritional status . Physical activity . Advanced directives . List of other physicians . Hospitalizations, surgeries, and ER visits in previous 12 months . Vitals . Screenings to include cognitive, depression, and falls . Referrals and appointments  In addition, I have reviewed and discussed with patient certain preventive protocols, quality metrics, and best practice recommendations. A written personalized care plan for preventive services as well as general preventive health recommendations were provided to patient.     Varney Biles, LPN   01/75/1025

## 2018-01-01 ENCOUNTER — Other Ambulatory Visit (INDEPENDENT_AMBULATORY_CARE_PROVIDER_SITE_OTHER): Payer: Medicare HMO

## 2018-01-01 DIAGNOSIS — M109 Gout, unspecified: Secondary | ICD-10-CM

## 2018-01-01 DIAGNOSIS — R739 Hyperglycemia, unspecified: Secondary | ICD-10-CM

## 2018-01-01 DIAGNOSIS — Z0184 Encounter for antibody response examination: Secondary | ICD-10-CM | POA: Diagnosis not present

## 2018-01-01 DIAGNOSIS — E559 Vitamin D deficiency, unspecified: Secondary | ICD-10-CM | POA: Diagnosis not present

## 2018-01-01 DIAGNOSIS — I10 Essential (primary) hypertension: Secondary | ICD-10-CM

## 2018-01-01 DIAGNOSIS — Z1159 Encounter for screening for other viral diseases: Secondary | ICD-10-CM | POA: Diagnosis not present

## 2018-01-01 DIAGNOSIS — Z1329 Encounter for screening for other suspected endocrine disorder: Secondary | ICD-10-CM

## 2018-01-01 DIAGNOSIS — Z87442 Personal history of urinary calculi: Secondary | ICD-10-CM

## 2018-01-01 LAB — CBC WITH DIFFERENTIAL/PLATELET
Basophils Absolute: 0 10*3/uL (ref 0.0–0.1)
Basophils Relative: 0.6 % (ref 0.0–3.0)
Eosinophils Absolute: 0.2 10*3/uL (ref 0.0–0.7)
Eosinophils Relative: 2.7 % (ref 0.0–5.0)
HCT: 42.6 % (ref 36.0–46.0)
Hemoglobin: 14.2 g/dL (ref 12.0–15.0)
Lymphocytes Relative: 35.2 % (ref 12.0–46.0)
Lymphs Abs: 2.2 10*3/uL (ref 0.7–4.0)
MCHC: 33.4 g/dL (ref 30.0–36.0)
MCV: 95.2 fl (ref 78.0–100.0)
Monocytes Absolute: 0.4 10*3/uL (ref 0.1–1.0)
Monocytes Relative: 5.9 % (ref 3.0–12.0)
Neutro Abs: 3.4 10*3/uL (ref 1.4–7.7)
Neutrophils Relative %: 55.6 % (ref 43.0–77.0)
Platelets: 201 10*3/uL (ref 150.0–400.0)
RBC: 4.47 Mil/uL (ref 3.87–5.11)
RDW: 15.6 % — ABNORMAL HIGH (ref 11.5–15.5)
WBC: 6.2 10*3/uL (ref 4.0–10.5)

## 2018-01-01 LAB — LDL CHOLESTEROL, DIRECT: Direct LDL: 87 mg/dL

## 2018-01-01 LAB — COMPREHENSIVE METABOLIC PANEL WITH GFR
ALT: 23 U/L (ref 0–35)
AST: 14 U/L (ref 0–37)
Albumin: 4.3 g/dL (ref 3.5–5.2)
Alkaline Phosphatase: 76 U/L (ref 39–117)
BUN: 25 mg/dL — ABNORMAL HIGH (ref 6–23)
CO2: 27 meq/L (ref 19–32)
Calcium: 9.8 mg/dL (ref 8.4–10.5)
Chloride: 101 meq/L (ref 96–112)
Creatinine, Ser: 0.87 mg/dL (ref 0.40–1.20)
GFR: 68.36 mL/min
Glucose, Bld: 87 mg/dL (ref 70–99)
Potassium: 4.1 meq/L (ref 3.5–5.1)
Sodium: 140 meq/L (ref 135–145)
Total Bilirubin: 0.6 mg/dL (ref 0.2–1.2)
Total Protein: 6.8 g/dL (ref 6.0–8.3)

## 2018-01-01 LAB — LIPID PANEL
Cholesterol: 204 mg/dL — ABNORMAL HIGH (ref 0–200)
HDL: 54.3 mg/dL
NonHDL: 150.07
Total CHOL/HDL Ratio: 4
Triglycerides: 297 mg/dL — ABNORMAL HIGH (ref 0.0–149.0)
VLDL: 59.4 mg/dL — ABNORMAL HIGH (ref 0.0–40.0)

## 2018-01-01 LAB — VITAMIN D 25 HYDROXY (VIT D DEFICIENCY, FRACTURES): VITD: 52.73 ng/mL (ref 30.00–100.00)

## 2018-01-01 LAB — HEMOGLOBIN A1C: Hgb A1c MFr Bld: 5.8 % (ref 4.6–6.5)

## 2018-01-01 LAB — TSH: TSH: 1.65 u[IU]/mL (ref 0.35–4.50)

## 2018-01-01 LAB — URIC ACID: Uric Acid, Serum: 4.5 mg/dL (ref 2.4–7.0)

## 2018-01-01 NOTE — Addendum Note (Signed)
Addended by: Arby Barrette on: 01/01/2018 08:53 AM   Modules accepted: Orders

## 2018-01-02 LAB — URINALYSIS, ROUTINE W REFLEX MICROSCOPIC
Bilirubin, UA: NEGATIVE
Glucose, UA: NEGATIVE
Ketones, UA: NEGATIVE
Nitrite, UA: NEGATIVE
Protein, UA: NEGATIVE
RBC, UA: NEGATIVE
Specific Gravity, UA: 1.027 (ref 1.005–1.030)
Urobilinogen, Ur: 0.2 mg/dL (ref 0.2–1.0)
pH, UA: 6 (ref 5.0–7.5)

## 2018-01-02 LAB — MEASLES/MUMPS/RUBELLA IMMUNITY
Mumps IgG: >300 [AU]/ml
Rubella: 3.87 {index}
Rubeola IgG: 143 [AU]/ml

## 2018-01-02 LAB — MICROSCOPIC EXAMINATION
Casts: NONE SEEN /LPF
Epithelial Cells (non renal): >10 /HPF — AB (ref 0–10)

## 2018-01-24 ENCOUNTER — Ambulatory Visit
Admission: RE | Admit: 2018-01-24 | Discharge: 2018-01-24 | Disposition: A | Discharge status: Home / Self Care | Payer: Medicare HMO | Source: Ambulatory Visit | Attending: Internal Medicine | Admitting: Internal Medicine

## 2018-01-24 DIAGNOSIS — M5416 Radiculopathy, lumbar region: Secondary | ICD-10-CM

## 2018-01-24 DIAGNOSIS — M5116 Intervertebral disc disorders with radiculopathy, lumbar region: Secondary | ICD-10-CM | POA: Insufficient documentation

## 2018-01-24 DIAGNOSIS — M48061 Spinal stenosis, lumbar region without neurogenic claudication: Secondary | ICD-10-CM | POA: Insufficient documentation

## 2018-01-24 DIAGNOSIS — M545 Low back pain: Secondary | ICD-10-CM | POA: Diagnosis not present

## 2018-01-24 DIAGNOSIS — M5136 Other intervertebral disc degeneration, lumbar region: Secondary | ICD-10-CM

## 2018-01-24 DIAGNOSIS — M51369 Other intervertebral disc degeneration, lumbar region without mention of lumbar back pain or lower extremity pain: Secondary | ICD-10-CM

## 2018-01-24 DIAGNOSIS — M5126 Other intervertebral disc displacement, lumbar region: Secondary | ICD-10-CM

## 2018-01-27 ENCOUNTER — Encounter: Payer: Self-pay | Admitting: *Deleted

## 2018-01-28 ENCOUNTER — Other Ambulatory Visit: Payer: Self-pay | Admitting: Internal Medicine

## 2018-01-28 DIAGNOSIS — R937 Abnormal findings on diagnostic imaging of other parts of musculoskeletal system: Secondary | ICD-10-CM

## 2018-01-28 DIAGNOSIS — M5416 Radiculopathy, lumbar region: Secondary | ICD-10-CM

## 2018-01-29 ENCOUNTER — Encounter: Payer: Self-pay | Admitting: Internal Medicine

## 2018-01-29 ENCOUNTER — Ambulatory Visit (INDEPENDENT_AMBULATORY_CARE_PROVIDER_SITE_OTHER): Payer: Medicare HMO | Admitting: Internal Medicine

## 2018-01-29 VITALS — BP 150/74 | HR 90 | Temp 97.4°F | Ht 65.0 in | Wt 220.4 lb

## 2018-01-29 DIAGNOSIS — R8290 Unspecified abnormal findings in urine: Secondary | ICD-10-CM | POA: Diagnosis not present

## 2018-01-29 DIAGNOSIS — I10 Essential (primary) hypertension: Secondary | ICD-10-CM

## 2018-01-29 DIAGNOSIS — M5126 Other intervertebral disc displacement, lumbar region: Secondary | ICD-10-CM | POA: Diagnosis not present

## 2018-01-29 DIAGNOSIS — M5416 Radiculopathy, lumbar region: Secondary | ICD-10-CM | POA: Diagnosis not present

## 2018-01-29 DIAGNOSIS — M5136 Other intervertebral disc degeneration, lumbar region: Secondary | ICD-10-CM | POA: Diagnosis not present

## 2018-01-29 DIAGNOSIS — M25552 Pain in left hip: Secondary | ICD-10-CM | POA: Diagnosis not present

## 2018-01-29 DIAGNOSIS — R937 Abnormal findings on diagnostic imaging of other parts of musculoskeletal system: Secondary | ICD-10-CM | POA: Insufficient documentation

## 2018-01-29 DIAGNOSIS — M25551 Pain in right hip: Secondary | ICD-10-CM | POA: Diagnosis not present

## 2018-01-29 DIAGNOSIS — Z1231 Encounter for screening mammogram for malignant neoplasm of breast: Secondary | ICD-10-CM | POA: Diagnosis not present

## 2018-01-29 DIAGNOSIS — M8580 Other specified disorders of bone density and structure, unspecified site: Secondary | ICD-10-CM

## 2018-01-29 DIAGNOSIS — E785 Hyperlipidemia, unspecified: Secondary | ICD-10-CM | POA: Diagnosis not present

## 2018-01-29 DIAGNOSIS — N390 Urinary tract infection, site not specified: Secondary | ICD-10-CM

## 2018-01-29 DIAGNOSIS — M51369 Other intervertebral disc degeneration, lumbar region without mention of lumbar back pain or lower extremity pain: Secondary | ICD-10-CM | POA: Insufficient documentation

## 2018-01-29 MED ORDER — TRAMADOL HCL 50 MG PO TABS: 50.0000 mg | ORAL_TABLET | Freq: Two times a day (BID) | ORAL | 2 refills | Status: DC

## 2018-01-29 MED ORDER — ATORVASTATIN CALCIUM 40 MG PO TABS: 40.0000 mg | ORAL_TABLET | Freq: Every day | ORAL | 3 refills | Status: DC

## 2018-01-29 NOTE — Progress Notes (Signed)
Pre visit review using our clinic review tool, if applicable. No additional management support is needed unless otherwise documented below in the visit note. 

## 2018-01-29 NOTE — Progress Notes (Signed)
Chief Complaint  Patient presents with  . Follow-up   F/u  1. Low back pain with radiculopathy R>L leg 8/10 today limits quality of life standing, bending makes worse tramadol 50 bid helps  but not completely and takes with tylenol also does otc lidocaine patch, biofreeze and salonpas which help a little  MRI 01/24/18 1. Worsening degenerative disc disease compared to MRI 02/06/2009. This results in multilevel lateral recess narrowing at the L2-L5 levels, without central spinal canal stenosis.  2. Slight worsening of moderate right L5 neural foraminal stenosis. 2. HTN BP elevated today likely 2/2 #1 on maxzide 37.5-25 normally controlled  3. Reviewed labs 01/01/18   Review of Systems  Constitutional: Negative for weight loss.  HENT: Negative for hearing loss.   Eyes: Negative for blurred vision.  Respiratory: Negative for shortness of breath.   Cardiovascular: Negative for chest pain.  Gastrointestinal: Negative for abdominal pain.  Genitourinary: Negative for frequency.  Musculoskeletal: Positive for back pain.  Skin: Negative for rash.  Neurological: Positive for sensory change.  Psychiatric/Behavioral: Negative for depression and memory loss.   Past Medical History:  Diagnosis Date  . Arthritis   . Basal cell carcinoma    right temple Dr. Elmer Ramp removed 01/2017   . Chicken pox   . Emphysema of lung ([place])    h/o bronchitis not emphysema  . Fatty liver    09/19/15  . Gastric ulcer    neg H pylori   . GERD (gastroesophageal reflux disease)   . Gout   . Hiatal hernia   . Hyperlipidemia   . Hypertension   . Kidney stone    noted CT ab/pelvis 09/19/15 left kidney   . Kidney stone    Dr. Rogers Blocker 2019   . Lumbar herniated disc   . Recurrent UTI   . Syncope    per pt presyncope no LOC felt dizzy/vision distorted and couldnt speak EEG neg, CT head neg 07/31/16, f/u [facility]ology with US carotid, echo but no Holter   . Umbilical hernia    noted 09/19/15   . UTI  (urinary tract infection)    Past Surgical History:  Procedure Laterality Date  . BREAST BIOPSY Right 1980   cyst removed negative pathology   . EXTRACORPOREAL SHOCK WAVE LITHOTRIPSY Left 09/19/2017   Procedure: EXTRACORPOREAL SHOCK WAVE LITHOTRIPSY (ESWL);  Surgeon: Royston Cowper, MD;  Location: [facility] ORS;  Service: Urology;  Laterality: Left;  . JOINT REPLACEMENT     right knee 11/2015 Dr. Janell Quiet [facility]   Family History  Problem Relation Age of Onset  . Breast cancer Mother 106  . Cancer Mother        breast cancer s/p masectomy lived to be 73   . Hypertension Mother   . Cancer Father        NMSC skin cancer   . Gout Father   . Heart disease Father        s/p bypass surgery age 44 lived 5 months after surgery   . Kidney disease Father        RAS>kidney failure   . Hyperlipidemia Other   . Stroke Other   . Kidney cancer Neg Hx   . Bladder Cancer Neg Hx   . Prolactinoma Neg Hx    Social History   Socioeconomic History  . Marital status: Widowed    Spouse name: Not on file  . Number of children: Not on file  . Years of education: Not on file  .  Highest education level: Not on file  Occupational History  . Not on file  Social Needs  . Financial resource strain: Not hard at all  . Food insecurity:    Worry: Never true    Inability: Never true  . Transportation needs:    Medical: No    Non-medical: No  Tobacco Use  . Smoking status: Former Research scientist (life sciences)  . Smokeless tobacco: Never Used  . Tobacco comment: smoked total 30 years quit 20 years ago from 2019 max 1 ppd no FH lung cancer   Substance and Sexual Activity  . Alcohol use: No  . Drug use: Never  . Sexual activity: Not on file  Lifestyle  . Physical activity:    Days per week: 0 days    Minutes per session: Not on file  . Stress: Not at all  Relationships  . Social connections:    Talks on phone: Not on file    Gets together: Not on file    Attends religious service: Not on file    Active member  of club or organization: Not on file    Attends meetings of clubs or organizations: Not on file    Relationship status: Not on file  . Intimate partner violence:    Fear of current or ex partner: Not on file    Emotionally abused: Not on file    Physically abused: Not on file    Forced sexual activity: Not on file  Other Topics Concern  . Not on file  Social History Narrative   2 years college    Retired Customer service manager    Current Meds  Medication Sig  . acetaminophen (TYLENOL) 500 MG tablet Take 1,000 mg by mouth 2 (two) times daily.   Marland Kitchen allopurinol (ZYLOPRIM) 300 MG tablet Take 1 tablet (300 mg total) by mouth daily.  Marland Kitchen atorvastatin (LIPITOR) 20 MG tablet Take 1 tablet (20 mg total) by mouth daily at 6 PM.  . calcium carbonate 1250 MG capsule Take 1,250 mg by mouth 2 (two) times daily with a meal.  . Cholecalciferol (VITAMIN D3) 5000 units CAPS Take by mouth daily.  Marland Kitchen CRANBERRY PO Take 4,200 mg by mouth daily.  . Cyanocobalamin (B-12) 5000 MCG CAPS Take by mouth daily.  Marland Kitchen docusate sodium (COLACE) 100 MG capsule Take 2 capsules (200 mg total) by mouth 2 (two) times daily.  Marland Kitchen estradiol (ESTRACE VAGINAL) 0.1 MG/GM vaginal cream Apply 0.61m (pea-sized amount)  just inside the vaginal introitus with a finger-tip on Monday, Wednesday and Friday nights.  . gabapentin (NEURONTIN) 100 MG capsule TAKE 1 CAPSULE (100 MG TOTAL) BY MOUTH AT BEDTIME.  . Magnesium 500 MG CAPS Take 1 tablet by mouth at bedtime.  . methocarbamol (ROBAXIN) 750 MG tablet Take 1 tablet (750 mg total) by mouth at bedtime as needed for muscle spasms.  . pantoprazole (PROTONIX) 40 MG tablet Take 1 tablet (40 mg total) by mouth daily.  . potassium chloride (MICRO-K) 10 MEQ CR capsule Take 1 capsule (10 mEq total) by mouth 2 (two) times daily.  . traMADol (ULTRAM) 50 MG tablet Take 1 tablet (50 mg total) by mouth 2 (two) times daily.  .Marland Kitchentriamterene-hydrochlorothiazide (MAXZIDE-25) 37.5-25 MG tablet Take 1 tablet by mouth daily.    Allergies  Allergen Reactions  . Nortriptyline Nausea Only  . Sulfa Antibiotics Rash   Recent Results (from the past 2160 hour(s))  Urinalysis, Routine w reflex microscopic     Status: Abnormal   Collection Time: 01/01/18  8:53  AM  Result Value Ref Range   Specific Gravity, UA 1.027 1.005 - 1.030   pH, UA 6.0 5.0 - 7.5   Color, UA Yellow Yellow   Appearance Ur Cloudy (A) Clear   Leukocytes, UA 1+ (A) Negative   Protein, UA Negative Negative/Trace   Glucose, UA Negative Negative   Ketones, UA Negative Negative   RBC, UA Negative Negative   Bilirubin, UA Negative Negative   Urobilinogen, Ur 0.2 0.2 - 1.0 mg/dL   Nitrite, UA Negative Negative   Microscopic Examination See below:     Comment: Microscopic was indicated and was performed.  Vitamin D (25 hydroxy)     Status: None   Collection Time: 01/01/18  8:53 AM  Result Value Ref Range   VITD 52.73 30.00 - 100.00 ng/mL  Measles/Mumps/Rubella Immunity     Status: None   Collection Time: 01/01/18  8:53 AM  Result Value Ref Range   Rubeola IgG 143.00 AU/mL    Comment: AU/mL            Interpretation -----            -------------- <25.00           Negative 25.00-29.99      Equivocal >29.99           Positive . A positive result indicates that the patient has antibody to measles virus. It does not differentiate  between an active or past infection. The clinical  diagnosis must be interpreted in conjunction with  clinical signs and symptoms of the patient.    Mumps IgG >300.00 AU/mL    Comment:  AU/mL           Interpretation -------         ---------------- <9.00             Negative 9.00-10.99        Equivocal >10.99            Positive A positive result indicates that the patient has  antibody to mumps virus. It does not differentiate between an  active or past infection. The clinical diagnosis must be interpreted in conjunction with clinical signs and symptoms of the patient. .    Rubella 3.87 index     Comment:     Index            Interpretation     -----            --------------       <0.90            Not consistent with Immunity     0.90-0.99        Equivocal     > or = 1.00      Consistent with Immunity  . The presence of rubella IgG antibody suggests  immunization or past or current infection with rubella virus.   Uric acid     Status: None   Collection Time: 01/01/18  8:53 AM  Result Value Ref Range   Uric Acid, Serum 4.5 2.4 - 7.0 mg/dL  Hemoglobin A1c     Status: None   Collection Time: 01/01/18  8:53 AM  Result Value Ref Range   Hgb A1c MFr Bld 5.8 4.6 - 6.5 %    Comment: Glycemic Control Guidelines for People with Diabetes:Non Diabetic:  <6%Goal of Therapy: <7%Additional Action Suggested:  >8%   TSH     Status: None   Collection Time: 01/01/18  8:53 AM  Result  Value Ref Range   TSH 1.65 0.35 - 4.50 uIU/mL  Lipid panel     Status: Abnormal   Collection Time: 01/01/18  8:53 AM  Result Value Ref Range   Cholesterol 204 (H) 0 - 200 mg/dL    Comment: ATP III Classification       Desirable:  < 200 mg/dL               Borderline High:  200 - 239 mg/dL          High:  > = 240 mg/dL   Triglycerides 297.0 (H) 0.0 - 149.0 mg/dL    Comment: Normal:  <150 mg/dLBorderline High:  150 - 199 mg/dL   HDL 54.30 >39.00 mg/dL   VLDL 59.4 (H) 0.0 - 40.0 mg/dL   Total CHOL/HDL Ratio 4     Comment:                Men          Women1/2 Average Risk     3.4          3.3Average Risk          5.0          4.42X Average Risk          9.6          7.13X Average Risk          15.0          11.0                       NonHDL 150.07     Comment: NOTE:  Non-HDL goal should be 30 mg/dL higher than patient's LDL goal (i.e. LDL goal of < 70 mg/dL, would have non-HDL goal of < 100 mg/dL)  CBC with Differential/Platelet     Status: Abnormal   Collection Time: 01/01/18  8:53 AM  Result Value Ref Range   WBC 6.2 4.0 - 10.5 K/uL   RBC 4.47 3.87 - 5.11 Mil/uL   Hemoglobin 14.2 12.0 - 15.0 g/dL   HCT 42.6  36.0 - 46.0 %   MCV 95.2 78.0 - 100.0 fl   MCHC 33.4 30.0 - 36.0 g/dL   RDW 15.6 (H) 11.5 - 15.5 %   Platelets 201.0 150.0 - 400.0 K/uL   Neutrophils Relative % 55.6 43.0 - 77.0 %   Lymphocytes Relative 35.2 12.0 - 46.0 %   Monocytes Relative 5.9 3.0 - 12.0 %   Eosinophils Relative 2.7 0.0 - 5.0 %   Basophils Relative 0.6 0.0 - 3.0 %   Neutro Abs 3.4 1.4 - 7.7 K/uL   Lymphs Abs 2.2 0.7 - 4.0 K/uL   Monocytes Absolute 0.4 0.1 - 1.0 K/uL   Eosinophils Absolute 0.2 0.0 - 0.7 K/uL   Basophils Absolute 0.0 0.0 - 0.1 K/uL  Comprehensive metabolic panel     Status: Abnormal   Collection Time: 01/01/18  8:53 AM  Result Value Ref Range   Sodium 140 135 - 145 mEq/L   Potassium 4.1 3.5 - 5.1 mEq/L   Chloride 101 96 - 112 mEq/L   CO2 27 19 - 32 mEq/L   Glucose, Bld 87 70 - 99 mg/dL   BUN 25 (H) 6 - 23 mg/dL   Creatinine, Ser 0.87 0.40 - 1.20 mg/dL   Total Bilirubin 0.6 0.2 - 1.2 mg/dL   Alkaline Phosphatase 76 39 - 117 U/L   AST 14 0 - 37 U/L  ALT 23 0 - 35 U/L   Total Protein 6.8 6.0 - 8.3 g/dL   Albumin 4.3 3.5 - 5.2 g/dL   Calcium 9.8 8.4 - 10.5 mg/dL   GFR 68.36 >60.00 mL/min  LDL cholesterol, direct     Status: None   Collection Time: 01/01/18  8:53 AM  Result Value Ref Range   Direct LDL 87.0 mg/dL    Comment: Optimal:  <100 mg/dLNear or Above Optimal:  100-129 mg/dLBorderline High:  130-159 mg/dLHigh:  160-189 mg/dLVery High:  >190 mg/dL  Microscopic Examination     Status: Abnormal   Collection Time: 01/01/18  8:53 AM  Result Value Ref Range   WBC, UA 6-10 (A) 0 - 5 /hpf   RBC, UA 0-2 0 - 2 /hpf   Epithelial Cells (non renal) >10 (A) 0 - 10 /hpf   Casts None seen None seen /lpf   Mucus, UA Present Not Estab.   Bacteria, UA Many (A) None seen/Few   Objective  Body mass index is 36.68 kg/m. Wt Readings from Last 3 Encounters:  01/29/18 220 lb 6.4 oz (100 kg)  12/31/17 220 lb 6.4 oz (100 kg)  12/31/17 219 lb 12.8 oz (99.7 kg)   Temp Readings from Last 3 Encounters:   01/29/18 (!) 97.4 F (36.3 C) (Oral)  12/31/17 98.2 F (36.8 C) (Oral)  12/31/17 98.3 F (36.8 C) (Oral)   BP Readings from Last 3 Encounters:  01/29/18 (!) 150/74  12/31/17 138/70  12/31/17 122/72   Pulse Readings from Last 3 Encounters:  01/29/18 90  12/31/17 76  12/31/17 79    Physical Exam  Constitutional: She is oriented to person, place, and time. Vital signs are normal. She appears well-developed and well-nourished. She is cooperative.  HENT:  Head: Normocephalic and atraumatic.  Mouth/Throat: Oropharynx is clear and moist and mucous membranes are normal.  Eyes: Pupils are equal, round, and reactive to light. Conjunctivae are normal.  Cardiovascular: Normal rate, regular rhythm and normal heart sounds.  Pulmonary/Chest: Effort normal and breath sounds normal.  Musculoskeletal:       Lumbar back: She exhibits tenderness.  Neurological: She is alert and oriented to person, place, and time. Gait normal.  Skin: Skin is warm, dry and intact.  Psychiatric: She has a normal mood and affect. Her speech is normal and behavior is normal. Judgment and thought content normal. Cognition and memory are normal.  Nursing note and vitals reviewed.   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 HM Plan   1. Refer Dr. Sharlet Salina [employer] tramadol 50 tid prn resigned contract tid dosing from bid 2. Cont med on lipitor 40 mg qd consider add zeta if lipid doesn't improve recheck 2-5 months 3. 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 Was due 10/17/17, also had hep A 03/19/17 2nd dose due in 6-12 months h/o fatty liver   MMR immune  Colonoscopy overdue disc reconsider another today pt wants to wait   ? Date of last pap per pt no h/o abnormal ptper chart h/o abnormal papDr. Vickki Muff has record. Last  I can see 11/21/01 neg pap +BV   Mammogram 11/13/17 2.5 cm cyst right breast f/u in 1 year will need to reorder in future    DEXA 04/03/12 osteopenia rec calcium 600 mg  bid and on vit D3 5000 qd. Vit D normal on 5000 IU D3 qd  -consider repeat DEXA ordered today   Former smoker total 30 years max 1 ppd no FH lung cancer quit 20 years ago from 2019 Hep C neg 04/09/16   H/o skin cancer Dr. Kellie Moor due to see again 06/2018  Provider: Dr. Olivia Mackie McLean-Scocuzza-[facility]

## 2018-01-29 NOTE — Patient Instructions (Addendum)
Dr. Sharlet Salina PM&R [facility]-back injection in [facility] for steroid shot   Neurosurgeon Dr. Daun Peacock or Dr. Deetta Perla   Degenerative Disk Disease Degenerative disk disease is a condition caused by the changes that occur in spinal disks as you grow older. Spinal disks are soft and compressible disks located between the bones of your spine (vertebrae). These disks act like shock absorbers. Degenerative disk disease can affect the whole spine. However, the neck and lower back are most commonly affected. Many changes can occur in the spinal disks with aging, such as:  The spinal disks may dry and shrink.  Small tears may occur in the tough, outer covering of the disk (annulus).  The disk space may become smaller due to loss of water.  Abnormal growths in the bone (spurs) may occur. This can put pressure on the nerve roots exiting the spinal canal, causing pain.  The spinal canal may become narrowed.  What increases the risk?  Being overweight.  Having a family history of degenerative disk disease.  Smoking.  There is increased risk if you are doing heavy lifting or have a sudden injury. What are the signs or symptoms? Symptoms vary from person to person and may include:  Pain that varies in intensity. Some people have no pain, while others have severe pain. The location of the pain depends on the part of your backbone that is affected. ? You will have neck or arm pain if a disk in the neck area is affected. ? You will have pain in your back, buttocks, or legs if a disk in the lower back is affected.  Pain that becomes worse while bending, reaching up, or with twisting movements.  Pain that may start gradually and then get worse as time passes. It may also start after a major or minor injury.  Numbness or tingling in the arms or legs.  How is this diagnosed? Your health care provider will ask you about your symptoms and about activities or habits that may cause the pain. He or  she may also ask about any injuries, diseases, or treatments you have had. Your health care provider will examine you to check for the range of movement that is possible in the affected area, to check for strength in your extremities, and to check for sensation in the areas of the arms and legs supplied by different nerve roots. You may also have:  An X-ray of the spine.  Other imaging tests, such as MRI.  How is this treated? Your health care provider will advise you on the best plan for treatment. Treatment may include:  Medicines.  Rehabilitation exercises.  Follow these instructions at home:  Follow proper lifting and walking techniques as advised by your health care provider.  Maintain good posture.  Exercise regularly as advised by your health care provider.  Perform relaxation exercises.  Change your sitting, standing, and sleeping habits as advised by your health care provider.  Change positions frequently.  Lose weight or maintain a healthy weight as advised by your health care provider.  Do not use any tobacco products, including cigarettes, chewing tobacco, or electronic cigarettes. If you need help quitting, ask your health care provider.  Wear supportive footwear.  Take medicines only as directed by your health care provider. Contact a health care provider if:  Your pain does not go away within 1-4 weeks.  You have significant appetite or weight loss. Get help right away if:  Your pain is severe.  You notice  weakness in your arms, hands, or legs.  You begin to lose control of your bladder or bowel movements.  You have fevers or night sweats. This information is not intended to replace advice given to you by your health care provider. Make sure you discuss any questions you have with your health care provider. Document Released: 12/17/2006 Document Revised: 07/28/2015 Document Reviewed: 06/23/2013 Elsevier Interactive Patient Education  [employer].

## 2018-01-31 LAB — URINALYSIS, ROUTINE W REFLEX MICROSCOPIC
Bilirubin Urine: NEGATIVE
Glucose, UA: NEGATIVE
Hgb urine dipstick: NEGATIVE
Hyaline Cast: NONE SEEN /LPF
Ketones, ur: NEGATIVE
Nitrite: POSITIVE — AB
Specific Gravity, Urine: 1.024 (ref 1.001–1.03)
WBC, UA: >=60 /HPF — AB (ref 0–5)
pH: 6 (ref 5.0–8.0)

## 2018-01-31 LAB — URINE CULTURE
MICRO NUMBER:: 91430784
SPECIMEN QUALITY:: ADEQUATE

## 2018-02-03 ENCOUNTER — Other Ambulatory Visit: Payer: Self-pay | Admitting: Internal Medicine

## 2018-02-03 DIAGNOSIS — N3000 Acute cystitis without hematuria: Secondary | ICD-10-CM

## 2018-02-03 MED ORDER — CIPROFLOXACIN HCL 500 MG PO TABS: 500.0000 mg | ORAL_TABLET | Freq: Two times a day (BID) | ORAL | 0 refills | Status: DC

## 2018-02-05 ENCOUNTER — Other Ambulatory Visit: Payer: Self-pay | Admitting: Internal Medicine

## 2018-02-05 DIAGNOSIS — M5126 Other intervertebral disc displacement, lumbar region: Secondary | ICD-10-CM

## 2018-02-05 DIAGNOSIS — M5416 Radiculopathy, lumbar region: Secondary | ICD-10-CM

## 2018-02-05 MED ORDER — METHOCARBAMOL 750 MG PO TABS: 750.0000 mg | ORAL_TABLET | Freq: Every evening | ORAL | 5 refills | Status: DC | PRN

## 2018-02-11 DIAGNOSIS — M5136 Other intervertebral disc degeneration, lumbar region: Secondary | ICD-10-CM | POA: Diagnosis not present

## 2018-02-11 DIAGNOSIS — M5416 Radiculopathy, lumbar region: Secondary | ICD-10-CM | POA: Diagnosis not present

## 2018-02-28 DIAGNOSIS — M5136 Other intervertebral disc degeneration, lumbar region: Secondary | ICD-10-CM | POA: Diagnosis not present

## 2018-02-28 DIAGNOSIS — M5416 Radiculopathy, lumbar region: Secondary | ICD-10-CM | POA: Diagnosis not present

## 2018-02-28 DIAGNOSIS — M48062 Spinal stenosis, lumbar region with neurogenic claudication: Secondary | ICD-10-CM | POA: Diagnosis not present

## 2018-03-10 ENCOUNTER — Other Ambulatory Visit: Payer: Self-pay | Admitting: Internal Medicine

## 2018-03-10 ENCOUNTER — Telehealth: Payer: Self-pay | Admitting: Internal Medicine

## 2018-03-10 ENCOUNTER — Encounter: Payer: Self-pay | Admitting: Internal Medicine

## 2018-03-10 DIAGNOSIS — E785 Hyperlipidemia, unspecified: Secondary | ICD-10-CM

## 2018-03-10 MED ORDER — ATORVASTATIN CALCIUM 40 MG PO TABS: 40.0000 mg | ORAL_TABLET | Freq: Every day | ORAL | 3 refills | Status: DC

## 2018-03-10 NOTE — Telephone Encounter (Signed)
Copied from [place] [phone]. Topic: Quick Communication - See Telephone Encounter >> Mar 10, 2018  3:49 PM Bea Graff, NT wrote: CRM for notification. See Telephone encounter for: 03/10/18. Pt states that her pharmacy did not receive the rx in November for her atorvastatin (LIPITOR) 40 MG tablet and she is wanting to see if this can be refilled.

## 2018-03-19 ENCOUNTER — Other Ambulatory Visit: Payer: Self-pay | Admitting: Internal Medicine

## 2018-03-19 DIAGNOSIS — M109 Gout, unspecified: Secondary | ICD-10-CM

## 2018-03-19 DIAGNOSIS — I10 Essential (primary) hypertension: Secondary | ICD-10-CM

## 2018-03-19 DIAGNOSIS — K219 Gastro-esophageal reflux disease without esophagitis: Secondary | ICD-10-CM

## 2018-03-19 DIAGNOSIS — E876 Hypokalemia: Secondary | ICD-10-CM

## 2018-03-19 MED ORDER — PANTOPRAZOLE SODIUM 40 MG PO TBEC: 40.0000 mg | DELAYED_RELEASE_TABLET | Freq: Every day | ORAL | 3 refills | Status: DC

## 2018-03-19 MED ORDER — ALLOPURINOL 300 MG PO TABS: 300.0000 mg | ORAL_TABLET | Freq: Every day | ORAL | 3 refills | Status: DC

## 2018-03-19 MED ORDER — POTASSIUM CHLORIDE ER 10 MEQ PO CPCR: 10.0000 meq | ORAL_CAPSULE | Freq: Two times a day (BID) | ORAL | 3 refills | Status: DC

## 2018-03-28 DIAGNOSIS — M5136 Other intervertebral disc degeneration, lumbar region: Secondary | ICD-10-CM | POA: Diagnosis not present

## 2018-03-28 DIAGNOSIS — M48062 Spinal stenosis, lumbar region with neurogenic claudication: Secondary | ICD-10-CM | POA: Diagnosis not present

## 2018-03-28 DIAGNOSIS — M5416 Radiculopathy, lumbar region: Secondary | ICD-10-CM | POA: Diagnosis not present

## 2018-04-18 DIAGNOSIS — M48062 Spinal stenosis, lumbar region with neurogenic claudication: Secondary | ICD-10-CM | POA: Diagnosis not present

## 2018-04-18 DIAGNOSIS — M5416 Radiculopathy, lumbar region: Secondary | ICD-10-CM | POA: Diagnosis not present

## 2018-04-18 DIAGNOSIS — M5136 Other intervertebral disc degeneration, lumbar region: Secondary | ICD-10-CM | POA: Diagnosis not present

## 2018-04-29 DIAGNOSIS — M48062 Spinal stenosis, lumbar region with neurogenic claudication: Secondary | ICD-10-CM | POA: Diagnosis not present

## 2018-04-29 DIAGNOSIS — M5136 Other intervertebral disc degeneration, lumbar region: Secondary | ICD-10-CM | POA: Diagnosis not present

## 2018-05-02 DIAGNOSIS — M5136 Other intervertebral disc degeneration, lumbar region: Secondary | ICD-10-CM | POA: Diagnosis not present

## 2018-05-02 DIAGNOSIS — M545 Low back pain: Secondary | ICD-10-CM | POA: Diagnosis not present

## 2018-05-07 DIAGNOSIS — M5136 Other intervertebral disc degeneration, lumbar region: Secondary | ICD-10-CM | POA: Diagnosis not present

## 2018-05-07 DIAGNOSIS — M545 Low back pain: Secondary | ICD-10-CM | POA: Diagnosis not present

## 2018-05-09 DIAGNOSIS — M545 Low back pain: Secondary | ICD-10-CM | POA: Diagnosis not present

## 2018-05-09 DIAGNOSIS — M5136 Other intervertebral disc degeneration, lumbar region: Secondary | ICD-10-CM | POA: Diagnosis not present

## 2018-05-12 DIAGNOSIS — M5136 Other intervertebral disc degeneration, lumbar region: Secondary | ICD-10-CM | POA: Diagnosis not present

## 2018-05-12 DIAGNOSIS — M545 Low back pain: Secondary | ICD-10-CM | POA: Diagnosis not present

## 2018-05-16 DIAGNOSIS — M545 Low back pain: Secondary | ICD-10-CM | POA: Diagnosis not present

## 2018-05-16 DIAGNOSIS — M5136 Other intervertebral disc degeneration, lumbar region: Secondary | ICD-10-CM | POA: Diagnosis not present

## 2018-05-20 DIAGNOSIS — M5136 Other intervertebral disc degeneration, lumbar region: Secondary | ICD-10-CM | POA: Diagnosis not present

## 2018-05-20 DIAGNOSIS — M545 Low back pain: Secondary | ICD-10-CM | POA: Diagnosis not present

## 2018-05-23 DIAGNOSIS — M5136 Other intervertebral disc degeneration, lumbar region: Secondary | ICD-10-CM | POA: Diagnosis not present

## 2018-05-23 DIAGNOSIS — M545 Low back pain: Secondary | ICD-10-CM | POA: Diagnosis not present

## 2018-05-30 DIAGNOSIS — M5136 Other intervertebral disc degeneration, lumbar region: Secondary | ICD-10-CM | POA: Diagnosis not present

## 2018-07-08 ENCOUNTER — Other Ambulatory Visit: Payer: Self-pay | Admitting: Internal Medicine

## 2018-07-08 DIAGNOSIS — R937 Abnormal findings on diagnostic imaging of other parts of musculoskeletal system: Secondary | ICD-10-CM

## 2018-07-08 DIAGNOSIS — M5126 Other intervertebral disc displacement, lumbar region: Secondary | ICD-10-CM

## 2018-07-08 DIAGNOSIS — M25552 Pain in left hip: Secondary | ICD-10-CM

## 2018-07-08 DIAGNOSIS — M5416 Radiculopathy, lumbar region: Secondary | ICD-10-CM

## 2018-07-08 DIAGNOSIS — M25551 Pain in right hip: Secondary | ICD-10-CM

## 2018-07-08 MED ORDER — TRAMADOL HCL 50 MG PO TABS: 50.0000 mg | ORAL_TABLET | Freq: Three times a day (TID) | ORAL | 2 refills | Status: DC | PRN

## 2018-07-24 DIAGNOSIS — L570 Actinic keratosis: Secondary | ICD-10-CM | POA: Diagnosis not present

## 2018-07-24 DIAGNOSIS — L578 Other skin changes due to chronic exposure to nonionizing radiation: Secondary | ICD-10-CM | POA: Diagnosis not present

## 2018-07-24 DIAGNOSIS — Z86018 Personal history of other benign neoplasm: Secondary | ICD-10-CM | POA: Diagnosis not present

## 2018-07-24 DIAGNOSIS — L853 Xerosis cutis: Secondary | ICD-10-CM | POA: Diagnosis not present

## 2018-07-24 DIAGNOSIS — Z85828 Personal history of other malignant neoplasm of skin: Secondary | ICD-10-CM | POA: Diagnosis not present

## 2018-07-24 DIAGNOSIS — Z872 Personal history of diseases of the skin and subcutaneous tissue: Secondary | ICD-10-CM | POA: Diagnosis not present

## 2018-07-24 DIAGNOSIS — L918 Other hypertrophic disorders of the skin: Secondary | ICD-10-CM | POA: Diagnosis not present

## 2018-08-22 ENCOUNTER — Other Ambulatory Visit: Payer: Self-pay | Admitting: Internal Medicine

## 2018-08-22 ENCOUNTER — Ambulatory Visit (INDEPENDENT_AMBULATORY_CARE_PROVIDER_SITE_OTHER): Payer: Medicare HMO | Admitting: Internal Medicine

## 2018-08-22 ENCOUNTER — Other Ambulatory Visit: Payer: Self-pay

## 2018-08-22 ENCOUNTER — Telehealth: Payer: Self-pay

## 2018-08-22 DIAGNOSIS — M5440 Lumbago with sciatica, unspecified side: Secondary | ICD-10-CM | POA: Diagnosis not present

## 2018-08-22 DIAGNOSIS — N3000 Acute cystitis without hematuria: Secondary | ICD-10-CM

## 2018-08-22 DIAGNOSIS — G8929 Other chronic pain: Secondary | ICD-10-CM | POA: Diagnosis not present

## 2018-08-22 MED ORDER — CIPROFLOXACIN HCL 500 MG PO TABS: 500.0000 mg | ORAL_TABLET | Freq: Two times a day (BID) | ORAL | 0 refills | Status: DC

## 2018-08-22 NOTE — Progress Notes (Signed)
Telephone Note  I connected with Cynthia Bryan  on 08/22/18 at  1:20 PM EDT by a telephone and verified that I am speaking with the correct person using two identifiers.  Location patient: home Location provider:work  Persons participating in the virtual visit: patient, provider  I discussed the limitations of evaluation and management by telemedicine and the availability of in person appointments. The patient expressed understanding and agreed to proceed.   HPI: 1. C/o mid back pain x 2 weeks worsening she she c/w UTI with h/o UTI and kidney stones she is having trouble walking pain 8/10 mid back and radiates down buttocks. She also does have h/o DDD/stenosis on MRI low back. No fever on dysuria, no CP, no sob. Pain is constant and effects ability to walk pain is mid back radiates side to side and buttocks. She has tried tramadol, gabapentin, robaxin and otc lidocaine patch, and PT which was helping.   She does not think this is kidney stone pain    ROS: See pertinent positives and negatives per HPI.  Past Medical History:  Diagnosis Date  . Arthritis   . Basal cell carcinoma    right temple Dr. Elmer Ramp removed 01/2017   . Chicken pox   . Emphysema of lung ([place])    h/o bronchitis not emphysema  . Fatty liver    09/19/15  . Gastric ulcer    neg H pylori   . GERD (gastroesophageal reflux disease)   . Gout   . Hiatal hernia   . Hyperlipidemia   . Hypertension   . Kidney stone    noted CT ab/pelvis 09/19/15 left kidney   . Kidney stone    Dr. Rogers Blocker 2019   . Lumbar herniated disc   . Recurrent UTI   . Syncope    per pt presyncope no LOC felt dizzy/vision distorted and couldnt speak EEG neg, CT head neg 07/31/16, f/u [facility]ology with US carotid, echo but no Holter   . Umbilical hernia    noted 09/19/15   . UTI (urinary tract infection)     Past Surgical History:  Procedure Laterality Date  . BREAST BIOPSY Right 1980   cyst removed negative pathology   .  EXTRACORPOREAL SHOCK WAVE LITHOTRIPSY Left 09/19/2017   Procedure: EXTRACORPOREAL SHOCK WAVE LITHOTRIPSY (ESWL);  Surgeon: Royston Cowper, MD;  Location: [facility] ORS;  Service: Urology;  Laterality: Left;  . JOINT REPLACEMENT     right knee 11/2015 Dr. Janell Qu[facility] [place]    Family History  Problem Relation Age of Onset  . Breast cancer Mother 37  . Cancer Mother        breast cancer s/p masectomy lived to be 80   . Hypertension Mother   . Cancer Father        NMSC skin cancer   . Gout Father   . Heart disease Father        s/p bypass surgery age 73 lived 5 months after surgery   . Kidney disease Father        RAS>kidney failure   . Hyperlipidemia Other   . Stroke Other   . Kidney cancer Neg Hx   . Bladder Cancer Neg Hx   . Prolactinoma Neg Hx     SOCIAL HX: lives alone   Current Outpatient Medications:  .  acetaminophen (TYLENOL) 500 MG tablet, Take 1,000 mg by mouth 2 (two) times daily. , Disp: , Rfl:  .  allopurinol (ZYLOPRIM) 300 MG tablet,  Take 1 tablet (300 mg total) by mouth daily., Disp: 90 tablet, Rfl: 3 .  atorvastatin (LIPITOR) 40 MG tablet, Take 1 tablet (40 mg total) by mouth daily at 6 PM., Disp: 90 tablet, Rfl: 3 .  calcium carbonate 1250 MG capsule, Take 1,250 mg by mouth 2 (two) times daily with a meal., Disp: , Rfl:  .  Cholecalciferol (VITAMIN D3) 5000 units CAPS, Take by mouth daily., Disp: , Rfl:  .  ciprofloxacin (CIPRO) 500 MG tablet, Take 1 tablet (500 mg total) by mouth 2 (two) times daily. With food, Disp: 10 tablet, Rfl: 0 .  CRANBERRY PO, Take 4,200 mg by mouth daily., Disp: , Rfl:  .  Cyanocobalamin (B-12) 5000 MCG CAPS, Take by mouth daily., Disp: , Rfl:  .  docusate sodium (COLACE) 100 MG capsule, Take 2 capsules (200 mg total) by mouth 2 (two) times daily., Disp: 120 capsule, Rfl: 3 .  estradiol (ESTRACE VAGINAL) 0.1 MG/GM vaginal cream, Apply 0.5mg  (pea-sized amount)  just inside the vaginal introitus with a finger-tip on Monday, Wednesday and  Friday nights., Disp: 30 g, Rfl: 12 .  gabapentin (NEURONTIN) 100 MG capsule, TAKE 1 CAPSULE (100 MG TOTAL) BY MOUTH AT BEDTIME., Disp: 90 capsule, Rfl: 1 .  Magnesium 500 MG CAPS, Take 1 tablet by mouth at bedtime., Disp: , Rfl:  .  methocarbamol (ROBAXIN) 750 MG tablet, Take 1 tablet (750 mg total) by mouth at bedtime as needed for muscle spasms., Disp: 30 tablet, Rfl: 5 .  pantoprazole (PROTONIX) 40 MG tablet, Take 1 tablet (40 mg total) by mouth daily., Disp: 90 tablet, Rfl: 3 .  potassium chloride (MICRO-K) 10 MEQ CR capsule, Take 1 capsule (10 mEq total) by mouth 2 (two) times daily., Disp: 180 capsule, Rfl: 3 .  traMADol (ULTRAM) 50 MG tablet, Take 1 tablet (50 mg total) by mouth 3 (three) times daily as needed., Disp: 90 tablet, Rfl: 2 .  triamterene-hydrochlorothiazide (MAXZIDE-25) 37.5-25 MG tablet, Take 1 tablet by mouth daily., Disp: 90 tablet, Rfl: 3  EXAM:  VITALS per patient if applicable:  GENERAL: alert, oriented, appears well and in no acute distress  HEENT: atraumatic, conjunttiva clear, no obvious abnormalities on inspection of external nose and ears  NECK: normal movements of the head and neck  LUNGS: on inspection no signs of respiratory distress, breathing rate appears normal, no obvious gross SOB, gasping or wheezing  CV: no obvious cyanosis  MS: moves all visible extremities without noticeable abnormality  PSYCH/NEURO: pleasant and cooperative, no obvious depression or anxiety, speech and thought processing grossly intact  ASSESSMENT AND PLAN:  Discussed the following assessment and plan:  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 Sciatica vs kidney stones vs UTI progressed to pyelo -tx at UTI cipro bid x 5 days UA and culture pending  If negative consider KUB and Xray mid back  She follows with Dr. Sharlet Salina and had f/u 3/20 chronic pain pain and cont chronic meds for chronic back pain   Acute cystitis  without hematuria - Plan: Urine Culture, Urinalysis, Routine w reflex microscopic, ciprofloxacin (CIPRO) 500 MG tablet     I discussed the assessment and treatment plan with the patient. The patient was provided an opportunity to ask questions and all were answered. The patient agreed with the plan and demonstrated an understanding of the instructions.   The patient was advised to call back or seek an in-person evaluation if the symptoms worsen or if  the condition fails to improve as anticipated.  Time spent 15 minutes  Delorise Jackson, MD

## 2018-08-22 NOTE — Telephone Encounter (Signed)
Have her drop off urine and we can do telephone visit at 1:15

## 2018-08-22 NOTE — Telephone Encounter (Signed)
Copied from [place] [id number]. Topic: Appointment Scheduling - Scheduling Inquiry for Clinic >> Aug 22, 2018  9:30 AM Celene Kras A wrote: Reason for CRM: Pt called stating she believes she has a uti. Pt is requesting to get an appt today. Pt states she would be okay with coming in to give a urine sample and just having tested if that is allowed. Please advise.

## 2018-08-24 LAB — URINALYSIS, ROUTINE W REFLEX MICROSCOPIC
Bilirubin Urine: NEGATIVE
Glucose, UA: NEGATIVE
Hgb urine dipstick: NEGATIVE
Ketones, ur: NEGATIVE
Leukocytes,Ua: NEGATIVE
Nitrite: NEGATIVE
Protein, ur: NEGATIVE
Specific Gravity, Urine: 1.022 (ref 1.001–1.03)
pH: 6.5 (ref 5.0–8.0)

## 2018-08-24 LAB — URINE CULTURE
MICRO NUMBER:: 588313
SPECIMEN QUALITY:: ADEQUATE

## 2018-08-27 ENCOUNTER — Ambulatory Visit (INDEPENDENT_AMBULATORY_CARE_PROVIDER_SITE_OTHER): Payer: Medicare HMO | Admitting: Internal Medicine

## 2018-08-27 ENCOUNTER — Other Ambulatory Visit: Payer: Self-pay

## 2018-08-27 DIAGNOSIS — Z0000 Encounter for general adult medical examination without abnormal findings: Secondary | ICD-10-CM

## 2018-08-27 DIAGNOSIS — M5416 Radiculopathy, lumbar region: Secondary | ICD-10-CM | POA: Diagnosis not present

## 2018-08-27 DIAGNOSIS — Z1329 Encounter for screening for other suspected endocrine disorder: Secondary | ICD-10-CM

## 2018-08-27 DIAGNOSIS — E785 Hyperlipidemia, unspecified: Secondary | ICD-10-CM

## 2018-08-27 DIAGNOSIS — M549 Dorsalgia, unspecified: Secondary | ICD-10-CM | POA: Diagnosis not present

## 2018-08-27 DIAGNOSIS — Z87442 Personal history of urinary calculi: Secondary | ICD-10-CM | POA: Diagnosis not present

## 2018-08-27 DIAGNOSIS — R7303 Prediabetes: Secondary | ICD-10-CM | POA: Diagnosis not present

## 2018-08-27 MED ORDER — OXYCODONE-ACETAMINOPHEN 5-325 MG PO TABS: 0.5000 | ORAL_TABLET | Freq: Two times a day (BID) | ORAL | 0 refills | Status: DC | PRN

## 2018-08-27 NOTE — Progress Notes (Signed)
Telephone Note  I connected with Cynthia Bryan   on 08/27/18 at  8:15 AM EDT by telephone and verified that I am speaking with the correct person using two identifiers.  Location patient: home Location provider:work  Persons participating in the virtual visit: patient, provider  I discussed the limitations of evaluation and management by telemedicine and the availability of in person appointments. The patient expressed understanding and agreed to proceed.   HPI: Still c/o mid back pain like a knot in he rback 8/10 grabbing sensation had 3 back injections with Dr. Sharlet Salina w/o relief and the 1st lasted for 2-3 weeks and 2nd and 3rd not effective with walking pain radiates from side to side of mid back and standing worse. She cant do ADLs or IADLs due to pain and pain is daily    ROS: See pertinent positives and negatives per HPI.  Past Medical History:  Diagnosis Date  . Arthritis   . Basal cell carcinoma    right temple Dr. Elmer Ramp removed 01/2017   . Chicken pox   . Emphysema of lung ([place])    h/o bronchitis not emphysema  . Fatty liver    09/19/15  . Gastric ulcer    neg H pylori   . GERD (gastroesophageal reflux disease)   . Gout   . Hiatal hernia   . Hyperlipidemia   . Hypertension   . Kidney stone    noted CT ab/pelvis 09/19/15 left kidney   . Kidney stone    Dr. Rogers Blocker 2019   . Lumbar herniated disc   . Recurrent UTI   . Syncope    per pt presyncope no LOC felt dizzy/vision distorted and couldnt speak EEG neg, CT head neg 07/31/16, f/u [facility]ology with US carotid, echo but no Holter   . Umbilical hernia    noted 09/19/15   . UTI (urinary tract infection)     Past Surgical History:  Procedure Laterality Date  . BREAST BIOPSY Right 1980   cyst removed negative pathology   . EXTRACORPOREAL SHOCK WAVE LITHOTRIPSY Left 09/19/2017   Procedure: EXTRACORPOREAL SHOCK WAVE LITHOTRIPSY (ESWL);  Surgeon: Royston Cowper, MD;  Location: [facility] ORS;  Service: Urology;   Laterality: Left;  . JOINT REPLACEMENT     right knee 11/2015 Dr. Janell Quiet [facility] [place]    Family History  Problem Relation Age of Onset  . Breast cancer Mother 55  . Cancer Mother        breast cancer s/p masectomy lived to be 55   . Hypertension Mother   . Cancer Father        NMSC skin cancer   . Gout Father   . Heart disease Father        s/p bypass surgery age 75 lived 5 months after surgery   . Kidney disease Father        RAS>kidney failure   . Hyperlipidemia Other   . Stroke Other   . Kidney cancer Neg Hx   . Bladder Cancer Neg Hx   . Prolactinoma Neg Hx     SOCIAL HX: lives alone    Current Outpatient Medications:  .  acetaminophen (TYLENOL) 500 MG tablet, Take 1,000 mg by mouth 2 (two) times daily. , Disp: , Rfl:  .  allopurinol (ZYLOPRIM) 300 MG tablet, Take 1 tablet (300 mg total) by mouth daily., Disp: 90 tablet, Rfl: 3 .  atorvastatin (LIPITOR) 40 MG tablet, Take 1 tablet (40 mg total) by mouth daily  at 6 PM., Disp: 90 tablet, Rfl: 3 .  calcium carbonate 1250 MG capsule, Take 1,250 mg by mouth 2 (two) times daily with a meal., Disp: , Rfl:  .  Cholecalciferol (VITAMIN D3) 5000 units CAPS, Take by mouth daily., Disp: , Rfl:  .  ciprofloxacin (CIPRO) 500 MG tablet, Take 1 tablet (500 mg total) by mouth 2 (two) times daily. With food, Disp: 10 tablet, Rfl: 0 .  CRANBERRY PO, Take 4,200 mg by mouth daily., Disp: , Rfl:  .  Cyanocobalamin (B-12) 5000 MCG CAPS, Take by mouth daily., Disp: , Rfl:  .  docusate sodium (COLACE) 100 MG capsule, Take 2 capsules (200 mg total) by mouth 2 (two) times daily., Disp: 120 capsule, Rfl: 3 .  estradiol (ESTRACE VAGINAL) 0.1 MG/GM vaginal cream, Apply 0.51m (pea-sized amount)  just inside the vaginal introitus with a finger-tip on Monday, Wednesday and Friday nights., Disp: 30 g, Rfl: 12 .  gabapentin (NEURONTIN) 100 MG capsule, TAKE 1 CAPSULE (100 MG TOTAL) BY MOUTH AT BEDTIME., Disp: 90 capsule, Rfl: 1 .  Magnesium 500 MG CAPS,  Take 1 tablet by mouth at bedtime., Disp: , Rfl:  .  methocarbamol (ROBAXIN) 750 MG tablet, Take 1 tablet (750 mg total) by mouth at bedtime as needed for muscle spasms., Disp: 30 tablet, Rfl: 5 .  oxyCODONE-acetaminophen (PERCOCET) 5-325 MG tablet, Take 0.5-1 tablets by mouth 2 (two) times daily as needed for severe pain., Disp: 10 tablet, Rfl: 0 .  pantoprazole (PROTONIX) 40 MG tablet, Take 1 tablet (40 mg total) by mouth daily., Disp: 90 tablet, Rfl: 3 .  potassium chloride (MICRO-K) 10 MEQ CR capsule, Take 1 capsule (10 mEq total) by mouth 2 (two) times daily., Disp: 180 capsule, Rfl: 3 .  traMADol (ULTRAM) 50 MG tablet, Take 1 tablet (50 mg total) by mouth 3 (three) times daily as needed., Disp: 90 tablet, Rfl: 2 .  triamterene-hydrochlorothiazide (MAXZIDE-25) 37.5-25 MG tablet, Take 1 tablet by mouth daily., Disp: 90 tablet, Rfl: 3  EXAM:  VITALS per patient if applicable:  GENERAL: alert, oriented, appears well and in no acute distress  PSYCH/NEURO: pleasant and cooperative, no obvious depression or anxiety, speech and thought processing grossly intact  ASSESSMENT AND PLAN:  Discussed the following assessment and plan:  Mid back pain - Plan: DG Thoracic Spine 2 View, Ambulatory referral to Neurosurgery Dr. 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 Was due 10/17/17, also had hep A 03/19/17 2nd dose due in 6-12 months h/o fatty liver MMR immune  Colonoscopy overdue disc reconsider  another in future   ? Date of last pap per pt no h/o abnormal ptper chart h/o abnormal papDr. FVickki Muffhas record. Last I can see 11/21/01 neg pap +BV   Mammogram9/11/19 2.5 cm cyst right breast f/u in 1 year will need to reorder in future  DEXA 04/03/12 osteopenia rec calcium 600 mg bid and on vit D3 5000 qd. Vit D normal on 5000 IU D3 qd  -consider  repeat DEXA ordered pending   Former smoker total 30 years max 1 ppd no FH lung cancer quit 20 years ago from 2019 Hep C neg 04/09/16  H/o skin cancer Dr. Brand Males to see again 06/2018   I discussed the assessment and treatment plan with the patient. The patient was provided an opportunity to ask questions and all were answered. The patient agreed with the plan and demonstrated an understanding of the instructions.   The patient was advised to call back or seek an in-person evaluation if the symptoms worsen or if the condition fails to improve as anticipated.  Time spent 15 minute  Delorise Jackson, MD

## 2018-08-28 ENCOUNTER — Ambulatory Visit
Admission: RE | Admit: 2018-08-28 | Discharge: 2018-08-28 | Disposition: A | Discharge status: Home / Self Care | Payer: Medicare HMO | Attending: Internal Medicine | Admitting: Internal Medicine

## 2018-08-28 ENCOUNTER — Telehealth: Payer: Self-pay | Admitting: Internal Medicine

## 2018-08-28 ENCOUNTER — Ambulatory Visit
Admission: RE | Admit: 2018-08-28 | Discharge: 2018-08-28 | Disposition: A | Discharge status: Home / Self Care | Payer: Medicare HMO | Source: Ambulatory Visit | Attending: Internal Medicine | Admitting: Internal Medicine

## 2018-08-28 ENCOUNTER — Other Ambulatory Visit: Payer: Self-pay

## 2018-08-28 DIAGNOSIS — M549 Dorsalgia, unspecified: Secondary | ICD-10-CM | POA: Insufficient documentation

## 2018-08-28 DIAGNOSIS — M47814 Spondylosis without myelopathy or radiculopathy, thoracic region: Secondary | ICD-10-CM | POA: Diagnosis not present

## 2018-08-28 DIAGNOSIS — Z87442 Personal history of urinary calculi: Secondary | ICD-10-CM

## 2018-08-28 DIAGNOSIS — N200 Calculus of kidney: Secondary | ICD-10-CM | POA: Diagnosis not present

## 2018-08-28 NOTE — Telephone Encounter (Signed)
X-ray called to have the thoracic 2 view changed to a thoracic 3 view they do not do a 2 view. Verbal given by PCP to nurse to ok change.

## 2018-09-09 ENCOUNTER — Other Ambulatory Visit: Payer: Self-pay | Admitting: Internal Medicine

## 2018-09-09 DIAGNOSIS — M5126 Other intervertebral disc displacement, lumbar region: Secondary | ICD-10-CM

## 2018-09-09 DIAGNOSIS — M5416 Radiculopathy, lumbar region: Secondary | ICD-10-CM

## 2018-09-09 MED ORDER — METHOCARBAMOL 750 MG PO TABS: 750.0000 mg | ORAL_TABLET | Freq: Every evening | ORAL | 5 refills | Status: DC | PRN

## 2018-09-16 ENCOUNTER — Other Ambulatory Visit: Payer: Self-pay | Admitting: Internal Medicine

## 2018-09-16 DIAGNOSIS — I10 Essential (primary) hypertension: Secondary | ICD-10-CM

## 2018-09-16 MED ORDER — TRIAMTERENE-HCTZ 37.5-25 MG PO TABS: 1.0000 | ORAL_TABLET | Freq: Every day | ORAL | 3 refills | Status: DC

## 2018-09-18 DIAGNOSIS — M5136 Other intervertebral disc degeneration, lumbar region: Secondary | ICD-10-CM | POA: Diagnosis not present

## 2018-10-08 DIAGNOSIS — R69 Illness, unspecified: Secondary | ICD-10-CM | POA: Diagnosis not present

## 2018-10-17 DIAGNOSIS — M47816 Spondylosis without myelopathy or radiculopathy, lumbar region: Secondary | ICD-10-CM | POA: Diagnosis not present

## 2018-10-17 DIAGNOSIS — M5136 Other intervertebral disc degeneration, lumbar region: Secondary | ICD-10-CM | POA: Diagnosis not present

## 2018-10-28 DIAGNOSIS — M47816 Spondylosis without myelopathy or radiculopathy, lumbar region: Secondary | ICD-10-CM | POA: Diagnosis not present

## 2018-11-17 DIAGNOSIS — R69 Illness, unspecified: Secondary | ICD-10-CM | POA: Diagnosis not present

## 2018-11-18 DIAGNOSIS — R69 Illness, unspecified: Secondary | ICD-10-CM | POA: Diagnosis not present

## 2018-11-21 ENCOUNTER — Other Ambulatory Visit: Payer: Self-pay | Admitting: Internal Medicine

## 2018-11-21 DIAGNOSIS — M25551 Pain in right hip: Secondary | ICD-10-CM

## 2018-11-21 DIAGNOSIS — M5126 Other intervertebral disc displacement, lumbar region: Secondary | ICD-10-CM

## 2018-11-21 DIAGNOSIS — R937 Abnormal findings on diagnostic imaging of other parts of musculoskeletal system: Secondary | ICD-10-CM

## 2018-11-21 DIAGNOSIS — M5416 Radiculopathy, lumbar region: Secondary | ICD-10-CM

## 2018-11-21 DIAGNOSIS — M25552 Pain in left hip: Secondary | ICD-10-CM

## 2018-11-21 MED ORDER — TRAMADOL HCL 50 MG PO TABS: 50.0000 mg | ORAL_TABLET | Freq: Three times a day (TID) | ORAL | 2 refills | Status: DC | PRN

## 2018-12-11 DIAGNOSIS — R69 Illness, unspecified: Secondary | ICD-10-CM | POA: Diagnosis not present

## 2018-12-16 DIAGNOSIS — M47816 Spondylosis without myelopathy or radiculopathy, lumbar region: Secondary | ICD-10-CM | POA: Diagnosis not present

## 2018-12-23 ENCOUNTER — Ambulatory Visit: Payer: Medicare HMO | Admitting: Internal Medicine

## 2018-12-26 ENCOUNTER — Other Ambulatory Visit: Payer: Self-pay

## 2018-12-26 ENCOUNTER — Other Ambulatory Visit (INDEPENDENT_AMBULATORY_CARE_PROVIDER_SITE_OTHER): Payer: Medicare HMO

## 2018-12-26 DIAGNOSIS — Z1329 Encounter for screening for other suspected endocrine disorder: Secondary | ICD-10-CM

## 2018-12-26 DIAGNOSIS — E785 Hyperlipidemia, unspecified: Secondary | ICD-10-CM

## 2018-12-26 DIAGNOSIS — Z0000 Encounter for general adult medical examination without abnormal findings: Secondary | ICD-10-CM | POA: Diagnosis not present

## 2018-12-26 DIAGNOSIS — R7303 Prediabetes: Secondary | ICD-10-CM

## 2018-12-26 LAB — CBC WITH DIFFERENTIAL/PLATELET
Basophils Absolute: 0 10*3/uL (ref 0.0–0.1)
Basophils Relative: 0.7 % (ref 0.0–3.0)
Eosinophils Absolute: 0.2 10*3/uL (ref 0.0–0.7)
Eosinophils Relative: 3.3 % (ref 0.0–5.0)
HCT: 42.8 % (ref 36.0–46.0)
Hemoglobin: 13.9 g/dL (ref 12.0–15.0)
Lymphocytes Relative: 35.3 % (ref 12.0–46.0)
Lymphs Abs: 2.3 10*3/uL (ref 0.7–4.0)
MCHC: 32.4 g/dL (ref 30.0–36.0)
MCV: 95.9 fl (ref 78.0–100.0)
Monocytes Absolute: 0.4 10*3/uL (ref 0.1–1.0)
Monocytes Relative: 5.9 % (ref 3.0–12.0)
Neutro Abs: 3.5 10*3/uL (ref 1.4–7.7)
Neutrophils Relative %: 54.8 % (ref 43.0–77.0)
Platelets: 219 10*3/uL (ref 150.0–400.0)
RBC: 4.46 Mil/uL (ref 3.87–5.11)
RDW: 16.6 % — ABNORMAL HIGH (ref 11.5–15.5)
WBC: 6.5 10*3/uL (ref 4.0–10.5)

## 2018-12-26 LAB — COMPREHENSIVE METABOLIC PANEL WITH GFR
ALT: 31 U/L (ref 0–35)
AST: 23 U/L (ref 0–37)
Albumin: 4.4 g/dL (ref 3.5–5.2)
Alkaline Phosphatase: 99 U/L (ref 39–117)
BUN: 19 mg/dL (ref 6–23)
CO2: 29 meq/L (ref 19–32)
Calcium: 9.9 mg/dL (ref 8.4–10.5)
Chloride: 99 meq/L (ref 96–112)
Creatinine, Ser: 0.88 mg/dL (ref 0.40–1.20)
GFR: 63.3 mL/min
Glucose, Bld: 104 mg/dL — ABNORMAL HIGH (ref 70–99)
Potassium: 3.4 meq/L — ABNORMAL LOW (ref 3.5–5.1)
Sodium: 140 meq/L (ref 135–145)
Total Bilirubin: 0.8 mg/dL (ref 0.2–1.2)
Total Protein: 7.2 g/dL (ref 6.0–8.3)

## 2018-12-26 LAB — LDL CHOLESTEROL, DIRECT: Direct LDL: 74 mg/dL

## 2018-12-26 LAB — LIPID PANEL
Cholesterol: 172 mg/dL (ref 0–200)
HDL: 51.1 mg/dL
NonHDL: 120.84
Total CHOL/HDL Ratio: 3
Triglycerides: 253 mg/dL — ABNORMAL HIGH (ref 0.0–149.0)
VLDL: 50.6 mg/dL — ABNORMAL HIGH (ref 0.0–40.0)

## 2018-12-26 LAB — HEMOGLOBIN A1C: Hgb A1c MFr Bld: 6.2 % (ref 4.6–6.5)

## 2018-12-26 LAB — TSH: TSH: 2.44 u[IU]/mL (ref 0.35–4.50)

## 2018-12-31 ENCOUNTER — Other Ambulatory Visit: Payer: Self-pay

## 2019-01-02 ENCOUNTER — Other Ambulatory Visit: Payer: Self-pay

## 2019-01-02 ENCOUNTER — Encounter: Payer: Self-pay | Admitting: Internal Medicine

## 2019-01-02 ENCOUNTER — Ambulatory Visit (INDEPENDENT_AMBULATORY_CARE_PROVIDER_SITE_OTHER): Payer: Medicare HMO | Admitting: Internal Medicine

## 2019-01-02 VITALS — BP 150/92 | HR 98 | Temp 98.1°F | Ht 65.0 in | Wt 227.2 lb

## 2019-01-02 DIAGNOSIS — M545 Low back pain, unspecified: Secondary | ICD-10-CM

## 2019-01-02 DIAGNOSIS — M109 Gout, unspecified: Secondary | ICD-10-CM | POA: Diagnosis not present

## 2019-01-02 DIAGNOSIS — G8929 Other chronic pain: Secondary | ICD-10-CM

## 2019-01-02 DIAGNOSIS — I10 Essential (primary) hypertension: Secondary | ICD-10-CM | POA: Diagnosis not present

## 2019-01-02 DIAGNOSIS — R062 Wheezing: Secondary | ICD-10-CM | POA: Diagnosis not present

## 2019-01-02 DIAGNOSIS — E876 Hypokalemia: Secondary | ICD-10-CM | POA: Diagnosis not present

## 2019-01-02 DIAGNOSIS — E785 Hyperlipidemia, unspecified: Secondary | ICD-10-CM

## 2019-01-02 DIAGNOSIS — K219 Gastro-esophageal reflux disease without esophagitis: Secondary | ICD-10-CM | POA: Diagnosis not present

## 2019-01-02 MED ORDER — PANTOPRAZOLE SODIUM 40 MG PO TBEC: 40.0000 mg | DELAYED_RELEASE_TABLET | Freq: Every day | ORAL | 3 refills | Status: DC

## 2019-01-02 MED ORDER — AMLODIPINE BESYLATE 2.5 MG PO TABS: 2.5000 mg | ORAL_TABLET | Freq: Every day | ORAL | 11 refills | Status: DC

## 2019-01-02 MED ORDER — ALLOPURINOL 300 MG PO TABS: 300.0000 mg | ORAL_TABLET | Freq: Every day | ORAL | 3 refills | Status: DC

## 2019-01-02 MED ORDER — ATORVASTATIN CALCIUM 40 MG PO TABS: 40.0000 mg | ORAL_TABLET | Freq: Every day | ORAL | 3 refills | Status: DC

## 2019-01-02 MED ORDER — POTASSIUM CHLORIDE ER 10 MEQ PO CPCR: 10.0000 meq | ORAL_CAPSULE | Freq: Two times a day (BID) | ORAL | 3 refills | Status: DC

## 2019-01-02 NOTE — Patient Instructions (Addendum)
Goal blood pressure 130-140/<80   Amlodipine tablets What is this medicine? AMLODIPINE (am LOE di peen) is a calcium-channel blocker. It affects the amount of calcium found in your heart and muscle cells. This relaxes your blood vessels, which can reduce the amount of work the heart has to do. This medicine is used to lower high blood pressure. It is also used to prevent chest pain. This medicine may be used for other purposes; ask your health care provider or pharmacist if you have questions. COMMON BRAND NAME(S): Norvasc What should I tell my health care provider before I take this medicine? They need to know if you have any of these conditions:  heart disease  liver disease  an unusual or allergic reaction to amlodipine, other medicines, foods, dyes, or preservatives  pregnant or trying to get pregnant  breast-feeding How should I use this medicine? Take this medicine by mouth with a glass of water. Follow the directions on the prescription label. You can take it with or without food. If it upsets your stomach, take it with food. Take your medicine at regular intervals. Do not take it more often than directed. Do not stop taking except on your doctor's advice. Talk to your pediatrician regarding the use of this medicine in children. While this drug may be prescribed for children as young as 6 years for selected conditions, precautions do apply. Patients over 97 years of age may have a stronger reaction and need a smaller dose. Overdosage: If you think you have taken too much of this medicine contact a poison control center or emergency room at once. NOTE: This medicine is only for you. Do not share this medicine with others. What if I miss a dose? If you miss a dose, take it as soon as you can. If it is almost time for your next dose, take only that dose. Do not take double or extra doses. What may interact with this medicine? Do not take this medicine with any of the following  medications:  tranylcypromine This medicine may also interact with the following medications:  clarithromycin  cyclosporine  diltiazem  itraconazole  simvastatin  tacrolimus This list may not describe all possible interactions. Give your health care provider a list of all the medicines, herbs, non-prescription drugs, or dietary supplements you use. Also tell them if you smoke, drink alcohol, or use illegal drugs. Some items may interact with your medicine. What should I watch for while using this medicine? Visit your healthcare professional for regular checks on your progress. Check your blood pressure as directed. Ask your healthcare professional what your blood pressure should be and when you should contact him or her. Do not treat yourself for coughs, colds, or pain while you are using this medicine without asking your healthcare professional for advice. Some medicines may increase your blood pressure. You may get dizzy. Do not drive, use machinery, or do anything that needs mental alertness until you know how this medicine affects you. Do not stand or sit up quickly, especially if you are an older patient. This reduces the risk of dizzy or fainting spells. Avoid alcoholic drinks; they can make you dizzier. What side effects may I notice from receiving this medicine? Side effects that you should report to your doctor or health care professional as soon as possible:  allergic reactions like skin rash, itching or hives; swelling of the face, lips, or tongue  fast, irregular heartbeat  signs and symptoms of low blood pressure like dizziness; feeling  faint or lightheaded, falls; unusually weak or tired  swelling of ankles, feet, hands Side effects that usually do not require medical attention (report these to your doctor or health care professional if they continue or are bothersome):  dry mouth  facial flushing  headache  stomach pain  tiredness This list may not describe  all possible side effects. Call your doctor for medical advice about side effects. You may report side effects to FDA at 1-800-FDA-1088. Where should I keep my medicine? Keep out of the reach of children. Store at room temperature between 59 and 86 degrees F (15 and 30 degrees C). Throw away any unused medicine after the expiration date. NOTE: This sheet is a summary. It may not cover all possible information. If you have questions about this medicine, talk to your doctor, pharmacist, or health care provider.  2020 Elsevier/Gold Standard (2017-09-13 15:07:10)  DASH Eating Plan DASH stands for "Dietary Approaches to Stop Hypertension." The DASH eating plan is a healthy eating plan that has been shown to reduce high blood pressure (hypertension). It may also reduce your risk for type 2 diabetes, heart disease, and stroke. The DASH eating plan may also help with weight loss. What are tips for following this plan?  General guidelines  Avoid eating more than 2,300 mg (milligrams) of salt (sodium) a day. If you have hypertension, you may need to reduce your sodium intake to 1,500 mg a day.  Limit alcohol intake to no more than 1 drink a day for nonpregnant women and 2 drinks a day for men. One drink equals 12 oz of beer, 5 oz of wine, or 1 oz of hard liquor.  Work with your health care provider to maintain a healthy body weight or to lose weight. Ask what an ideal weight is for you.  Get at least 30 minutes of exercise that causes your heart to beat faster (aerobic exercise) most days of the week. Activities may include walking, swimming, or biking.  Work with your health care provider or diet and nutrition specialist (dietitian) to adjust your eating plan to your individual calorie needs. Reading food labels   Check food labels for the amount of sodium per serving. Choose foods with less than 5 percent of the Daily Value of sodium. Generally, foods with less than 300 mg of sodium per serving  fit into this eating plan.  To find whole grains, look for the word "whole" as the first word in the ingredient list. Shopping  Buy products labeled as "low-sodium" or "no salt added."  Buy fresh foods. Avoid canned foods and premade or frozen meals. Cooking  Avoid adding salt when cooking. Use salt-free seasonings or herbs instead of table salt or sea salt. Check with your health care provider or pharmacist before using salt substitutes.  Do not fry foods. Cook foods using healthy methods such as baking, boiling, grilling, and broiling instead.  Cook with heart-healthy oils, such as olive, canola, soybean, or sunflower oil. Meal planning  Eat a balanced diet that includes: ? 5 or more servings of fruits and vegetables each day. At each meal, try to fill half of your plate with fruits and vegetables. ? Up to 6-8 servings of whole grains each day. ? Less than 6 oz of lean meat, poultry, or fish each day. A 3-oz serving of meat is about the same size as a deck of cards. One egg equals 1 oz. ? 2 servings of low-fat dairy each day. ? A serving of nuts,  seeds, or beans 5 times each week. ? Heart-healthy fats. Healthy fats called Omega-3 fatty acids are found in foods such as flaxseeds and coldwater fish, like sardines, salmon, and mackerel.  Limit how much you eat of the following: ? Canned or prepackaged foods. ? Food that is high in trans fat, such as fried foods. ? Food that is high in saturated fat, such as fatty meat. ? Sweets, desserts, sugary drinks, and other foods with added sugar. ? Full-fat dairy products.  Do not salt foods before eating.  Try to eat at least 2 vegetarian meals each week.  Eat more home-cooked food and less restaurant, buffet, and fast food.  When eating at a restaurant, ask that your food be prepared with less salt or no salt, if possible. What foods are recommended? The items listed may not be a complete list. Talk with your dietitian about what  dietary choices are best for you. Grains Whole-grain or whole-wheat bread. Whole-grain or whole-wheat pasta. Brown rice. [place]. Bulgur. Whole-grain and low-sodium cereals. Pita bread. Low-fat, low-sodium crackers. Whole-wheat flour tortillas. Vegetables Fresh or frozen vegetables (raw, steamed, roasted, or grilled). Low-sodium or reduced-sodium tomato and vegetable juice. Low-sodium or reduced-sodium tomato sauce and tomato paste. Low-sodium or reduced-sodium canned vegetables. Fruits All fresh, dried, or frozen fruit. Canned fruit in natural juice (without added sugar). Meat and other protein foods Skinless chicken or [place]. Ground chicken or [place]. Pork with fat trimmed off. Fish and seafood. Egg whites. Dried beans, peas, or lentils. Unsalted nuts, nut butters, and seeds. Unsalted canned beans. Lean cuts of beef with fat trimmed off. Low-sodium, lean deli meat. Dairy Low-fat (1%) or fat-free (skim) milk. Fat-free, low-fat, or reduced-fat cheeses. Nonfat, low-sodium ricotta or cottage cheese. Low-fat or nonfat yogurt. Low-fat, low-sodium cheese. Fats and oils Soft margarine without trans fats. Vegetable oil. Low-fat, reduced-fat, or light mayonnaise and salad dressings (reduced-sodium). Canola, safflower, olive, soybean, and sunflower oils. Avocado. Seasoning and other foods Herbs. Spices. Seasoning mixes without salt. Unsalted popcorn and pretzels. Fat-free sweets. What foods are not recommended? The items listed may not be a complete list. Talk with your dietitian about what dietary choices are best for you. Grains Baked goods made with fat, such as croissants, muffins, or some breads. Dry pasta or rice meal packs. Vegetables Creamed or fried vegetables. Vegetables in a cheese sauce. Regular canned vegetables (not low-sodium or reduced-sodium). Regular canned tomato sauce and paste (not low-sodium or reduced-sodium). Regular tomato and vegetable juice (not low-sodium or  reduced-sodium). Angie Fava. Olives. Fruits Canned fruit in a light or heavy syrup. Fried fruit. Fruit in cream or butter sauce. Meat and other protein foods Fatty cuts of meat. Ribs. Fried meat. Berniece Salines. Sausage. Bologna and other processed lunch meats. Salami. Fatback. Hotdogs. Bratwurst. Salted nuts and seeds. Canned beans with added salt. Canned or smoked fish. Whole eggs or egg yolks. Chicken or [place] with skin. Dairy Whole or 2% milk, cream, and half-and-half. Whole or full-fat cream cheese. Whole-fat or sweetened yogurt. Full-fat cheese. Nondairy creamers. Whipped toppings. Processed cheese and cheese spreads. Fats and oils Butter. Stick margarine. Lard. Shortening. Ghee. Bacon fat. Tropical oils, such as coconut, palm kernel, or palm oil. Seasoning and other foods Salted popcorn and pretzels. Onion salt, garlic salt, seasoned salt, table salt, and sea salt. Worcestershire sauce. Tartar sauce. Barbecue sauce. Teriyaki sauce. Soy sauce, including reduced-sodium. Steak sauce. Canned and packaged gravies. Fish sauce. Oyster sauce. Cocktail sauce. Horseradish that you find on the shelf. Ketchup. Mustard. Meat flavorings and  tenderizers. Bouillon cubes. Hot sauce and Tabasco sauce. Premade or packaged marinades. Premade or packaged taco seasonings. Relishes. Regular salad dressings. Where to find more information:  National Heart, Lung, and [place]: https://wilson-eaton.com/  American Heart Association: www.heart.org Summary  The DASH eating plan is a healthy eating plan that has been shown to reduce high blood pressure (hypertension). It may also reduce your risk for type 2 diabetes, heart disease, and stroke.  With the DASH eating plan, you should limit salt (sodium) intake to 2,300 mg a day. If you have hypertension, you may need to reduce your sodium intake to 1,500 mg a day.  When on the DASH eating plan, aim to eat more fresh fruits and vegetables, whole grains, lean proteins, low-fat  dairy, and heart-healthy fats.  Work with your health care provider or diet and nutrition specialist (dietitian) to adjust your eating plan to your individual calorie needs. This information is not intended to replace advice given to you by your health care provider. Make sure you discuss any questions you have with your health care provider. Document Released: 02/08/2011 Document Revised: 02/01/2017 Document Reviewed: 02/13/2016 Elsevier Patient Education  2020 [employer].  Hypertension, Adult High blood pressure (hypertension) is when the force of blood pumping through the arteries is too strong. The arteries are the blood vessels that carry blood from the heart throughout the body. Hypertension forces the heart to work harder to pump blood and may cause arteries to become narrow or stiff. Untreated or uncontrolled hypertension can cause a heart attack, heart failure, a stroke, kidney disease, and other problems. A blood pressure reading consists of a higher number over a lower number. Ideally, your blood pressure should be below 120/80. The first ("top") number is called the systolic pressure. It is a measure of the pressure in your arteries as your heart beats. The second ("bottom") number is called the diastolic pressure. It is a measure of the pressure in your arteries as the heart relaxes. What are the causes? The exact cause of this condition is not known. There are some conditions that result in or are related to high blood pressure. What increases the risk? Some risk factors for high blood pressure are under your control. The following factors may make you more likely to develop this condition:  Smoking.  Having type 2 diabetes mellitus, high cholesterol, or both.  Not getting enough exercise or physical activity.  Being overweight.  Having too much fat, sugar, calories, or salt (sodium) in your diet.  Drinking too much alcohol. Some risk factors for high blood pressure may be  difficult or impossible to change. Some of these factors include:  Having chronic kidney disease.  Having a family history of high blood pressure.  Age. Risk increases with age.  Race. You may be at higher risk if you are African American.  Gender. Men are at higher risk than women before age 67. After age 25, women are at higher risk than men.  Having obstructive sleep apnea.  Stress. What are the signs or symptoms? High blood pressure may not cause symptoms. Very high blood pressure (hypertensive crisis) may cause:  Headache.  Anxiety.  Shortness of breath.  Nosebleed.  Nausea and vomiting.  Vision changes.  Severe chest pain.  Seizures. How is this diagnosed? This condition is diagnosed by measuring your blood pressure while you are seated, with your arm resting on a flat surface, your legs uncrossed, and your feet flat on the floor. The cuff of the  blood pressure monitor will be placed directly against the skin of your upper arm at the level of your heart. It should be measured at least twice using the same arm. Certain conditions can cause a difference in blood pressure between your right and left arms. Certain factors can cause blood pressure readings to be lower or higher than normal for a short period of time:  When your blood pressure is higher when you are in a health care provider's office than when you are at home, this is called white coat hypertension. Most people with this condition do not need medicines.  When your blood pressure is higher at home than when you are in a health care provider's office, this is called masked hypertension. Most people with this condition may need medicines to control blood pressure. If you have a high blood pressure reading during one visit or you have normal blood pressure with other risk factors, you may be asked to:  Return on a different day to have your blood pressure checked again.  Monitor your blood pressure at home for  1 week or longer. If you are diagnosed with hypertension, you may have other blood or imaging tests to help your health care provider understand your overall risk for other conditions. How is this treated? This condition is treated by making healthy lifestyle changes, such as eating healthy foods, exercising more, and reducing your alcohol intake. Your health care provider may prescribe medicine if lifestyle changes are not enough to get your blood pressure under control, and if:  Your systolic blood pressure is above 130.  Your diastolic blood pressure is above 80. Your personal target blood pressure may vary depending on your medical conditions, your age, and other factors. Follow these instructions at home: Eating and drinking   Eat a diet that is high in fiber and potassium, and low in sodium, added sugar, and fat. An example eating plan is called the DASH (Dietary Approaches to Stop Hypertension) diet. To eat this way: ? Eat plenty of fresh fruits and vegetables. Try to fill one half of your plate at each meal with fruits and vegetables. ? Eat whole grains, such as whole-wheat pasta, brown rice, or whole-grain bread. Fill about one fourth of your plate with whole grains. ? Eat or drink low-fat dairy products, such as skim milk or low-fat yogurt. ? Avoid fatty cuts of meat, processed or cured meats, and poultry with skin. Fill about one fourth of your plate with lean proteins, such as fish, chicken without skin, beans, eggs, or tofu. ? Avoid pre-made and processed foods. These tend to be higher in sodium, added sugar, and fat.  Reduce your daily sodium intake. Most people with hypertension should eat less than 1,500 mg of sodium a day.  Do not drink alcohol if: ? Your health care provider tells you not to drink. ? You are pregnant, may be pregnant, or are planning to become pregnant.  If you drink alcohol: ? Limit how much you use to:  0-1 drink a day for women.  0-2 drinks a day  for men. ? Be aware of how much alcohol is in your drink. In the [place] one drink equals one 12 oz bottle of beer (355 mL), one 5 oz glass of wine (148 mL), or one 1 oz glass of hard liquor (44 mL). Lifestyle   Work with your health care provider to maintain a healthy body weight or to lose weight. Ask what an ideal weight is for  you.  Get at least 30 minutes of exercise most days of the week. Activities may include walking, swimming, or biking.  Include exercise to strengthen your muscles (resistance exercise), such as Pilates or lifting weights, as part of your weekly exercise routine. Try to do these types of exercises for 30 minutes at least 3 days a week.  Do not use any products that contain nicotine or tobacco, such as cigarettes, e-cigarettes, and chewing tobacco. If you need help quitting, ask your health care provider.  Monitor your blood pressure at home as told by your health care provider.  Keep all follow-up visits as told by your health care provider. This is important. Medicines  Take over-the-counter and prescription medicines only as told by your health care provider. Follow directions carefully. Blood pressure medicines must be taken as prescribed.  Do not skip doses of blood pressure medicine. Doing this puts you at risk for problems and can make the medicine less effective.  Ask your health care provider about side effects or reactions to medicines that you should watch for. Contact a health care provider if you:  Think you are having a reaction to a medicine you are taking.  Have headaches that keep coming back (recurring).  Feel dizzy.  Have swelling in your ankles.  Have trouble with your vision. Get help right away if you:  Develop a severe headache or confusion.  Have unusual weakness or numbness.  Feel faint.  Have severe pain in your chest or abdomen.  Vomit repeatedly.  Have trouble breathing. Summary  Hypertension is when the force of  blood pumping through your arteries is too strong. If this condition is not controlled, it may put you at risk for serious complications.  Your personal target blood pressure may vary depending on your medical conditions, your age, and other factors. For most people, a normal blood pressure is less than 120/80.  Hypertension is treated with lifestyle changes, medicines, or a combination of both. Lifestyle changes include losing weight, eating a healthy, low-sodium diet, exercising more, and limiting alcohol. This information is not intended to replace advice given to you by your health care provider. Make sure you discuss any questions you have with your health care provider. Document Released: 02/19/2005 Document Revised: 10/30/2017 Document Reviewed: 10/30/2017 Elsevier Patient Education  2020 [employer].

## 2019-01-02 NOTE — Progress Notes (Signed)
Chief Complaint  Patient presents with  . Follow-up   F/u  1. HTN elevated on maxzide 37.5-25 elevated sbp 150s-170s 2. Chronic back pain pending radiofreq ablation x 2 nerves in [place] 11/10 and 11/17 left and right respectively limiting mobility and walking on tramadol tid prn and f/u NS   3. H/o UTI/kidney stones no sx's    Review of Systems  Constitutional: Negative for weight loss.  HENT: Negative for hearing loss.   Eyes: Negative for blurred vision.  Respiratory: Negative for wheezing.   Cardiovascular: Negative for chest pain.  Gastrointestinal: Negative for abdominal pain.  Genitourinary: Negative for dysuria.  Musculoskeletal: Positive for back pain.  Skin: Negative for rash.  Neurological: Negative for headaches.  Psychiatric/Behavioral: Negative for depression.   Past Medical History:  Diagnosis Date  . Arthritis   . Basal cell carcinoma    right temple Dr. Elmer Ramp removed 01/2017   . Chicken pox   . Emphysema of lung ([place])    h/o bronchitis not emphysema  . Fatty liver    09/19/15  . Gastric ulcer    neg H pylori   . GERD (gastroesophageal reflux disease)   . Gout   . Hiatal hernia   . Hyperlipidemia   . Hypertension   . Kidney stone    noted CT ab/pelvis 09/19/15 left kidney   . Kidney stone    Dr. Rogers Blocker 2019   . Lumbar herniated disc   . Recurrent UTI   . Syncope    per pt presyncope no LOC felt dizzy/vision distorted and couldnt speak EEG neg, CT head neg 07/31/16, f/u [facility]ology with US carotid, echo but no Holter   . Umbilical hernia    noted 09/19/15   . UTI (urinary tract infection)    Past Surgical History:  Procedure Laterality Date  . BREAST BIOPSY Right 1980   cyst removed negative pathology   . EXTRACORPOREAL SHOCK WAVE LITHOTRIPSY Left 09/19/2017   Procedure: EXTRACORPOREAL SHOCK WAVE LITHOTRIPSY (ESWL);  Surgeon: Royston Cowper, MD;  Location: [facility] ORS;  Service: Urology;  Laterality: Left;  . JOINT REPLACEMENT     right knee  11/2015 Dr. Janell Quiet [facility] [place]   Family History  Problem Relation Age of Onset  . Breast cancer Mother 18  . Cancer Mother        breast cancer s/p masectomy lived to be 60   . Hypertension Mother   . Cancer Father        NMSC skin cancer   . Gout Father   . Heart disease Father        s/p bypass surgery age 5 lived 5 months after surgery   . Kidney disease Father        RAS>kidney failure   . Hyperlipidemia Other   . Stroke Other   . Kidney cancer Neg Hx   . Bladder Cancer Neg Hx   . Prolactinoma Neg Hx    Social History   Socioeconomic History  . Marital status: Widowed    Spouse name: Not on file  . Number of children: Not on file  . Years of education: Not on file  . Highest education level: Not on file  Occupational History  . Not on file  Social Needs  . Financial resource strain: Not hard at all  . Food insecurity    Worry: Never true    Inability: Never true  . Transportation needs    Medical: No    Non-medical:  No  Tobacco Use  . Smoking status: Former Research scientist (life sciences)  . Smokeless tobacco: Never Used  . Tobacco comment: smoked total 30 years quit 20 years ago from 2019 max 1 ppd no FH lung cancer   Substance and Sexual Activity  . Alcohol use: No  . Drug use: Never  . Sexual activity: Not on file  Lifestyle  . Physical activity    Days per week: 0 days    Minutes per session: Not on file  . Stress: Not at all  Relationships  . Social Herbalist on phone: Not on file    Gets together: Not on file    Attends religious service: Not on file    Active member of club or organization: Not on file    Attends meetings of clubs or organizations: Not on file    Relationship status: Not on file  . Intimate partner violence    Fear of current or ex partner: Not on file    Emotionally abused: Not on file    Physically abused: Not on file    Forced sexual activity: Not on file  Other Topics Concern  . Not on file  Social History Narrative   2 years  college    Retired Customer service manager    Current Meds  Medication Sig  . acetaminophen (TYLENOL) 500 MG tablet Take 1,000 mg by mouth 2 (two) times daily.   Marland Kitchen allopurinol (ZYLOPRIM) 300 MG tablet Take 1 tablet (300 mg total) by mouth daily.  Marland Kitchen atorvastatin (LIPITOR) 40 MG tablet Take 1 tablet (40 mg total) by mouth daily at 6 PM.  . calcium carbonate 1250 MG capsule Take 1,250 mg by mouth 2 (two) times daily with a meal.  . Cholecalciferol (VITAMIN D3) 5000 units CAPS Take by mouth daily.  Marland Kitchen CRANBERRY PO Take 4,200 mg by mouth daily.  . Cyanocobalamin (B-12) 5000 MCG CAPS Take by mouth daily.  Marland Kitchen gabapentin (NEURONTIN) 100 MG capsule TAKE 1 CAPSULE (100 MG TOTAL) BY MOUTH AT BEDTIME.  . Magnesium 500 MG CAPS Take 1 tablet by mouth at bedtime.  . methocarbamol (ROBAXIN) 750 MG tablet Take 1 tablet (750 mg total) by mouth at bedtime as needed for muscle spasms.  . pantoprazole (PROTONIX) 40 MG tablet Take 1 tablet (40 mg total) by mouth daily.  . potassium chloride (MICRO-K) 10 MEQ CR capsule Take 1 capsule (10 mEq total) by mouth 2 (two) times daily.  . traMADol (ULTRAM) 50 MG tablet Take 1 tablet (50 mg total) by mouth 3 (three) times daily as needed.  . triamterene-hydrochlorothiazide (MAXZIDE-25) 37.5-25 MG tablet Take 1 tablet by mouth daily.  . [DISCONTINUED] allopurinol (ZYLOPRIM) 300 MG tablet Take 1 tablet (300 mg total) by mouth daily.  . [DISCONTINUED] atorvastatin (LIPITOR) 40 MG tablet Take 1 tablet (40 mg total) by mouth daily at 6 PM.  . [DISCONTINUED] pantoprazole (PROTONIX) 40 MG tablet Take 1 tablet (40 mg total) by mouth daily.  . [DISCONTINUED] potassium chloride (MICRO-K) 10 MEQ CR capsule Take 1 capsule (10 mEq total) by mouth 2 (two) times daily.   Allergies  Allergen Reactions  . Nortriptyline Nausea Only  . Sulfa Antibiotics Rash   Recent Results (from the past 2160 hour(s))  TSH     Status: None   Collection Time: 12/26/18  8:26 AM  Result Value Ref Range   TSH 2.44 0.35 -  4.50 uIU/mL  Hemoglobin A1c     Status: None   Collection Time: 12/26/18  8:26 AM  Result Value Ref Range   Hgb A1c MFr Bld 6.2 4.6 - 6.5 %    Comment: Glycemic Control Guidelines for People with Diabetes:Non Diabetic:  <6%Goal of Therapy: <7%Additional Action Suggested:  >8%   Lipid panel     Status: Abnormal   Collection Time: 12/26/18  8:26 AM  Result Value Ref Range   Cholesterol 172 0 - 200 mg/dL    Comment: ATP III Classification       Desirable:  < 200 mg/dL               Borderline High:  200 - 239 mg/dL          High:  > = 240 mg/dL   Triglycerides 253.0 (H) 0.0 - 149.0 mg/dL    Comment: Normal:  <150 mg/dLBorderline High:  150 - 199 mg/dL   HDL 51.10 >39.00 mg/dL   VLDL 50.6 (H) 0.0 - 40.0 mg/dL   Total CHOL/HDL Ratio 3     Comment:                Men          Women1/2 Average Risk     3.4          3.3Average Risk          5.0          4.42X Average Risk          9.6          7.13X Average Risk          15.0          11.0                       NonHDL 120.84     Comment: NOTE:  Non-HDL goal should be 30 mg/dL higher than patient's LDL goal (i.e. LDL goal of < 70 mg/dL, would have non-HDL goal of < 100 mg/dL)  CBC with Differential/Platelet     Status: Abnormal   Collection Time: 12/26/18  8:26 AM  Result Value Ref Range   WBC 6.5 4.0 - 10.5 K/uL   RBC 4.46 3.87 - 5.11 Mil/uL   Hemoglobin 13.9 12.0 - 15.0 g/dL   HCT 42.8 36.0 - 46.0 %   MCV 95.9 78.0 - 100.0 fl   MCHC 32.4 30.0 - 36.0 g/dL   RDW 16.6 (H) 11.5 - 15.5 %   Platelets 219.0 150.0 - 400.0 K/uL   Neutrophils Relative % 54.8 43.0 - 77.0 %   Lymphocytes Relative 35.3 12.0 - 46.0 %   Monocytes Relative 5.9 3.0 - 12.0 %   Eosinophils Relative 3.3 0.0 - 5.0 %   Basophils Relative 0.7 0.0 - 3.0 %   Neutro Abs 3.5 1.4 - 7.7 K/uL   Lymphs Abs 2.3 0.7 - 4.0 K/uL   Monocytes Absolute 0.4 0.1 - 1.0 K/uL   Eosinophils Absolute 0.2 0.0 - 0.7 K/uL   Basophils Absolute 0.0 0.0 - 0.1 K/uL  Comprehensive metabolic panel      Status: Abnormal   Collection Time: 12/26/18  8:26 AM  Result Value Ref Range   Sodium 140 135 - 145 mEq/L   Potassium 3.4 (L) 3.5 - 5.1 mEq/L   Chloride 99 96 - 112 mEq/L   CO2 29 19 - 32 mEq/L   Glucose, Bld 104 (H) 70 - 99 mg/dL   BUN 19 6 - 23 mg/dL   Creatinine, Ser 0.88 0.40 - 1.20  mg/dL   Total Bilirubin 0.8 0.2 - 1.2 mg/dL   Alkaline Phosphatase 99 39 - 117 U/L   AST 23 0 - 37 U/L   ALT 31 0 - 35 U/L   Total Protein 7.2 6.0 - 8.3 g/dL   Albumin 4.4 3.5 - 5.2 g/dL   Calcium 9.9 8.4 - 10.5 mg/dL   GFR 63.30 >60.00 mL/min  LDL cholesterol, direct     Status: None   Collection Time: 12/26/18  8:26 AM  Result Value Ref Range   Direct LDL 74.0 mg/dL    Comment: Optimal:  <100 mg/dLNear or Above Optimal:  100-129 mg/dLBorderline High:  130-159 mg/dLHigh:  160-189 mg/dLVery High:  >190 mg/dL   Objective  Body mass index is 37.81 kg/m. Wt Readings from Last 3 Encounters:  01/02/19 227 lb 3.2 oz (103.1 kg)  01/29/18 220 lb 6.4 oz (100 kg)  12/31/17 220 lb 6.4 oz (100 kg)   Temp Readings from Last 3 Encounters:  01/02/19 98.1 F (36.7 C) (Skin)  01/29/18 (!) 97.4 F (36.3 C) (Oral)  12/31/17 98.2 F (36.8 C) (Oral)   BP Readings from Last 3 Encounters:  01/02/19 (!) 150/92  01/29/18 (!) 150/74  12/31/17 138/70   Pulse Readings from Last 3 Encounters:  01/02/19 98  01/29/18 90  12/31/17 76    Physical Exam Vitals signs and nursing note reviewed.  Constitutional:      Appearance: Normal appearance. She is well-developed and well-groomed. She is obese.     Comments: +mask on   HENT:     Head: Normocephalic and atraumatic.  Eyes:     Conjunctiva/sclera: Conjunctivae normal.     Pupils: Pupils are equal, round, and reactive to light.  Cardiovascular:     Rate and Rhythm: Normal rate and regular rhythm.     Heart sounds: Normal heart sounds. No murmur.  Pulmonary:     Effort: Pulmonary effort is normal.     Breath sounds: Wheezing present.     Comments:  RUQ Abdominal:     General: Abdomen is flat. Bowel sounds are normal.     Tenderness: There is no abdominal tenderness.  Skin:    General: Skin is warm and dry.  Neurological:     General: No focal deficit present.     Mental Status: She is alert and oriented to person, place, and time. Mental status is at baseline.     Gait: Gait normal.  Psychiatric:        Attention and Perception: Attention and perception normal.        Mood and Affect: Mood and affect normal.        Speech: Speech normal.        Behavior: Behavior normal. Behavior is cooperative.        Thought Content: Thought content normal.        Cognition and Memory: Cognition and memory normal.        Judgment: Judgment normal.     Assessment  Plan  Essential hypertension - Plan: amLODipine (NORVASC) 2.5-5 MG tablet Cont other meds   Hypokalemia - Plan: potassium chloride (MICRO-K) 10 MEQ CR capsule 2 pills total   Gastroesophageal reflux disease - Plan: pantoprazole (PROTONIX) 40 MG tablet  Hyperlipidemia, unspecified hyperlipidemia type - Plan: atorvastatin (LIPITOR) 40 MG tablet  Gout, unspecified cause, unspecified chronicity, unspecified site - Plan: allopurinol (ZYLOPRIM) 300 MG tablet  Wheezing - Plan: DG Chest 2 View  Chronic low back pain, unspecified back pain laterality, unspecified  whether sciatica present  -pending NS procedure   HM Flu shot had utd 2020  Tdap 05/11/13  prevnar had 05/11/13, pna 12 had 04/09/16 Had zostavax 2014  consider shingrix in future given Rx  Hep B 2/3 had still due for 3rd vaccine has 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 Was due 10/17/17, also had hep A 03/19/17 2nd dose due in 6-12 months h/o fatty liver MMR immune  Colonoscopy overdue disc reconsider another in future   ? Date of last pap per pt no h/o abnormal ptper chart h/o abnormal papDr. Vickki Muff has record. Last I can see 11/21/01 neg pap +BV   Mammogram9/11/19 2.5 cm cyst right breast f/u in 1 year will need to  reorder in futurept to call   DEXA 04/03/12 osteopenia rec calcium 600 mg bid and on vit D3 5000 qd. Vit Dnormal on 5000 IU D3 qd -consider repeat DEXAordered pending   Former smoker total 30 years max 1 ppd no FH lung cancer quit 20 years ago from 2019CXR ordered 2/2 wheezing   Hep C neg 04/09/16  H/o skin cancer Dr. Janit Bern seen for 2020   Provider: Dr. Olivia Mackie McLean-Scocuzza-Internal Medicine

## 2019-01-06 ENCOUNTER — Ambulatory Visit (INDEPENDENT_AMBULATORY_CARE_PROVIDER_SITE_OTHER): Payer: Medicare HMO

## 2019-01-06 ENCOUNTER — Other Ambulatory Visit: Payer: Self-pay

## 2019-01-06 DIAGNOSIS — Z0000 Encounter for general adult medical examination without abnormal findings: Secondary | ICD-10-CM | POA: Diagnosis not present

## 2019-01-06 NOTE — Progress Notes (Signed)
Subjective:   Cynthia Bryan is a 73 y.o. female who presents for Medicare Annual (Subsequent) preventive examination.  Review of Systems:  No ROS.  Medicare Wellness Virtual Visit.  Visual/audio telehealth visit, UTA vital signs.   See social history for additional risk factors.   Cardiac Risk Factors include: advanced age (>70men, >19 women)     Objective:     Vitals: There were no vitals taken for this visit.  There is no height or weight on file to calculate BMI.  Advanced Directives 01/06/2019 12/31/2017 09/19/2017 07/31/2016  Does Patient Have a Medical Advance Directive? Yes Yes Yes No  Type of Paramedic of [place];Living will [place];Living will [place];Living will -  Does patient want to make changes to medical advance directive? No - Patient declined No - Patient declined No - Patient declined -  Copy of [place] in Chart? No - copy requested No - copy requested No - copy requested -  Would patient like information on creating a medical advance directive? - - - No - Patient declined    Tobacco Social History   Tobacco Use  Smoking Status Former Smoker  Smokeless Tobacco Never Used  Tobacco Comment   smoked total 30 years quit 20 years ago from 2019 max 1 ppd no FH lung cancer      Counseling given: Not Answered Comment: smoked total 30 years quit 20 years ago from 2019 max 1 ppd no FH lung cancer    Clinical Intake:  Pre-visit preparation completed: Yes        Diabetes: No  How often do you need to have someone help you when you read instructions, pamphlets, or other written materials from your doctor or pharmacy?: 1 - Never  Interpreter Needed?: No     Past Medical History:  Diagnosis Date  . Arthritis   . Basal cell carcinoma    right temple Dr. Elmer Ramp removed 01/2017   . Chicken pox   . Emphysema of lung ([place])    h/o bronchitis not emphysema  . Fatty  liver    09/19/15  . Gastric ulcer    neg H pylori   . GERD (gastroesophageal reflux disease)   . Gout   . Hiatal hernia   . Hyperlipidemia   . Hypertension   . Kidney stone    noted CT ab/pelvis 09/19/15 left kidney   . Kidney stone    Dr. Rogers Blocker 2019   . Lumbar herniated disc   . Recurrent UTI   . Syncope    per pt presyncope no LOC felt dizzy/vision distorted and couldnt speak EEG neg, CT head neg 07/31/16, f/u [facility] cardiology with US carotid, echo but no Holter   . Umbilical hernia    noted 09/19/15   . UTI (urinary tract infection)    Past Surgical History:  Procedure Laterality Date  . BREAST BIOPSY Right 1980   cyst removed negative pathology   . EXTRACORPOREAL SHOCK WAVE LITHOTRIPSY Left 09/19/2017   Procedure: EXTRACORPOREAL SHOCK WAVE LITHOTRIPSY (ESWL);  Surgeon: Royston Cowper, MD;  Location: [facility] ORS;  Service: Urology;  Laterality: Left;  . JOINT REPLACEMENT     right knee 11/2015 Dr. Janell Quiet [facility]   Family History  Problem Relation Age of Onset  . Breast cancer Mother 75  . Cancer Mother        breast cancer s/p masectomy lived to be 76   .  Hypertension Mother   . Cancer Father        NMSC skin cancer   . Gout Father   . Heart disease Father        s/p bypass surgery age 61 lived 5 months after surgery   . Kidney disease Father        RAS>kidney failure   . Hyperlipidemia Other   . Stroke Other   . Kidney cancer Neg Hx   . Bladder Cancer Neg Hx   . Prolactinoma Neg Hx    Social History   Socioeconomic History  . Marital status: Widowed    Spouse name: Not on file  . Number of children: Not on file  . Years of education: Not on file  . Highest education level: Not on file  Occupational History  . Not on file  Social Needs  . Financial resource strain: Not hard at all  . Food insecurity    Worry: Never true    Inability: Never true  . Transportation needs    Medical: No    Non-medical: No  Tobacco Use  . Smoking status: Former  Research scientist (life sciences)  . Smokeless tobacco: Never Used  . Tobacco comment: smoked total 30 years quit 20 years ago from 2019 max 1 ppd no FH lung cancer   Substance and Sexual Activity  . Alcohol use: No  . Drug use: Never  . Sexual activity: Not on file  Lifestyle  . Physical activity    Days per week: 0 days    Minutes per session: Not on file  . Stress: Not at all  Relationships  . Social Herbalist on phone: Not on file    Gets together: Not on file    Attends religious service: Not on file    Active member of club or organization: Not on file    Attends meetings of clubs or organizations: Not on file    Relationship status: Not on file  Other Topics Concern  . Not on file  Social History Narrative   2 years college    Retired [employer]     Outpatient Encounter Medications as of 01/06/2019  Medication Sig  . acetaminophen (TYLENOL) 500 MG tablet Take 1,000 mg by mouth 2 (two) times daily.   Marland Kitchen allopurinol (ZYLOPRIM) 300 MG tablet Take 1 tablet (300 mg total) by mouth daily.  Marland Kitchen amLODipine (NORVASC) 2.5 MG tablet Take 1-2 tablets (2.5-5 mg total) by mouth daily. Start with 2.5 mg dose  . atorvastatin (LIPITOR) 40 MG tablet Take 1 tablet (40 mg total) by mouth daily at 6 PM.  . calcium carbonate 1250 MG capsule Take 1,250 mg by mouth 2 (two) times daily with a meal.  . Cholecalciferol (VITAMIN D3) 5000 units CAPS Take by mouth daily.  Marland Kitchen CRANBERRY PO Take 4,200 mg by mouth daily.  . Cyanocobalamin (B-12) 5000 MCG CAPS Take by mouth daily.  Marland Kitchen gabapentin (NEURONTIN) 100 MG capsule TAKE 1 CAPSULE (100 MG TOTAL) BY MOUTH AT BEDTIME.  . Magnesium 500 MG CAPS Take 1 tablet by mouth at bedtime.  . methocarbamol (ROBAXIN) 750 MG tablet Take 1 tablet (750 mg total) by mouth at bedtime as needed for muscle spasms.  . pantoprazole (PROTONIX) 40 MG tablet Take 1 tablet (40 mg total) by mouth daily.  . potassium chloride (MICRO-K) 10 MEQ CR capsule Take 1 capsule (10 mEq total) by mouth 2 (two)  times daily.  . traMADol (ULTRAM) 50 MG tablet Take 1 tablet (  50 mg total) by mouth 3 (three) times daily as needed.  . triamterene-hydrochlorothiazide (MAXZIDE-25) 37.5-25 MG tablet Take 1 tablet by mouth daily.   No facility-administered encounter medications on file as of 01/06/2019.     Activities of Daily Living In your present state of health, do you have any difficulty performing the following activities: 01/06/2019  Hearing? N  Vision? N  Difficulty concentrating or making decisions? N  Walking or climbing stairs? Y  Dressing or bathing? N  Doing errands, shopping? N  Preparing Food and eating ? N  Using the Toilet? N  In the past six months, have you accidently leaked urine? N  Do you have problems with loss of bowel control? N  Managing your Medications? N  Managing your Finances? N  Housekeeping or managing your Housekeeping? Y  Some recent data might be hidden    Patient Care Team: McLean-Scocuzza, Nino Glow, MD as PCP - General (Internal Medicine)    Assessment:   This is a routine wellness examination for [place].  Nurse connected with patient 01/06/19 at  9:30 AM EST by a telephone enabled telemedicine application and verified that I am speaking with the correct person using two identifiers. Patient stated full name and DOB. Patient gave permission to continue with virtual visit. Patient's location was at home and Nurse's location was at [place] office.   Health Maintenance Due: -Colonoscopy- discussed. States Cologuard to be ordered by pcp. Last visit 01/02/19. I updated health maintenance to reflect.  Update all pending maintenance due as appropriate.   See completed HM at the end of note.   Eye: Visual acuity not assessed. Virtual visit. Wears corrective lenses. Followed by their ophthalmologist.   Dental: Visits every 6 months.    Hearing: Demonstrates normal hearing during visit.  Safety:  Patient feels safe at home- yes Patient does have smoke detectors  at home- yes Patient does wear sunscreen or protective clothing when in direct sunlight - yes Patient does wear seat belt when in a moving vehicle - yes Patient drives- yes Adequate lighting in walkways free from debris- yes Grab bars and handrails used as appropriate- yes Ambulates with cane as needed assistive device Cell phone on person when ambulating outside of the home- yes  Social: Alcohol intake - no Smoking history- former    Smokers in home? none Illicit drug use? none  Depression: PHQ 2 &9 complete. See screening below. Denies irritability, anhedonia, sadness/tearfullness.    Falls: See screening below.    Medication: Taking as directed and without issues.   Covid-19: Precautions and sickness symptoms discussed. Wears mask, social distancing, hand hygiene as appropriate.   Activities of Daily Living Patient denies needing assistance with: household chores, feeding themselves, getting from bed to chair, getting to the toilet, bathing/showering, dressing, managing money, or preparing meals.   Memory: Patient is alert. Patient denies difficulty focusing or concentrating. Correctly identified the president of the [place], season and recall. 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   BMI- discussed the importance of a healthy diet, water intake and the benefits of aerobic exercise.  Educational material provided.  Physical activity- limited due to chronic back pain.  Diet:  Regular Water: good intake   Other Providers Patient Care Team: McLean-Scocuzza, Nino Glow, MD as PCP - General (Internal Medicine)  Exercise Activities and Dietary recommendations Current Exercise Habits: The patient does not participate in regular exercise at present  Goals      Patient Stated   .  Blood Pressure < 140/90 (pt-stated)     I plan to spot check my pressure     . DIET - INCREASE LEAN PROTEINS (pt-stated)     DASH diet    . Increase physical activity  (pt-stated)     I want to be able to walk again and lose weight        Fall Risk Fall Risk  01/06/2019 01/02/2019 12/31/2017 07/03/2017  Falls in the past year? 0 0 No No   Timed Get Up and Go performed: no, virtual visit  Depression Screen PHQ 2/9 Scores 01/06/2019 01/02/2019 12/31/2017 07/03/2017  PHQ - 2 Score 0 0 0 0     Cognitive Function MMSE - Mini Mental State Exam 12/31/2017  Orientation to time 5  Orientation to Place 5  Registration 3  Attention/ Calculation 5  Recall 3  Language- name 2 objects 2  Language- repeat 1  Language- follow 3 step command 3  Language- read & follow direction 1  Write a sentence 1  Copy design 1  Total score 30     6CIT Screen 01/06/2019  What Year? 0 points  What month? 0 points  What time? 0 points  Count back from 20 0 points  Months in reverse 0 points    Immunization History  Administered Date(s) Administered  . Hepatitis A 08/25/2003, 03/19/2017  . Hepatitis B 03/19/2017, 04/22/2017  . Influenza, High Dose Seasonal PF 12/07/2014, 01/10/2017, 11/18/2018  . Influenza,inj,Quad PF,6+ Mos 12/30/2013, 04/09/2016  . Influenza,inj,quad, With Preservative 12/04/2014  . Influenza-Unspecified 03/17/2012, 12/30/2013, 10/15/2017  . Pneumococcal Conjugate-13 05/11/2013, 10/08/2018  . Pneumococcal Polysaccharide-23 04/09/2016  . Pneumococcal-Unspecified 03/05/2014  . Td 09/17/2003  . Tdap 05/11/2013, 10/08/2018  . Zoster 05/16/2002   Screening Tests Health Maintenance  Topic Date Due  . Fecal DNA (Cologuard)  09/19/1997  . MAMMOGRAM  11/02/2019  . TETANUS/TDAP  10/07/2028  . INFLUENZA VACCINE  Completed  . DEXA SCAN  Completed  . Hepatitis C Screening  Completed  . PNA vac Low Risk Adult  Completed      Plan:   Keep all routine maintenance appointments.   Medicare Attestation I have personally reviewed: The patient's medical and social history Their use of alcohol, tobacco or illicit drugs Their current medications and  supplements The patient's functional ability including ADLs,fall risks, home safety risks, cognitive, and hearing and visual impairment Diet and physical activities Evidence for depression   In addition, I have reviewed and discussed with patient certain preventive protocols, quality metrics, and best practice recommendations. A written personalized care plan for preventive services as well as general preventive health recommendations were provided to patient via mail.     Varney Biles, LPN  [id number]

## 2019-01-06 NOTE — Patient Instructions (Addendum)
  Cynthia Bryan , Thank you for taking time to come for your Medicare Wellness Visit. I appreciate your ongoing commitment to your health goals. Please review the following plan we discussed and let me know if I can assist you in the future.   These are the goals we discussed: Goals      Patient Stated   . Blood Pressure < 140/90 (pt-stated)     I plan to spot check my pressure     . DIET - INCREASE LEAN PROTEINS (pt-stated)     DASH diet    . Increase physical activity (pt-stated)     I want to be able to walk again and lose weight        This is a list of the screening recommended for you and due dates:  Health Maintenance  Topic Date Due  . Cologuard (Stool DNA test)  09/19/1997  . Mammogram  11/02/2019  . Tetanus Vaccine  10/07/2028  . Flu Shot  Completed  . DEXA scan (bone density measurement)  Completed  .  Hepatitis C: One time screening is recommended by Center for Disease Control  (CDC) for  adults born from [birth year] through 1963.   Completed  . Pneumonia vaccines  Completed

## 2019-01-09 ENCOUNTER — Other Ambulatory Visit: Payer: Self-pay

## 2019-01-09 ENCOUNTER — Ambulatory Visit
Admission: RE | Admit: 2019-01-09 | Discharge: 2019-01-09 | Disposition: A | Discharge status: Home / Self Care | Payer: Medicare HMO | Source: Ambulatory Visit | Attending: Internal Medicine | Admitting: Internal Medicine

## 2019-01-09 ENCOUNTER — Ambulatory Visit
Admission: RE | Admit: 2019-01-09 | Discharge: 2019-01-09 | Disposition: A | Discharge status: Home / Self Care | Payer: Medicare HMO | Attending: Internal Medicine | Admitting: Internal Medicine

## 2019-01-09 DIAGNOSIS — R062 Wheezing: Secondary | ICD-10-CM | POA: Insufficient documentation

## 2019-01-09 DIAGNOSIS — R0602 Shortness of breath: Secondary | ICD-10-CM | POA: Diagnosis not present

## 2019-01-13 DIAGNOSIS — M47816 Spondylosis without myelopathy or radiculopathy, lumbar region: Secondary | ICD-10-CM | POA: Diagnosis not present

## 2019-01-20 DIAGNOSIS — M47816 Spondylosis without myelopathy or radiculopathy, lumbar region: Secondary | ICD-10-CM | POA: Diagnosis not present

## 2019-02-11 DIAGNOSIS — M47816 Spondylosis without myelopathy or radiculopathy, lumbar region: Secondary | ICD-10-CM | POA: Diagnosis not present

## 2019-02-11 DIAGNOSIS — M461 Sacroiliitis, not elsewhere classified: Secondary | ICD-10-CM | POA: Diagnosis not present

## 2019-02-24 DIAGNOSIS — M461 Sacroiliitis, not elsewhere classified: Secondary | ICD-10-CM | POA: Diagnosis not present

## 2019-02-24 DIAGNOSIS — M533 Sacrococcygeal disorders, not elsewhere classified: Secondary | ICD-10-CM | POA: Diagnosis not present

## 2019-03-16 DIAGNOSIS — R69 Illness, unspecified: Secondary | ICD-10-CM | POA: Diagnosis not present

## 2019-03-22 ENCOUNTER — Other Ambulatory Visit: Payer: Self-pay | Admitting: Internal Medicine

## 2019-03-22 DIAGNOSIS — M5416 Radiculopathy, lumbar region: Secondary | ICD-10-CM

## 2019-03-22 DIAGNOSIS — M5126 Other intervertebral disc displacement, lumbar region: Secondary | ICD-10-CM

## 2019-03-22 MED ORDER — METHOCARBAMOL 750 MG PO TABS: 750.0000 mg | ORAL_TABLET | Freq: Every evening | ORAL | 5 refills | Status: DC | PRN

## 2019-03-26 DIAGNOSIS — M47816 Spondylosis without myelopathy or radiculopathy, lumbar region: Secondary | ICD-10-CM | POA: Diagnosis not present

## 2019-04-07 ENCOUNTER — Other Ambulatory Visit: Payer: Self-pay | Admitting: Internal Medicine

## 2019-04-07 DIAGNOSIS — M5126 Other intervertebral disc displacement, lumbar region: Secondary | ICD-10-CM

## 2019-04-07 DIAGNOSIS — M5416 Radiculopathy, lumbar region: Secondary | ICD-10-CM

## 2019-04-07 DIAGNOSIS — M25552 Pain in left hip: Secondary | ICD-10-CM

## 2019-04-07 DIAGNOSIS — M25551 Pain in right hip: Secondary | ICD-10-CM

## 2019-04-07 DIAGNOSIS — R937 Abnormal findings on diagnostic imaging of other parts of musculoskeletal system: Secondary | ICD-10-CM

## 2019-04-07 MED ORDER — TRAMADOL HCL 50 MG PO TABS: 50.0000 mg | ORAL_TABLET | Freq: Three times a day (TID) | ORAL | 2 refills | Status: DC | PRN

## 2019-04-14 DIAGNOSIS — M47816 Spondylosis without myelopathy or radiculopathy, lumbar region: Secondary | ICD-10-CM | POA: Diagnosis not present

## 2019-04-21 DIAGNOSIS — M47816 Spondylosis without myelopathy or radiculopathy, lumbar region: Secondary | ICD-10-CM | POA: Diagnosis not present

## 2019-05-05 DIAGNOSIS — M47816 Spondylosis without myelopathy or radiculopathy, lumbar region: Secondary | ICD-10-CM | POA: Diagnosis not present

## 2019-05-12 DIAGNOSIS — M47816 Spondylosis without myelopathy or radiculopathy, lumbar region: Secondary | ICD-10-CM | POA: Diagnosis not present

## 2019-05-28 ENCOUNTER — Ambulatory Visit: Payer: Medicare HMO | Attending: Internal Medicine

## 2019-05-28 DIAGNOSIS — Z23 Encounter for immunization: Secondary | ICD-10-CM

## 2019-05-28 NOTE — Progress Notes (Signed)
   Covid-19 Vaccination Clinic  Name:  Cynthia Bryan    MRN: [id number] DOB: 1945-07-03  05/28/2019  Cynthia Bryan was observed post Covid-19 immunization for 15 minutes without incident. She was provided with Vaccine Information Sheet and instruction to access the V-Safe system.   Cynthia Bryan was instructed to call 911 with any severe reactions post vaccine: Marland Kitchen Difficulty breathing  . Swelling of face and throat  . A fast heartbeat  . A bad rash all over body  . Dizziness and weakness   Immunizations Administered    Name Date Dose VIS Date Route   Pfizer COVID-19 Vaccine 05/28/2019  8:17 AM 0.3 mL 02/13/2019 Intramuscular   Manufacturer: [place]   Lot: [id number]   [place]: [id number]

## 2019-06-23 ENCOUNTER — Ambulatory Visit: Payer: Medicare HMO | Attending: Internal Medicine

## 2019-06-23 DIAGNOSIS — M5136 Other intervertebral disc degeneration, lumbar region: Secondary | ICD-10-CM | POA: Diagnosis not present

## 2019-06-23 DIAGNOSIS — M9903 Segmental and somatic dysfunction of lumbar region: Secondary | ICD-10-CM | POA: Diagnosis not present

## 2019-06-23 DIAGNOSIS — M5416 Radiculopathy, lumbar region: Secondary | ICD-10-CM | POA: Diagnosis not present

## 2019-06-23 DIAGNOSIS — M9904 Segmental and somatic dysfunction of sacral region: Secondary | ICD-10-CM | POA: Diagnosis not present

## 2019-06-23 DIAGNOSIS — Z23 Encounter for immunization: Secondary | ICD-10-CM

## 2019-06-23 NOTE — Progress Notes (Signed)
   Covid-19 Vaccination Clinic  Name:  Cynthia Bryan    MRN: [id number] DOB: [birth year]  06/23/2019  [name] was observed post Covid-19 immunization for 15 minutes without incident. She was provided with Vaccine Information Sheet and instruction to access the V-Safe system.   [name] was instructed to call 911 with any severe reactions post vaccine: Marland Kitchen Difficulty breathing  . Swelling of face and throat  . A fast heartbeat  . A bad rash all over body  . Dizziness and weakness   Immunizations Administered    Name Date Dose VIS Date Route   Pfizer COVID-19 Vaccine 06/23/2019  8:21 AM 0.3 mL 04/29/2018 Intramuscular   Manufacturer: [employer], [employer]   Lot: [id number]   [place]: [id number]

## 2019-06-24 ENCOUNTER — Other Ambulatory Visit: Payer: Self-pay | Admitting: Internal Medicine

## 2019-06-24 DIAGNOSIS — M5416 Radiculopathy, lumbar region: Secondary | ICD-10-CM | POA: Diagnosis not present

## 2019-06-24 DIAGNOSIS — M9904 Segmental and somatic dysfunction of sacral region: Secondary | ICD-10-CM | POA: Diagnosis not present

## 2019-06-24 DIAGNOSIS — I10 Essential (primary) hypertension: Secondary | ICD-10-CM

## 2019-06-24 DIAGNOSIS — R69 Illness, unspecified: Secondary | ICD-10-CM | POA: Diagnosis not present

## 2019-06-24 DIAGNOSIS — M5136 Other intervertebral disc degeneration, lumbar region: Secondary | ICD-10-CM | POA: Diagnosis not present

## 2019-06-24 DIAGNOSIS — M9903 Segmental and somatic dysfunction of lumbar region: Secondary | ICD-10-CM | POA: Diagnosis not present

## 2019-06-24 MED ORDER — TRIAMTERENE-HCTZ 37.5-25 MG PO TABS: 1.0000 | ORAL_TABLET | Freq: Every day | ORAL | 3 refills | Status: DC

## 2019-06-25 DIAGNOSIS — M9903 Segmental and somatic dysfunction of lumbar region: Secondary | ICD-10-CM | POA: Diagnosis not present

## 2019-06-25 DIAGNOSIS — M5136 Other intervertebral disc degeneration, lumbar region: Secondary | ICD-10-CM | POA: Diagnosis not present

## 2019-06-25 DIAGNOSIS — M5416 Radiculopathy, lumbar region: Secondary | ICD-10-CM | POA: Diagnosis not present

## 2019-06-25 DIAGNOSIS — M9904 Segmental and somatic dysfunction of sacral region: Secondary | ICD-10-CM | POA: Diagnosis not present

## 2019-06-29 DIAGNOSIS — M5136 Other intervertebral disc degeneration, lumbar region: Secondary | ICD-10-CM | POA: Diagnosis not present

## 2019-06-29 DIAGNOSIS — M5416 Radiculopathy, lumbar region: Secondary | ICD-10-CM | POA: Diagnosis not present

## 2019-06-29 DIAGNOSIS — M9904 Segmental and somatic dysfunction of sacral region: Secondary | ICD-10-CM | POA: Diagnosis not present

## 2019-06-29 DIAGNOSIS — M9903 Segmental and somatic dysfunction of lumbar region: Secondary | ICD-10-CM | POA: Diagnosis not present

## 2019-07-01 DIAGNOSIS — M9903 Segmental and somatic dysfunction of lumbar region: Secondary | ICD-10-CM | POA: Diagnosis not present

## 2019-07-01 DIAGNOSIS — M9904 Segmental and somatic dysfunction of sacral region: Secondary | ICD-10-CM | POA: Diagnosis not present

## 2019-07-01 DIAGNOSIS — M5416 Radiculopathy, lumbar region: Secondary | ICD-10-CM | POA: Diagnosis not present

## 2019-07-01 DIAGNOSIS — M5136 Other intervertebral disc degeneration, lumbar region: Secondary | ICD-10-CM | POA: Diagnosis not present

## 2019-07-02 DIAGNOSIS — M5136 Other intervertebral disc degeneration, lumbar region: Secondary | ICD-10-CM | POA: Diagnosis not present

## 2019-07-02 DIAGNOSIS — M9903 Segmental and somatic dysfunction of lumbar region: Secondary | ICD-10-CM | POA: Diagnosis not present

## 2019-07-02 DIAGNOSIS — M9904 Segmental and somatic dysfunction of sacral region: Secondary | ICD-10-CM | POA: Diagnosis not present

## 2019-07-02 DIAGNOSIS — M5416 Radiculopathy, lumbar region: Secondary | ICD-10-CM | POA: Diagnosis not present

## 2019-07-06 DIAGNOSIS — M5136 Other intervertebral disc degeneration, lumbar region: Secondary | ICD-10-CM | POA: Diagnosis not present

## 2019-07-06 DIAGNOSIS — M5416 Radiculopathy, lumbar region: Secondary | ICD-10-CM | POA: Diagnosis not present

## 2019-07-06 DIAGNOSIS — M9904 Segmental and somatic dysfunction of sacral region: Secondary | ICD-10-CM | POA: Diagnosis not present

## 2019-07-06 DIAGNOSIS — M9903 Segmental and somatic dysfunction of lumbar region: Secondary | ICD-10-CM | POA: Diagnosis not present

## 2019-07-08 DIAGNOSIS — M5416 Radiculopathy, lumbar region: Secondary | ICD-10-CM | POA: Diagnosis not present

## 2019-07-08 DIAGNOSIS — M5136 Other intervertebral disc degeneration, lumbar region: Secondary | ICD-10-CM | POA: Diagnosis not present

## 2019-07-08 DIAGNOSIS — M9903 Segmental and somatic dysfunction of lumbar region: Secondary | ICD-10-CM | POA: Diagnosis not present

## 2019-07-08 DIAGNOSIS — M9904 Segmental and somatic dysfunction of sacral region: Secondary | ICD-10-CM | POA: Diagnosis not present

## 2019-07-09 DIAGNOSIS — M9903 Segmental and somatic dysfunction of lumbar region: Secondary | ICD-10-CM | POA: Diagnosis not present

## 2019-07-09 DIAGNOSIS — M5416 Radiculopathy, lumbar region: Secondary | ICD-10-CM | POA: Diagnosis not present

## 2019-07-09 DIAGNOSIS — M5136 Other intervertebral disc degeneration, lumbar region: Secondary | ICD-10-CM | POA: Diagnosis not present

## 2019-07-09 DIAGNOSIS — M9904 Segmental and somatic dysfunction of sacral region: Secondary | ICD-10-CM | POA: Diagnosis not present

## 2019-07-13 DIAGNOSIS — M9904 Segmental and somatic dysfunction of sacral region: Secondary | ICD-10-CM | POA: Diagnosis not present

## 2019-07-13 DIAGNOSIS — M5136 Other intervertebral disc degeneration, lumbar region: Secondary | ICD-10-CM | POA: Diagnosis not present

## 2019-07-13 DIAGNOSIS — M5416 Radiculopathy, lumbar region: Secondary | ICD-10-CM | POA: Diagnosis not present

## 2019-07-13 DIAGNOSIS — M9903 Segmental and somatic dysfunction of lumbar region: Secondary | ICD-10-CM | POA: Diagnosis not present

## 2019-07-15 DIAGNOSIS — M9903 Segmental and somatic dysfunction of lumbar region: Secondary | ICD-10-CM | POA: Diagnosis not present

## 2019-07-15 DIAGNOSIS — M5416 Radiculopathy, lumbar region: Secondary | ICD-10-CM | POA: Diagnosis not present

## 2019-07-15 DIAGNOSIS — M5136 Other intervertebral disc degeneration, lumbar region: Secondary | ICD-10-CM | POA: Diagnosis not present

## 2019-07-15 DIAGNOSIS — M9904 Segmental and somatic dysfunction of sacral region: Secondary | ICD-10-CM | POA: Diagnosis not present

## 2019-07-16 DIAGNOSIS — M5136 Other intervertebral disc degeneration, lumbar region: Secondary | ICD-10-CM | POA: Diagnosis not present

## 2019-07-16 DIAGNOSIS — M9903 Segmental and somatic dysfunction of lumbar region: Secondary | ICD-10-CM | POA: Diagnosis not present

## 2019-07-16 DIAGNOSIS — M5416 Radiculopathy, lumbar region: Secondary | ICD-10-CM | POA: Diagnosis not present

## 2019-07-16 DIAGNOSIS — M9904 Segmental and somatic dysfunction of sacral region: Secondary | ICD-10-CM | POA: Diagnosis not present

## 2019-07-20 DIAGNOSIS — M5416 Radiculopathy, lumbar region: Secondary | ICD-10-CM | POA: Diagnosis not present

## 2019-07-20 DIAGNOSIS — M9904 Segmental and somatic dysfunction of sacral region: Secondary | ICD-10-CM | POA: Diagnosis not present

## 2019-07-20 DIAGNOSIS — M5136 Other intervertebral disc degeneration, lumbar region: Secondary | ICD-10-CM | POA: Diagnosis not present

## 2019-07-20 DIAGNOSIS — M9903 Segmental and somatic dysfunction of lumbar region: Secondary | ICD-10-CM | POA: Diagnosis not present

## 2019-07-22 DIAGNOSIS — M5136 Other intervertebral disc degeneration, lumbar region: Secondary | ICD-10-CM | POA: Diagnosis not present

## 2019-07-22 DIAGNOSIS — M5416 Radiculopathy, lumbar region: Secondary | ICD-10-CM | POA: Diagnosis not present

## 2019-07-22 DIAGNOSIS — M9903 Segmental and somatic dysfunction of lumbar region: Secondary | ICD-10-CM | POA: Diagnosis not present

## 2019-07-22 DIAGNOSIS — M9904 Segmental and somatic dysfunction of sacral region: Secondary | ICD-10-CM | POA: Diagnosis not present

## 2019-07-23 DIAGNOSIS — Z79891 Long term (current) use of opiate analgesic: Secondary | ICD-10-CM | POA: Diagnosis not present

## 2019-07-23 DIAGNOSIS — M5416 Radiculopathy, lumbar region: Secondary | ICD-10-CM | POA: Diagnosis not present

## 2019-07-23 DIAGNOSIS — M1990 Unspecified osteoarthritis, unspecified site: Secondary | ICD-10-CM | POA: Diagnosis not present

## 2019-07-23 DIAGNOSIS — M549 Dorsalgia, unspecified: Secondary | ICD-10-CM | POA: Diagnosis not present

## 2019-07-23 DIAGNOSIS — Z008 Encounter for other general examination: Secondary | ICD-10-CM | POA: Diagnosis not present

## 2019-07-23 DIAGNOSIS — Z803 Family history of malignant neoplasm of breast: Secondary | ICD-10-CM | POA: Diagnosis not present

## 2019-07-23 DIAGNOSIS — M9904 Segmental and somatic dysfunction of sacral region: Secondary | ICD-10-CM | POA: Diagnosis not present

## 2019-07-23 DIAGNOSIS — M109 Gout, unspecified: Secondary | ICD-10-CM | POA: Diagnosis not present

## 2019-07-23 DIAGNOSIS — E785 Hyperlipidemia, unspecified: Secondary | ICD-10-CM | POA: Diagnosis not present

## 2019-07-23 DIAGNOSIS — I10 Essential (primary) hypertension: Secondary | ICD-10-CM | POA: Diagnosis not present

## 2019-07-23 DIAGNOSIS — G8929 Other chronic pain: Secondary | ICD-10-CM | POA: Diagnosis not present

## 2019-07-23 DIAGNOSIS — K219 Gastro-esophageal reflux disease without esophagitis: Secondary | ICD-10-CM | POA: Diagnosis not present

## 2019-07-23 DIAGNOSIS — M5136 Other intervertebral disc degeneration, lumbar region: Secondary | ICD-10-CM | POA: Diagnosis not present

## 2019-07-23 DIAGNOSIS — M9903 Segmental and somatic dysfunction of lumbar region: Secondary | ICD-10-CM | POA: Diagnosis not present

## 2019-07-28 DIAGNOSIS — Z85828 Personal history of other malignant neoplasm of skin: Secondary | ICD-10-CM | POA: Diagnosis not present

## 2019-07-28 DIAGNOSIS — M9903 Segmental and somatic dysfunction of lumbar region: Secondary | ICD-10-CM | POA: Diagnosis not present

## 2019-07-28 DIAGNOSIS — M5416 Radiculopathy, lumbar region: Secondary | ICD-10-CM | POA: Diagnosis not present

## 2019-07-28 DIAGNOSIS — Z86018 Personal history of other benign neoplasm: Secondary | ICD-10-CM | POA: Diagnosis not present

## 2019-07-28 DIAGNOSIS — Z872 Personal history of diseases of the skin and subcutaneous tissue: Secondary | ICD-10-CM | POA: Diagnosis not present

## 2019-07-28 DIAGNOSIS — L570 Actinic keratosis: Secondary | ICD-10-CM | POA: Diagnosis not present

## 2019-07-28 DIAGNOSIS — L578 Other skin changes due to chronic exposure to nonionizing radiation: Secondary | ICD-10-CM | POA: Diagnosis not present

## 2019-07-28 DIAGNOSIS — M5136 Other intervertebral disc degeneration, lumbar region: Secondary | ICD-10-CM | POA: Diagnosis not present

## 2019-07-28 DIAGNOSIS — M9904 Segmental and somatic dysfunction of sacral region: Secondary | ICD-10-CM | POA: Diagnosis not present

## 2019-07-30 DIAGNOSIS — M5416 Radiculopathy, lumbar region: Secondary | ICD-10-CM | POA: Diagnosis not present

## 2019-07-30 DIAGNOSIS — M5136 Other intervertebral disc degeneration, lumbar region: Secondary | ICD-10-CM | POA: Diagnosis not present

## 2019-07-30 DIAGNOSIS — M9903 Segmental and somatic dysfunction of lumbar region: Secondary | ICD-10-CM | POA: Diagnosis not present

## 2019-07-30 DIAGNOSIS — M9904 Segmental and somatic dysfunction of sacral region: Secondary | ICD-10-CM | POA: Diagnosis not present

## 2019-08-04 DIAGNOSIS — M5136 Other intervertebral disc degeneration, lumbar region: Secondary | ICD-10-CM | POA: Diagnosis not present

## 2019-08-04 DIAGNOSIS — M5416 Radiculopathy, lumbar region: Secondary | ICD-10-CM | POA: Diagnosis not present

## 2019-08-04 DIAGNOSIS — M9904 Segmental and somatic dysfunction of sacral region: Secondary | ICD-10-CM | POA: Diagnosis not present

## 2019-08-04 DIAGNOSIS — M9903 Segmental and somatic dysfunction of lumbar region: Secondary | ICD-10-CM | POA: Diagnosis not present

## 2019-08-06 DIAGNOSIS — M9904 Segmental and somatic dysfunction of sacral region: Secondary | ICD-10-CM | POA: Diagnosis not present

## 2019-08-06 DIAGNOSIS — M9903 Segmental and somatic dysfunction of lumbar region: Secondary | ICD-10-CM | POA: Diagnosis not present

## 2019-08-06 DIAGNOSIS — M5136 Other intervertebral disc degeneration, lumbar region: Secondary | ICD-10-CM | POA: Diagnosis not present

## 2019-08-06 DIAGNOSIS — M5416 Radiculopathy, lumbar region: Secondary | ICD-10-CM | POA: Diagnosis not present

## 2019-08-11 DIAGNOSIS — M5416 Radiculopathy, lumbar region: Secondary | ICD-10-CM | POA: Diagnosis not present

## 2019-08-11 DIAGNOSIS — M5136 Other intervertebral disc degeneration, lumbar region: Secondary | ICD-10-CM | POA: Diagnosis not present

## 2019-08-11 DIAGNOSIS — M9903 Segmental and somatic dysfunction of lumbar region: Secondary | ICD-10-CM | POA: Diagnosis not present

## 2019-08-11 DIAGNOSIS — M9904 Segmental and somatic dysfunction of sacral region: Secondary | ICD-10-CM | POA: Diagnosis not present

## 2019-08-13 DIAGNOSIS — M5416 Radiculopathy, lumbar region: Secondary | ICD-10-CM | POA: Diagnosis not present

## 2019-08-13 DIAGNOSIS — M5136 Other intervertebral disc degeneration, lumbar region: Secondary | ICD-10-CM | POA: Diagnosis not present

## 2019-08-13 DIAGNOSIS — M9903 Segmental and somatic dysfunction of lumbar region: Secondary | ICD-10-CM | POA: Diagnosis not present

## 2019-08-13 DIAGNOSIS — M9904 Segmental and somatic dysfunction of sacral region: Secondary | ICD-10-CM | POA: Diagnosis not present

## 2019-08-19 DIAGNOSIS — M5136 Other intervertebral disc degeneration, lumbar region: Secondary | ICD-10-CM | POA: Diagnosis not present

## 2019-08-19 DIAGNOSIS — M5416 Radiculopathy, lumbar region: Secondary | ICD-10-CM | POA: Diagnosis not present

## 2019-08-19 DIAGNOSIS — M9903 Segmental and somatic dysfunction of lumbar region: Secondary | ICD-10-CM | POA: Diagnosis not present

## 2019-08-19 DIAGNOSIS — M9904 Segmental and somatic dysfunction of sacral region: Secondary | ICD-10-CM | POA: Diagnosis not present

## 2019-08-26 DIAGNOSIS — M9904 Segmental and somatic dysfunction of sacral region: Secondary | ICD-10-CM | POA: Diagnosis not present

## 2019-08-26 DIAGNOSIS — M5416 Radiculopathy, lumbar region: Secondary | ICD-10-CM | POA: Diagnosis not present

## 2019-08-26 DIAGNOSIS — M5136 Other intervertebral disc degeneration, lumbar region: Secondary | ICD-10-CM | POA: Diagnosis not present

## 2019-08-26 DIAGNOSIS — M9903 Segmental and somatic dysfunction of lumbar region: Secondary | ICD-10-CM | POA: Diagnosis not present

## 2019-08-28 ENCOUNTER — Telehealth: Payer: Self-pay | Admitting: Internal Medicine

## 2019-08-28 DIAGNOSIS — M5136 Other intervertebral disc degeneration, lumbar region: Secondary | ICD-10-CM | POA: Diagnosis not present

## 2019-08-28 DIAGNOSIS — M25551 Pain in right hip: Secondary | ICD-10-CM

## 2019-08-28 DIAGNOSIS — M9903 Segmental and somatic dysfunction of lumbar region: Secondary | ICD-10-CM | POA: Diagnosis not present

## 2019-08-28 DIAGNOSIS — M9904 Segmental and somatic dysfunction of sacral region: Secondary | ICD-10-CM | POA: Diagnosis not present

## 2019-08-28 DIAGNOSIS — M5416 Radiculopathy, lumbar region: Secondary | ICD-10-CM | POA: Diagnosis not present

## 2019-08-28 DIAGNOSIS — M25552 Pain in left hip: Secondary | ICD-10-CM

## 2019-08-28 DIAGNOSIS — M5126 Other intervertebral disc displacement, lumbar region: Secondary | ICD-10-CM

## 2019-08-28 DIAGNOSIS — R937 Abnormal findings on diagnostic imaging of other parts of musculoskeletal system: Secondary | ICD-10-CM

## 2019-08-28 MED ORDER — TRAMADOL HCL 50 MG PO TABS: 50.0000 mg | ORAL_TABLET | Freq: Three times a day (TID) | ORAL | 0 refills | Status: DC | PRN

## 2019-08-28 NOTE — Telephone Encounter (Signed)
Call pt Apologize for our delay here im covering for [employer] this week and just rec'ed message  I have refilled tramadol for one month without refill Please also make an appt with dr Aundra Dubin as its been 6+ months and this is a controlled substance  I looked up patient on [place] Controlled Substances Reporting System PMP AWARE and saw no activity that raised concern of inappropriate use.

## 2019-08-28 NOTE — Telephone Encounter (Signed)
Patient called stating she asked [facility] in [place] to send in her refill request for her traMADol (ULTRAM) 50 MG tablet over a week ago. However it has not been filled. Can another provider fill this medication due to Dr. Olivia Mackie being on vacation.

## 2019-08-31 NOTE — Telephone Encounter (Signed)
Called and informed the Patient of the below. She has been scheduled for 7/14 at 9:00 am for a follow up and fasting labs. Patient has not had labs since 12/2018 and states she believes she is having gout flare ups.   For your information

## 2019-09-01 DIAGNOSIS — M9903 Segmental and somatic dysfunction of lumbar region: Secondary | ICD-10-CM | POA: Diagnosis not present

## 2019-09-01 DIAGNOSIS — M5416 Radiculopathy, lumbar region: Secondary | ICD-10-CM | POA: Diagnosis not present

## 2019-09-01 DIAGNOSIS — M5136 Other intervertebral disc degeneration, lumbar region: Secondary | ICD-10-CM | POA: Diagnosis not present

## 2019-09-01 DIAGNOSIS — M9904 Segmental and somatic dysfunction of sacral region: Secondary | ICD-10-CM | POA: Diagnosis not present

## 2019-09-02 DIAGNOSIS — M461 Sacroiliitis, not elsewhere classified: Secondary | ICD-10-CM | POA: Diagnosis not present

## 2019-09-02 DIAGNOSIS — M5136 Other intervertebral disc degeneration, lumbar region: Secondary | ICD-10-CM | POA: Diagnosis not present

## 2019-09-02 DIAGNOSIS — M47816 Spondylosis without myelopathy or radiculopathy, lumbar region: Secondary | ICD-10-CM | POA: Diagnosis not present

## 2019-09-03 DIAGNOSIS — M5416 Radiculopathy, lumbar region: Secondary | ICD-10-CM | POA: Diagnosis not present

## 2019-09-03 DIAGNOSIS — M9903 Segmental and somatic dysfunction of lumbar region: Secondary | ICD-10-CM | POA: Diagnosis not present

## 2019-09-03 DIAGNOSIS — M9904 Segmental and somatic dysfunction of sacral region: Secondary | ICD-10-CM | POA: Diagnosis not present

## 2019-09-03 DIAGNOSIS — M5136 Other intervertebral disc degeneration, lumbar region: Secondary | ICD-10-CM | POA: Diagnosis not present

## 2019-09-07 ENCOUNTER — Other Ambulatory Visit: Payer: Self-pay | Admitting: Internal Medicine

## 2019-09-07 DIAGNOSIS — N3000 Acute cystitis without hematuria: Secondary | ICD-10-CM

## 2019-09-07 DIAGNOSIS — M1A9XX0 Chronic gout, unspecified, without tophus (tophi): Secondary | ICD-10-CM

## 2019-09-07 DIAGNOSIS — E785 Hyperlipidemia, unspecified: Secondary | ICD-10-CM

## 2019-09-07 DIAGNOSIS — R7303 Prediabetes: Secondary | ICD-10-CM

## 2019-09-07 DIAGNOSIS — I10 Essential (primary) hypertension: Secondary | ICD-10-CM

## 2019-09-08 DIAGNOSIS — M9903 Segmental and somatic dysfunction of lumbar region: Secondary | ICD-10-CM | POA: Diagnosis not present

## 2019-09-08 DIAGNOSIS — M5416 Radiculopathy, lumbar region: Secondary | ICD-10-CM | POA: Diagnosis not present

## 2019-09-08 DIAGNOSIS — M5136 Other intervertebral disc degeneration, lumbar region: Secondary | ICD-10-CM | POA: Diagnosis not present

## 2019-09-08 DIAGNOSIS — M9904 Segmental and somatic dysfunction of sacral region: Secondary | ICD-10-CM | POA: Diagnosis not present

## 2019-09-10 DIAGNOSIS — M5416 Radiculopathy, lumbar region: Secondary | ICD-10-CM | POA: Diagnosis not present

## 2019-09-10 DIAGNOSIS — M9903 Segmental and somatic dysfunction of lumbar region: Secondary | ICD-10-CM | POA: Diagnosis not present

## 2019-09-10 DIAGNOSIS — M9904 Segmental and somatic dysfunction of sacral region: Secondary | ICD-10-CM | POA: Diagnosis not present

## 2019-09-10 DIAGNOSIS — M5136 Other intervertebral disc degeneration, lumbar region: Secondary | ICD-10-CM | POA: Diagnosis not present

## 2019-09-15 DIAGNOSIS — M5136 Other intervertebral disc degeneration, lumbar region: Secondary | ICD-10-CM | POA: Diagnosis not present

## 2019-09-15 DIAGNOSIS — M9903 Segmental and somatic dysfunction of lumbar region: Secondary | ICD-10-CM | POA: Diagnosis not present

## 2019-09-15 DIAGNOSIS — M9904 Segmental and somatic dysfunction of sacral region: Secondary | ICD-10-CM | POA: Diagnosis not present

## 2019-09-15 DIAGNOSIS — M5416 Radiculopathy, lumbar region: Secondary | ICD-10-CM | POA: Diagnosis not present

## 2019-09-16 ENCOUNTER — Ambulatory Visit (INDEPENDENT_AMBULATORY_CARE_PROVIDER_SITE_OTHER): Payer: Medicare HMO | Admitting: Internal Medicine

## 2019-09-16 ENCOUNTER — Ambulatory Visit (INDEPENDENT_AMBULATORY_CARE_PROVIDER_SITE_OTHER): Payer: Medicare HMO

## 2019-09-16 ENCOUNTER — Encounter: Payer: Self-pay | Admitting: Internal Medicine

## 2019-09-16 ENCOUNTER — Other Ambulatory Visit: Payer: Self-pay

## 2019-09-16 VITALS — BP 132/74 | HR 77 | Temp 97.9°F | Ht 65.0 in | Wt 226.6 lb

## 2019-09-16 DIAGNOSIS — N3000 Acute cystitis without hematuria: Secondary | ICD-10-CM

## 2019-09-16 DIAGNOSIS — M5126 Other intervertebral disc displacement, lumbar region: Secondary | ICD-10-CM | POA: Diagnosis not present

## 2019-09-16 DIAGNOSIS — M1A9XX0 Chronic gout, unspecified, without tophus (tophi): Secondary | ICD-10-CM

## 2019-09-16 DIAGNOSIS — M79641 Pain in right hand: Secondary | ICD-10-CM

## 2019-09-16 DIAGNOSIS — R937 Abnormal findings on diagnostic imaging of other parts of musculoskeletal system: Secondary | ICD-10-CM

## 2019-09-16 DIAGNOSIS — R7303 Prediabetes: Secondary | ICD-10-CM | POA: Insufficient documentation

## 2019-09-16 DIAGNOSIS — Z1231 Encounter for screening mammogram for malignant neoplasm of breast: Secondary | ICD-10-CM

## 2019-09-16 DIAGNOSIS — M8580 Other specified disorders of bone density and structure, unspecified site: Secondary | ICD-10-CM | POA: Insufficient documentation

## 2019-09-16 DIAGNOSIS — M5440 Lumbago with sciatica, unspecified side: Secondary | ICD-10-CM

## 2019-09-16 DIAGNOSIS — M7989 Other specified soft tissue disorders: Secondary | ICD-10-CM | POA: Diagnosis not present

## 2019-09-16 DIAGNOSIS — M810 Age-related osteoporosis without current pathological fracture: Secondary | ICD-10-CM | POA: Diagnosis not present

## 2019-09-16 DIAGNOSIS — E2839 Other primary ovarian failure: Secondary | ICD-10-CM | POA: Diagnosis not present

## 2019-09-16 DIAGNOSIS — M109 Gout, unspecified: Secondary | ICD-10-CM

## 2019-09-16 DIAGNOSIS — M25551 Pain in right hip: Secondary | ICD-10-CM | POA: Diagnosis not present

## 2019-09-16 DIAGNOSIS — M5416 Radiculopathy, lumbar region: Secondary | ICD-10-CM | POA: Diagnosis not present

## 2019-09-16 DIAGNOSIS — M19041 Primary osteoarthritis, right hand: Secondary | ICD-10-CM | POA: Diagnosis not present

## 2019-09-16 DIAGNOSIS — E785 Hyperlipidemia, unspecified: Secondary | ICD-10-CM

## 2019-09-16 DIAGNOSIS — I10 Essential (primary) hypertension: Secondary | ICD-10-CM | POA: Diagnosis not present

## 2019-09-16 DIAGNOSIS — M25552 Pain in left hip: Secondary | ICD-10-CM

## 2019-09-16 DIAGNOSIS — M19031 Primary osteoarthritis, right wrist: Secondary | ICD-10-CM | POA: Diagnosis not present

## 2019-09-16 DIAGNOSIS — R195 Other fecal abnormalities: Secondary | ICD-10-CM

## 2019-09-16 LAB — CBC WITH DIFFERENTIAL/PLATELET
Basophils Absolute: 0 10*3/uL (ref 0.0–0.1)
Basophils Relative: 0.4 % (ref 0.0–3.0)
Eosinophils Absolute: 0.2 10*3/uL (ref 0.0–0.7)
Eosinophils Relative: 2.4 % (ref 0.0–5.0)
HCT: 43.2 % (ref 36.0–46.0)
Hemoglobin: 14.1 g/dL (ref 12.0–15.0)
Lymphocytes Relative: 23.7 % (ref 12.0–46.0)
Lymphs Abs: 2.2 10*3/uL (ref 0.7–4.0)
MCHC: 32.6 g/dL (ref 30.0–36.0)
MCV: 97 fl (ref 78.0–100.0)
Monocytes Absolute: 0.5 10*3/uL (ref 0.1–1.0)
Monocytes Relative: 5.9 % (ref 3.0–12.0)
Neutro Abs: 6.2 10*3/uL (ref 1.4–7.7)
Neutrophils Relative %: 67.6 % (ref 43.0–77.0)
Platelets: 208 10*3/uL (ref 150.0–400.0)
RBC: 4.45 Mil/uL (ref 3.87–5.11)
RDW: 17 % — ABNORMAL HIGH (ref 11.5–15.5)
WBC: 9.2 10*3/uL (ref 4.0–10.5)

## 2019-09-16 LAB — LDL CHOLESTEROL, DIRECT: Direct LDL: 77 mg/dL

## 2019-09-16 LAB — LIPID PANEL
Cholesterol: 174 mg/dL (ref 0–200)
HDL: 51.8 mg/dL
NonHDL: 122.06
Total CHOL/HDL Ratio: 3
Triglycerides: 273 mg/dL — ABNORMAL HIGH (ref 0.0–149.0)
VLDL: 54.6 mg/dL — ABNORMAL HIGH (ref 0.0–40.0)

## 2019-09-16 LAB — COMPREHENSIVE METABOLIC PANEL WITH GFR
ALT: 31 U/L (ref 0–35)
AST: 18 U/L (ref 0–37)
Albumin: 4.5 g/dL (ref 3.5–5.2)
Alkaline Phosphatase: 104 U/L (ref 39–117)
BUN: 21 mg/dL (ref 6–23)
CO2: 31 meq/L (ref 19–32)
Calcium: 10.1 mg/dL (ref 8.4–10.5)
Chloride: 96 meq/L (ref 96–112)
Creatinine, Ser: 0.95 mg/dL (ref 0.40–1.20)
GFR: 57.83 mL/min — ABNORMAL LOW
Glucose, Bld: 104 mg/dL — ABNORMAL HIGH (ref 70–99)
Potassium: 4.1 meq/L (ref 3.5–5.1)
Sodium: 137 meq/L (ref 135–145)
Total Bilirubin: 0.9 mg/dL (ref 0.2–1.2)
Total Protein: 6.9 g/dL (ref 6.0–8.3)

## 2019-09-16 LAB — URIC ACID: Uric Acid, Serum: 5.3 mg/dL (ref 2.4–7.0)

## 2019-09-16 LAB — HEMOGLOBIN A1C: Hgb A1c MFr Bld: 6.4 % (ref 4.6–6.5)

## 2019-09-16 MED ORDER — METHOCARBAMOL 750 MG PO TABS: 750.0000 mg | ORAL_TABLET | Freq: Every evening | ORAL | 5 refills | Status: DC | PRN

## 2019-09-16 MED ORDER — TRAMADOL HCL 50 MG PO TABS: 100.0000 mg | ORAL_TABLET | Freq: Three times a day (TID) | ORAL | 2 refills | Status: DC | PRN

## 2019-09-16 MED ORDER — GABAPENTIN 300 MG PO CAPS: 300.0000 mg | ORAL_CAPSULE | Freq: Every day | ORAL | 3 refills | Status: DC

## 2019-09-16 NOTE — Progress Notes (Signed)
Patient presenting with swelling and joint pain in her left middle finger. Patient has a history of gout and states she has not been eating well over the pandemic.   Patient having ongoing back pain. States she has been seeing a Restaurant manager, fast food and has a history of arthritis. Pain is 5/10. No know injury or falls.   Patient flagged: Current status:  OVERDUE FOR COLORECTAL CANCER SCREENING

## 2019-09-16 NOTE — Progress Notes (Signed)
Chief Complaint  Patient presents with  . Follow-up  . Gout  . Back Pain   F/u  1. HTN controlled on maxzide 37.5-25 mg qd and norvasc 2.5 mg qd  2. Chronic back pain with abnormal imaging s/p ablation x 2 1 in [place] and 1 [facility]/u 09/[facility]/19 [facility] epidural and seeing 2x per week [place] chiropractor pain 5/10 interferes with walking on tramadol 50 mg tid and not helping and gabapentin 300 mg qhs 3. Right PIP hand joint pain and swelling taking cherry pills and tart cherry drink and swelling reduced but still there right hand middle finger  Had pain also in right great toe so thought was gout  Duration few weeks   Review of Systems  Constitutional: Negative for weight loss.  HENT: Negative for hearing loss.   Eyes: Negative for blurred vision.  Respiratory: Negative for shortness of breath.   Cardiovascular: Negative for chest pain.  Gastrointestinal: Negative for abdominal pain.  Musculoskeletal: Positive for back pain and joint pain.  Skin: Negative for rash.  Neurological: Negative for headaches.  Psychiatric/Behavioral: Negative for depression and memory loss.   Past Medical History:  Diagnosis Date  . Arthritis   . Basal cell carcinoma    right temple Dr. Elmer Ramp removed 01/2017   . Chicken pox   . Emphysema of lung ([place])    h/o bronchitis not emphysema  . Fatty liver    09/19/15  . Gastric ulcer    neg H pylori   . GERD (gastroesophageal reflux disease)   . Gout   . Hiatal hernia   . Hyperlipidemia   . Hypertension   . Kidney stone    noted CT ab/pelvis 09/19/15 left kidney   . Kidney stone    Dr. Rogers Blocker 2019   . Lumbar herniated disc   . Recurrent UTI   . Syncope    per pt presyncope no LOC felt dizzy/vision distorted and couldnt speak EEG neg, CT head neg 07/31/16, f/u [facility]ology with US carotid, echo but no Holter   . Umbilical hernia    noted 09/19/15   . UTI (urinary tract infection)    Past Surgical History:  Procedure Laterality Date  . BREAST BIOPSY  Right 1980   cyst removed negative pathology   . EXTRACORPOREAL SHOCK WAVE LITHOTRIPSY Left 09/19/2017   Procedure: EXTRACORPOREAL SHOCK WAVE LITHOTRIPSY (ESWL);  Surgeon: Royston Cowper, MD;  Location: [facility] ORS;  Service: Urology;  Laterality: Left;  . JOINT REPLACEMENT     right knee 11/2015 Dr. Janell Quiet [facility]   Family History  Problem Relation Age of Onset  . Breast cancer Mother 53  . Cancer Mother        breast cancer s/p masectomy lived to be 26   . Hypertension Mother   . Cancer Father        NMSC skin cancer   . Gout Father   . Heart disease Father        s/p bypass surgery age 49 lived 5 months after surgery   . Kidney disease Father        RAS>kidney failure   . Hyperlipidemia Other   . Stroke Other   . Kidney cancer Neg Hx   . Bladder Cancer Neg Hx   . Prolactinoma Neg Hx    Social History   Socioeconomic History  . Marital status: Widowed    Spouse name: Not on file  . Number of children: Not on file  .  Years of education: Not on file  . Highest education level: Not on file  Occupational History  . Not on file  Tobacco Use  . Smoking status: Former Research scientist (life sciences)  . Smokeless tobacco: Never Used  . Tobacco comment: smoked total 30 years quit 20 years ago from 2019 max 1 ppd no FH lung cancer   Vaping Use  . Vaping Use: Never used  Substance and Sexual Activity  . Alcohol use: No  . Drug use: Never  . Sexual activity: Not on file  Other Topics Concern  . Not on file  Social History Narrative   2 years college    Retired Customer service manager    Social Determinants of Radio broadcast assistant Strain:   . Difficulty of Paying Living Expenses:   Food Insecurity:   . Worried About Charity fundraiser in the Last Year:   . Arboriculturist in the Last Year:   Transportation Needs:   . Film/video editor (Medical):   Marland Kitchen Lack of Transportation (Non-Medical):   Physical Activity:   . Days of Exercise per Week:   . Minutes of Exercise per Session:   Stress:    . Feeling of Stress :   Social Connections:   . Frequency of Communication with Friends and Family:   . Frequency of Social Gatherings with Friends and Family:   . Attends Religious Services:   . Active Member of Clubs or Organizations:   . Attends Archivist Meetings:   Marland Kitchen Marital Status:   Intimate Partner Violence:   . Fear of Current or Ex-Partner:   . Emotionally Abused:   Marland Kitchen Physically Abused:   . Sexually Abused:    Current Meds  Medication Sig  . acetaminophen (TYLENOL) 500 MG tablet Take 1,000 mg by mouth 2 (two) times daily.   Marland Kitchen allopurinol (ZYLOPRIM) 300 MG tablet Take 1 tablet (300 mg total) by mouth daily.  Marland Kitchen amLODipine (NORVASC) 2.5 MG tablet Take 1-2 tablets (2.5-5 mg total) by mouth daily. Start with 2.5 mg dose  . atorvastatin (LIPITOR) 40 MG tablet Take 1 tablet (40 mg total) by mouth daily at 6 PM.  . calcium carbonate 1250 MG capsule Take 1,250 mg by mouth 2 (two) times daily with a meal.  . CHERRY PO Take by mouth.  . Cholecalciferol (VITAMIN D3) 5000 units CAPS Take by mouth daily.  Marland Kitchen CRANBERRY PO Take 4,200 mg by mouth daily.  . Cyanocobalamin (B-12) 5000 MCG CAPS Take by mouth daily.  Marland Kitchen gabapentin (NEURONTIN) 300 MG capsule Take 1 capsule (300 mg total) by mouth at bedtime.  . Magnesium 500 MG CAPS Take 1 tablet by mouth at bedtime.  . methocarbamol (ROBAXIN) 750 MG tablet Take 1 tablet (750 mg total) by mouth at bedtime as needed for muscle spasms.  . pantoprazole (PROTONIX) 40 MG tablet Take 1 tablet (40 mg total) by mouth daily.  . potassium chloride (MICRO-K) 10 MEQ CR capsule Take 1 capsule (10 mEq total) by mouth 2 (two) times daily.  . traMADol (ULTRAM) 50 MG tablet Take 2 tablets (100 mg total) by mouth 3 (three) times daily as needed.  . triamterene-hydrochlorothiazide (MAXZIDE-25) 37.5-25 MG tablet Take 1 tablet by mouth daily.  . [DISCONTINUED] gabapentin (NEURONTIN) 100 MG capsule TAKE 1 CAPSULE (100 MG TOTAL) BY MOUTH AT BEDTIME.  .  [DISCONTINUED] methocarbamol (ROBAXIN) 750 MG tablet Take 1 tablet (750 mg total) by mouth at bedtime as needed for muscle spasms.  . [DISCONTINUED] traMADol (  ULTRAM) 50 MG tablet Take 1 tablet (50 mg total) by mouth 3 (three) times daily as needed.   Allergies  Allergen Reactions  . Nortriptyline Nausea Only  . Sulfa Antibiotics Rash   No results found for this or any previous visit (from the past 2160 hour(s)). Objective  Body mass index is 37.71 kg/m. Wt Readings from Last 3 Encounters:  09/16/19 226 lb 9.6 oz (102.8 kg)  01/02/19 227 lb 3.2 oz (103.1 kg)  01/29/18 220 lb 6.4 oz (100 kg)   Temp Readings from Last 3 Encounters:  09/16/19 97.9 F (36.6 C) (Oral)  01/02/19 98.1 F (36.7 C) (Skin)  01/29/18 (!) 97.4 F (36.3 C) (Oral)   BP Readings from Last 3 Encounters:  09/16/19 132/74  01/02/19 (!) 150/92  01/29/18 (!) 150/74   Pulse Readings from Last 3 Encounters:  09/16/19 77  01/02/19 98  01/29/18 90    Physical Exam Vitals and nursing note reviewed.  Constitutional:      Appearance: Normal appearance. She is well-developed and well-groomed. She is obese.  HENT:     Head: Normocephalic and atraumatic.  Eyes:     Conjunctiva/sclera: Conjunctivae normal.     Pupils: Pupils are equal, round, and reactive to light.  Cardiovascular:     Rate and Rhythm: Normal rate and regular rhythm.     Heart sounds: Normal heart sounds. No murmur heard.   Pulmonary:     Effort: Pulmonary effort is normal.     Breath sounds: Normal breath sounds.  Abdominal:     General: Abdomen is flat. Bowel sounds are normal.     Tenderness: There is no abdominal tenderness.  Skin:    General: Skin is warm and dry.  Neurological:     General: No focal deficit present.     Mental Status: She is alert and oriented to person, place, and time. Mental status is at baseline.     Gait: Gait normal.  Psychiatric:        Attention and Perception: Attention and perception normal.         Mood and Affect: Mood and affect normal.        Speech: Speech normal.        Behavior: Behavior normal. Behavior is cooperative.        Thought Content: Thought content normal.        Cognition and Memory: Cognition and memory normal.        Judgment: Judgment normal.     Assessment  Plan  Right hand pain - Plan: DG Hand Complete Right  Estrogen deficiency - Plan: DG Bone Density Osteopenia, unspecified location - Plan: DG Bone Density  Lumbar radiculopathy - Plan: methocarbamol (ROBAXIN) 750 MG tablet, traMADol (ULTRAM) 50 MG tablet, gabapentin (NEURONTIN) 300 MG capsule  Lumbar herniated disc - Plan: methocarbamol (ROBAXIN) 750 MG tablet, traMADol (ULTRAM) 50 MG tablet, gabapentin (NEURONTIN) 300 MG capsule  Pain of both hip joints - Plan: traMADol (ULTRAM) 50 MG tablet  Abnormal MRI, lumbar spine - Plan: traMADol (ULTRAM) 50 MG tablet increase to 100 tid new pain contract signed, gabapentin (NEURONTIN) 300 MG capsule  Bilateral low back pain with sciatica, sciatica laterality unspecified, unspecified chronicity - Plan: gabapentin (NEURONTIN) 300 MG capsule  New pain contract signed above   HM Flu shot had utd 2020  Tdap 05/11/13  covid 2/2  prevnar had 05/11/13, pna 12 had 04/09/16 Had zostavax 2014  1/2 shingrix 08/30/19 2nd dose due  Hep B 2/3 had still  due for 3rd vaccine has 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 Was due 10/17/17, also had hep A 03/19/17 2nd dose due in 6-12 months h/o fatty liver MMR immune  Colonoscopy overdue disc reconsider anotherin future Declines wants cologuard   ? Date of last pap per pt no h/o abnormal ptper chart h/o abnormal papDr. Vickki Muff has record. Last I can see 11/21/01 neg pap +BV   Mammogram9/11/19 2.5 cm cyst right breast f/u in 1 year will need to reorder in futurept to call  Ordered 2.5 right breast cyst  DEXA 04/03/12 osteopenia rec calcium 600 mg bid and on vit D3 5000 qd. Vit Dnormal on 5000 IU D3 qd -consider repeat  DEXAorderedpending  Former smoker total 30 years max 1 ppd no FH lung cancer quit 20 years ago from 2019CXR ordered 2/2 wheezing   Hep C neg 04/09/16  H/o skin cancer Dr. Janit Bern seen for 2021 f/u in 2021 ln2 face, arms, hands   Provider: Dr. Olivia Mackie [facility]

## 2019-09-17 DIAGNOSIS — M5416 Radiculopathy, lumbar region: Secondary | ICD-10-CM | POA: Diagnosis not present

## 2019-09-17 DIAGNOSIS — M9904 Segmental and somatic dysfunction of sacral region: Secondary | ICD-10-CM | POA: Diagnosis not present

## 2019-09-17 DIAGNOSIS — M9903 Segmental and somatic dysfunction of lumbar region: Secondary | ICD-10-CM | POA: Diagnosis not present

## 2019-09-17 DIAGNOSIS — M5136 Other intervertebral disc degeneration, lumbar region: Secondary | ICD-10-CM | POA: Diagnosis not present

## 2019-09-17 LAB — URINE CULTURE
MICRO NUMBER:: 10704147
SPECIMEN QUALITY:: ADEQUATE

## 2019-09-17 LAB — URINALYSIS, ROUTINE W REFLEX MICROSCOPIC

## 2019-09-18 ENCOUNTER — Telehealth: Payer: Self-pay | Admitting: Internal Medicine

## 2019-09-18 NOTE — Telephone Encounter (Signed)
Patient informed and verbalized understanding.  See result note. °

## 2019-09-18 NOTE — Telephone Encounter (Signed)
Pt returned your call about results °

## 2019-09-20 ENCOUNTER — Other Ambulatory Visit: Payer: Self-pay | Admitting: Internal Medicine

## 2019-09-20 DIAGNOSIS — E782 Mixed hyperlipidemia: Secondary | ICD-10-CM

## 2019-09-20 MED ORDER — EZETIMIBE 10 MG PO TABS: 10.0000 mg | ORAL_TABLET | Freq: Every day | ORAL | 3 refills | Status: DC

## 2019-09-20 NOTE — Addendum Note (Signed)
Addended by: Orland Mustard on: 09/20/2019 06:44 PM   Modules accepted: Orders

## 2019-09-21 DIAGNOSIS — M9904 Segmental and somatic dysfunction of sacral region: Secondary | ICD-10-CM | POA: Diagnosis not present

## 2019-09-21 DIAGNOSIS — M5416 Radiculopathy, lumbar region: Secondary | ICD-10-CM | POA: Diagnosis not present

## 2019-09-21 DIAGNOSIS — M9903 Segmental and somatic dysfunction of lumbar region: Secondary | ICD-10-CM | POA: Diagnosis not present

## 2019-09-21 DIAGNOSIS — M5136 Other intervertebral disc degeneration, lumbar region: Secondary | ICD-10-CM | POA: Diagnosis not present

## 2019-09-22 DIAGNOSIS — M533 Sacrococcygeal disorders, not elsewhere classified: Secondary | ICD-10-CM | POA: Diagnosis not present

## 2019-09-22 DIAGNOSIS — M461 Sacroiliitis, not elsewhere classified: Secondary | ICD-10-CM | POA: Diagnosis not present

## 2019-09-24 DIAGNOSIS — M5416 Radiculopathy, lumbar region: Secondary | ICD-10-CM | POA: Diagnosis not present

## 2019-09-24 DIAGNOSIS — M9904 Segmental and somatic dysfunction of sacral region: Secondary | ICD-10-CM | POA: Diagnosis not present

## 2019-09-24 DIAGNOSIS — M9903 Segmental and somatic dysfunction of lumbar region: Secondary | ICD-10-CM | POA: Diagnosis not present

## 2019-09-24 DIAGNOSIS — M5136 Other intervertebral disc degeneration, lumbar region: Secondary | ICD-10-CM | POA: Diagnosis not present

## 2019-09-28 DIAGNOSIS — M9903 Segmental and somatic dysfunction of lumbar region: Secondary | ICD-10-CM | POA: Diagnosis not present

## 2019-09-28 DIAGNOSIS — M9904 Segmental and somatic dysfunction of sacral region: Secondary | ICD-10-CM | POA: Diagnosis not present

## 2019-09-28 DIAGNOSIS — M5136 Other intervertebral disc degeneration, lumbar region: Secondary | ICD-10-CM | POA: Diagnosis not present

## 2019-09-28 DIAGNOSIS — M5416 Radiculopathy, lumbar region: Secondary | ICD-10-CM | POA: Diagnosis not present

## 2019-09-29 DIAGNOSIS — Z1212 Encounter for screening for malignant neoplasm of rectum: Secondary | ICD-10-CM | POA: Diagnosis not present

## 2019-09-29 DIAGNOSIS — Z1211 Encounter for screening for malignant neoplasm of colon: Secondary | ICD-10-CM | POA: Diagnosis not present

## 2019-09-29 LAB — COLOGUARD: Cologuard: POSITIVE — AB

## 2019-10-01 DIAGNOSIS — M5416 Radiculopathy, lumbar region: Secondary | ICD-10-CM | POA: Diagnosis not present

## 2019-10-01 DIAGNOSIS — M5136 Other intervertebral disc degeneration, lumbar region: Secondary | ICD-10-CM | POA: Diagnosis not present

## 2019-10-01 DIAGNOSIS — M9904 Segmental and somatic dysfunction of sacral region: Secondary | ICD-10-CM | POA: Diagnosis not present

## 2019-10-01 DIAGNOSIS — M9903 Segmental and somatic dysfunction of lumbar region: Secondary | ICD-10-CM | POA: Diagnosis not present

## 2019-10-06 ENCOUNTER — Encounter: Payer: Self-pay | Admitting: Internal Medicine

## 2019-10-06 ENCOUNTER — Telehealth: Payer: Self-pay | Admitting: Internal Medicine

## 2019-10-06 DIAGNOSIS — M5136 Other intervertebral disc degeneration, lumbar region: Secondary | ICD-10-CM | POA: Diagnosis not present

## 2019-10-06 DIAGNOSIS — M5416 Radiculopathy, lumbar region: Secondary | ICD-10-CM | POA: Diagnosis not present

## 2019-10-06 DIAGNOSIS — M9904 Segmental and somatic dysfunction of sacral region: Secondary | ICD-10-CM | POA: Diagnosis not present

## 2019-10-06 DIAGNOSIS — M9903 Segmental and somatic dysfunction of lumbar region: Secondary | ICD-10-CM | POA: Diagnosis not present

## 2019-10-06 NOTE — Telephone Encounter (Signed)
cologuard + rec colonoscopy to further work this up  Is patient agreeable?

## 2019-10-07 NOTE — Telephone Encounter (Signed)
Pt called returning your call 

## 2019-10-07 NOTE — Telephone Encounter (Signed)
Patient informed and verbalized understanding.  Patient is agreeable. Sates he would be willing to go to [facility] or [facility] for this as she does not want to go to [facility].

## 2019-10-07 NOTE — Telephone Encounter (Signed)
Left message to return call 

## 2019-10-13 DIAGNOSIS — Z20822 Contact with and (suspected) exposure to covid-19: Secondary | ICD-10-CM | POA: Diagnosis not present

## 2019-10-13 DIAGNOSIS — U071 COVID-19: Secondary | ICD-10-CM

## 2019-10-13 HISTORY — DX: COVID-19: U07.1

## 2019-10-14 NOTE — Addendum Note (Signed)
Addended by: Orland Mustard on: 10/14/2019 05:13 PM   Modules accepted: Orders

## 2019-10-14 NOTE — Telephone Encounter (Signed)
Referral sent leb Gi Dr. Havery Moros

## 2019-10-26 ENCOUNTER — Ambulatory Visit
Admission: RE | Admit: 2019-10-26 | Discharge: 2019-10-26 | Disposition: A | Discharge status: Home / Self Care | Payer: Medicare HMO | Attending: Nurse Practitioner | Admitting: Nurse Practitioner

## 2019-10-26 ENCOUNTER — Telehealth: Payer: Self-pay | Admitting: Internal Medicine

## 2019-10-26 ENCOUNTER — Ambulatory Visit
Admission: RE | Admit: 2019-10-26 | Discharge: 2019-10-26 | Disposition: A | Discharge status: Home / Self Care | Payer: Medicare HMO | Source: Ambulatory Visit | Attending: Nurse Practitioner | Admitting: Nurse Practitioner

## 2019-10-26 ENCOUNTER — Encounter: Payer: Self-pay | Admitting: Nurse Practitioner

## 2019-10-26 ENCOUNTER — Other Ambulatory Visit
Admission: RE | Admit: 2019-10-26 | Discharge: 2019-10-26 | Disposition: A | Discharge status: Home / Self Care | Payer: Medicare HMO | Source: Home / Self Care | Attending: Nurse Practitioner | Admitting: Nurse Practitioner

## 2019-10-26 ENCOUNTER — Other Ambulatory Visit: Payer: Self-pay

## 2019-10-26 ENCOUNTER — Telehealth (INDEPENDENT_AMBULATORY_CARE_PROVIDER_SITE_OTHER): Payer: Medicare HMO | Admitting: Nurse Practitioner

## 2019-10-26 VITALS — Temp 96.9°F | Ht 65.0 in | Wt 220.0 lb

## 2019-10-26 DIAGNOSIS — R0609 Other forms of dyspnea: Secondary | ICD-10-CM

## 2019-10-26 DIAGNOSIS — U071 COVID-19: Secondary | ICD-10-CM | POA: Insufficient documentation

## 2019-10-26 DIAGNOSIS — R0602 Shortness of breath: Secondary | ICD-10-CM | POA: Diagnosis not present

## 2019-10-26 DIAGNOSIS — R531 Weakness: Secondary | ICD-10-CM

## 2019-10-26 DIAGNOSIS — R0600 Dyspnea, unspecified: Secondary | ICD-10-CM | POA: Diagnosis present

## 2019-10-26 DIAGNOSIS — R05 Cough: Secondary | ICD-10-CM | POA: Diagnosis not present

## 2019-10-26 LAB — COMPREHENSIVE METABOLIC PANEL WITH GFR
ALT: 26 U/L (ref 0–44)
AST: 23 U/L (ref 15–41)
Albumin: 4.1 g/dL (ref 3.5–5.0)
Alkaline Phosphatase: 86 U/L (ref 38–126)
Anion gap: 15 (ref 5–15)
BUN: 21 mg/dL (ref 8–23)
CO2: 25 mmol/L (ref 22–32)
Calcium: 10 mg/dL (ref 8.9–10.3)
Chloride: 100 mmol/L (ref 98–111)
Creatinine, Ser: 1.06 mg/dL — ABNORMAL HIGH (ref 0.44–1.00)
GFR calc Af Amer: >60 mL/min
GFR calc non Af Amer: 52 mL/min — ABNORMAL LOW
Glucose, Bld: 141 mg/dL — ABNORMAL HIGH (ref 70–99)
Potassium: 3.5 mmol/L (ref 3.5–5.1)
Sodium: 140 mmol/L (ref 135–145)
Total Bilirubin: 1.2 mg/dL (ref 0.3–1.2)
Total Protein: 7.5 g/dL (ref 6.5–8.1)

## 2019-10-26 LAB — CBC WITH DIFFERENTIAL/PLATELET
Abs Immature Granulocytes: 0.02 10*3/uL (ref 0.00–0.07)
Basophils Absolute: 0 10*3/uL (ref 0.0–0.1)
Basophils Relative: 0 %
Eosinophils Absolute: 0.2 10*3/uL (ref 0.0–0.5)
Eosinophils Relative: 2 %
HCT: 41.9 % (ref 36.0–46.0)
Hemoglobin: 14.1 g/dL (ref 12.0–15.0)
Immature Granulocytes: 0 %
Lymphocytes Relative: 28 %
Lymphs Abs: 2.9 10*3/uL (ref 0.7–4.0)
MCH: 31.3 pg (ref 26.0–34.0)
MCHC: 33.7 g/dL (ref 30.0–36.0)
MCV: 93.1 fL (ref 80.0–100.0)
Monocytes Absolute: 0.6 10*3/uL (ref 0.1–1.0)
Monocytes Relative: 6 %
Neutro Abs: 6.8 10*3/uL (ref 1.7–7.7)
Neutrophils Relative %: 64 %
Platelets: 280 10*3/uL (ref 150–400)
RBC: 4.5 MIL/uL (ref 3.87–5.11)
RDW: 15.9 % — ABNORMAL HIGH (ref 11.5–15.5)
WBC: 10.6 10*3/uL — ABNORMAL HIGH (ref 4.0–10.5)
nRBC: 0 % (ref 0.0–0.2)

## 2019-10-26 NOTE — Progress Notes (Signed)
Virtual Visit via Virtual Note  This visit type was conducted due to national recommendations for restrictions regarding the COVID-19 pandemic (e.g. social distancing).  This format is felt to be most appropriate for this patient at this time.  All issues noted in this document were discussed and addressed.  No physical exam was performed (except for noted visual exam findings with Video Visits).   I connected with@ on 10/28/19 at 10:00 AM EDT by a video enabled telemedicine application or telephone and verified that I am speaking with the correct person using two identifiers. Location patient: home Location provider: work or home office Persons participating in the virtual visit: patient, provider  I discussed the limitations, risks, security and privacy concerns of performing an evaluation and management service by telephone and the availability of in person appointments. I also discussed with the patient that there may be a patient responsible charge related to this service. The patient expressed understanding and agreed to proceed.  Reason for visit: Reports of Covid positive test 10/13/2019.  HPI: Cynthia Bryan is a 74 year old with history of HTN, obesity, prediabetes fatty liver, gout, HLD who reports a positive Covid test 13 days ago with congestion, clear drainage out of her right ear, soreness in right ear, left neck soreness, cough, fevers chills, weakness and fatigue.  She really never had shortness of breath and her cough was mild. She did not seek care for her Covid and just stayed home. She has slowly been recovering. She lives alone.   Currently, she is feeling better with mild congestion, continues feeling weak, and breaks out in a cold clammy sweat when she does minimal activity- like shower. She has to lay back down and rest. She has a poor appetite, is hydrating but does not really want to eat food.  She has had no further fever or chills.  Minimal DOE, minimal cough, and no wheezing.   No chest tightness, or chest pain.  No dizziness.  Right ear pain is feeling better, noted a lymph node that was elevated and that has now come down.She does not own a pulse oximeter.   ROS: See pertinent positives and negatives per HPI.  Past Medical History:  Diagnosis Date  . Arthritis   . Basal cell carcinoma    right temple Dr. Elmer Ramp removed 01/2017   . Chicken pox   . Emphysema of lung ([place])    h/o bronchitis not emphysema  . Fatty liver    09/19/15  . Gastric ulcer    neg H pylori   . GERD (gastroesophageal reflux disease)   . Gout   . Hiatal hernia   . Hyperlipidemia   . Hypertension   . Kidney stone    noted CT ab/pelvis 09/19/15 left kidney   . Kidney stone    Dr. Rogers Blocker 2019   . Lumbar herniated disc   . Recurrent UTI   . Syncope    per pt presyncope no LOC felt dizzy/vision distorted and couldnt speak EEG neg, CT head neg 07/31/16, f/u [facility] cardiology with US carotid, echo but no Holter   . Umbilical hernia    noted 09/19/15   . UTI (urinary tract infection)     Past Surgical History:  Procedure Laterality Date  . BREAST BIOPSY Right 1980   cyst removed negative pathology   . EXTRACORPOREAL SHOCK WAVE LITHOTRIPSY Left 09/19/2017   Procedure: EXTRACORPOREAL SHOCK WAVE LITHOTRIPSY (ESWL);  Surgeon: Royston Cowper, MD;  Location: [facility] ORS;  Service: Urology;  Laterality: Left;  . JOINT REPLACEMENT     right knee 11/2015 Dr. Janell Quiet [facility] [place]    Family History  Problem Relation Age of Onset  . Breast cancer Mother 78  . Cancer Mother        breast cancer s/p masectomy lived to be 77   . Hypertension Mother   . Cancer Father        NMSC skin cancer   . Gout Father   . Heart disease Father        s/p bypass surgery age 29 lived 5 months after surgery   . Kidney disease Father        RAS>kidney failure   . Hyperlipidemia Other   . Stroke Other   . Kidney cancer Neg Hx   . Bladder Cancer Neg Hx   . Prolactinoma Neg Hx     SOCIAL HX: Former  smoker   Current Outpatient Medications:  .  acetaminophen (TYLENOL) 500 MG tablet, Take 1,000 mg by mouth 2 (two) times daily. , Disp: , Rfl:  .  allopurinol (ZYLOPRIM) 300 MG tablet, Take 1 tablet (300 mg total) by mouth daily., Disp: 90 tablet, Rfl: 3 .  atorvastatin (LIPITOR) 40 MG tablet, Take 1 tablet (40 mg total) by mouth daily at 6 PM., Disp: 90 tablet, Rfl: 3 .  Cholecalciferol (VITAMIN D3) 5000 units CAPS, Take by mouth daily., Disp: , Rfl:  .  Cyanocobalamin (B-12) 5000 MCG CAPS, Take by mouth daily., Disp: , Rfl:  .  gabapentin (NEURONTIN) 300 MG capsule, Take 1 capsule (300 mg total) by mouth at bedtime., Disp: 90 capsule, Rfl: 3 .  Magnesium 500 MG CAPS, Take 1 tablet by mouth at bedtime., Disp: , Rfl:  .  methocarbamol (ROBAXIN) 750 MG tablet, Take 1 tablet (750 mg total) by mouth at bedtime as needed for muscle spasms., Disp: 30 tablet, Rfl: 5 .  pantoprazole (PROTONIX) 40 MG tablet, Take 1 tablet (40 mg total) by mouth daily., Disp: 90 tablet, Rfl: 3 .  potassium chloride (MICRO-K) 10 MEQ CR capsule, Take 1 capsule (10 mEq total) by mouth 2 (two) times daily., Disp: 180 capsule, Rfl: 3 .  traMADol (ULTRAM) 50 MG tablet, Take 2 tablets (100 mg total) by mouth 3 (three) times daily as needed., Disp: 130 tablet, Rfl: 2 .  triamterene-hydrochlorothiazide (MAXZIDE-25) 37.5-25 MG tablet, Take 1 tablet by mouth daily., Disp: 90 tablet, Rfl: 3  EXAM:  VITALS per patient if applicable:  GENERAL: alert, oriented, appears fatigued  and in no acute distress  HEENT: atraumatic, conjunctiva clear, no obvious abnormalities on inspection of external nose and ears  NECK: normal movements of the head and neck  LUNGS: on inspection no signs of respiratory distress, breathing rate appears normal, no obvious gross SOB, gasping or wheezing  CV: no obvious cyanosis  MS: moves all visible extremities without noticeable abnormality  PSYCH/NEURO: pleasant and cooperative, no obvious  depression or anxiety, speech and thought processing grossly intact  ASSESSMENT AND PLAN:  Discussed the following assessment and plan:  COVID-19 virus infection - Plan: DG Chest 2 View, CBC with Differential/Platelet, Comprehensive metabolic panel  DOE (dyspnea on exertion) - Plan: DG Chest 2 View, CBC with Differential/Platelet, Comprehensive metabolic panel,   Weakness - Plan: DG Chest 2 View, CBC with Differential/Platelet, Comprehensive metabolic panel  No problem-specific Assessment & Plan notes found for this encounter.  Advised:  Please go to [facility] for labs and CXR.    Please ask  your friends to go to the grocery for you. HYDRATE well with a variety of liquids.   Please begin Vit C 1000 mg daily  Vitamin D3 4000 IU daily  Zinc 100 g daily  Quercetin 250 mg -500 mg twice daily   You can also ask them to pick up a arm BP cuff and pulse oximeter  To check yourself at home. This is optional.   Consider [facility] to monitor your oxygen level.  If your oxygen level falls below 90% and stays there,  Return to the ER  For urgent assistance.   Rest, hydrate very well, eat healthy protein food, Tylenol or Advil as directed .   Seek  treatment in the ED if respiratory issues/distress develops or unrelieved chest pain.   I discussed the assessment and treatment plan with the patient. The patient was provided an opportunity to ask questions and all were answered. The patient agreed with the plan and demonstrated an understanding of the instructions.   The patient was advised to call back or seek an in-person evaluation if the symptoms worsen or if the condition fails to improve as anticipated.  I provided 30  minutes of non-face-to-face time during this encounter.  Denice Paradise, NP Adult Nurse Practitioner [place] [phone]

## 2019-10-26 NOTE — Telephone Encounter (Signed)
Virtual appointment has been scheduled.

## 2019-10-26 NOTE — Telephone Encounter (Signed)
Patient called in twice stated that she has covid and very congested and feeling really bad.

## 2019-10-26 NOTE — Patient Instructions (Addendum)
Please go to [facility] for labs and CXR.    Please ask your friends to go to the grocery for you. HYDRATE well with a variety of liquids.   Please begin Vit C 1000 mg daily  Vitamin D3 4000 IU daily  Zinc 100 g daily  Quercetin 250 mg -500 mg twice daily   You can also ask them to pick up a arm BP cuff and pulse oximeter  To check yourself at home. This is optional.   Consider buying a pulse oximeter at [facility] to monitor your oxygen level.  If your oxygen level falls below 90% and stays there,  Return to the ER  For urgent assistance.    Rest, hydrate very well, eat healthy protein food, Tylenol or Advil as directed .   Seek  treatment in the ED if respiratory issues/distress develops or unrelieved chest pain.   Only leave home to seek medical care and must wear a mask in public. Limit contact with family members or caregivers in the home and to notify her family who she was with yesterday that they should quarantine for 14 days. Practice social distancing and to continue to use good preventative care measures such has frequent hand washing.    Your COVID-19 test was resulted as positive.   You should continue your self imposed quarantine  until you can answer "yes" to ALL 3 of the conditions below:   1) Your symptoms (fever, cough, shortness of breath) started 7 or more days ago   2) Your body temperature  has been normal for at least 72 hours (WITHOUT the use of any tylenol, motrin or aleve) . Normal is < 100.4 Farenheit  3) your other flu  like symptoms symptoms are getting better.   YOUR FAMILY or other household contacts, however , even if they feel fine , will need to continue to quarantining themselves  (that means no contact with ANYONE outside of the house)  for 14 days STARTING from the end of your initial 7 day period . (why? because they have been theoretically exposed to you during the entire 7 days of your illness, and they can sheD the virus without symptoms for  that period of time ).     10 Things You Can Do to Manage Your COVID-19 Symptoms at Home If you have possible or confirmed COVID-19: 1. Stay home from work and school. And stay away from other public places. If you must go out, avoid using any kind of public transportation, ridesharing, or taxis. 2. Monitor your symptoms carefully. If your symptoms get worse, call your healthcare provider immediately. 3. Get rest and stay hydrated. 4. If you have a medical appointment, call the healthcare provider ahead of time and tell them that you have or may have COVID-19. 5. For medical emergencies, call 911 and notify the dispatch personnel that you have or may have COVID-19. 6. Cover your cough and sneezes with a tissue or use the inside of your elbow. 7. Wash your hands often with soap and water for at least 20 seconds or clean your hands with an alcohol-based hand sanitizer that contains at least 60% alcohol. 8. As much as possible, stay in a specific room and away from other people in your home. Also, you should use a separate bathroom, if available. If you need to be around other people in or outside of the home, wear a mask. 9. Avoid sharing personal items with other people in your household, like dishes,  towels, and bedding. 10. Clean all surfaces that are touched often, like counters, tabletops, and doorknobs. Use household cleaning sprays or wipes according to the label instructions. [email or web address] 09/03/2018 This information is not intended to replace advice given to you by your health care provider. Make sure you discuss any questions you have with your health care provider. Document Revised: 02/05/2019 Document Reviewed: 02/05/2019 Elsevier Patient Education  [employer].

## 2019-10-28 ENCOUNTER — Encounter: Payer: Self-pay | Admitting: Nurse Practitioner

## 2019-10-28 ENCOUNTER — Telehealth: Payer: Self-pay | Admitting: Nurse Practitioner

## 2019-10-28 NOTE — Telephone Encounter (Signed)
Please call her today for symptom update   Please give her a video appt next week to f/up Covid .

## 2019-10-28 NOTE — Telephone Encounter (Signed)
Patient is doing better today and starting to eat better. Patient scheduled for virtual visit on 11/04/19 at 11:30 am.

## 2019-11-04 ENCOUNTER — Other Ambulatory Visit: Payer: Self-pay

## 2019-11-04 ENCOUNTER — Encounter: Payer: Medicare HMO | Admitting: Nurse Practitioner

## 2019-11-08 NOTE — Progress Notes (Signed)
Erroneous encounter. Patient canceled.

## 2019-11-18 DIAGNOSIS — L821 Other seborrheic keratosis: Secondary | ICD-10-CM | POA: Diagnosis not present

## 2019-11-18 DIAGNOSIS — L298 Other pruritus: Secondary | ICD-10-CM | POA: Diagnosis not present

## 2019-11-18 DIAGNOSIS — L218 Other seborrheic dermatitis: Secondary | ICD-10-CM | POA: Diagnosis not present

## 2019-11-23 ENCOUNTER — Encounter: Payer: Self-pay | Admitting: Internal Medicine

## 2019-12-13 IMAGING — DX DG LUMBAR SPINE COMPLETE 4+V
5 series · 5 of 5 positions shown · non-contrast
Comparison: Abdominal CT 09/19/2015

CLINICAL DATA: Low back pain over the last 2 months since over
exertion.

EXAM:
LUMBAR SPINE - COMPLETE 4+ VIEW

[lumbar spine ap]
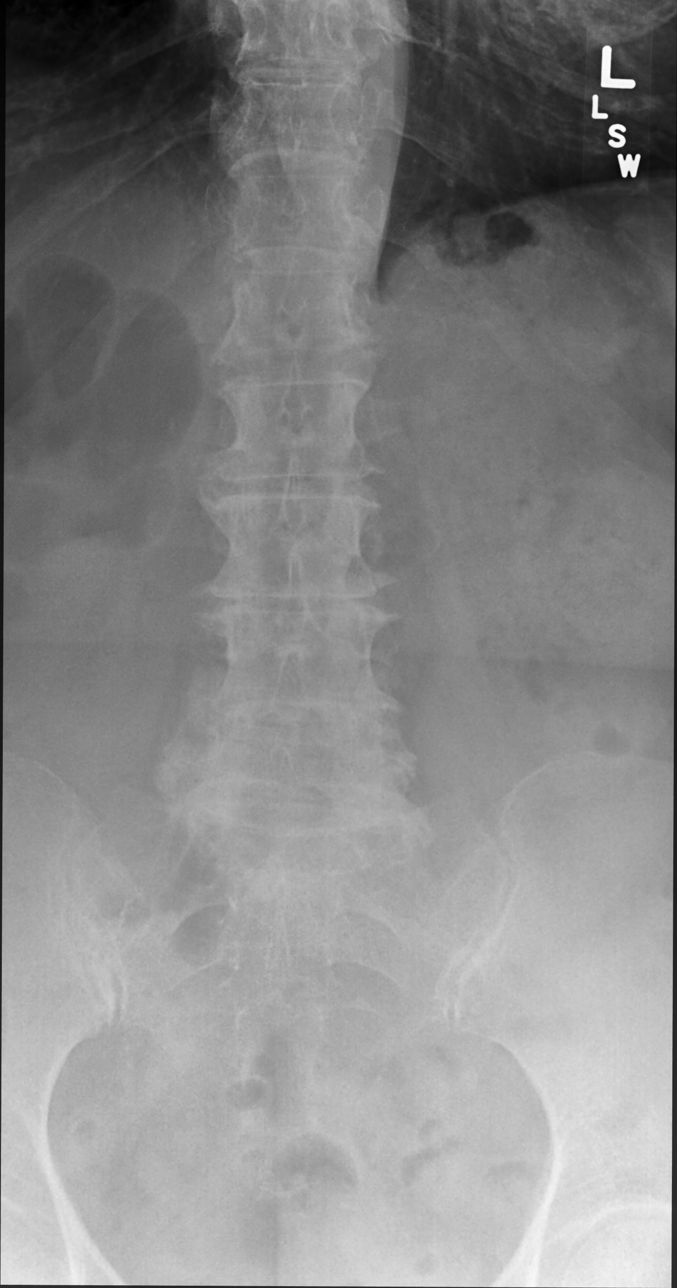

[lumbar spine obl (oblique) (1 of 2)]
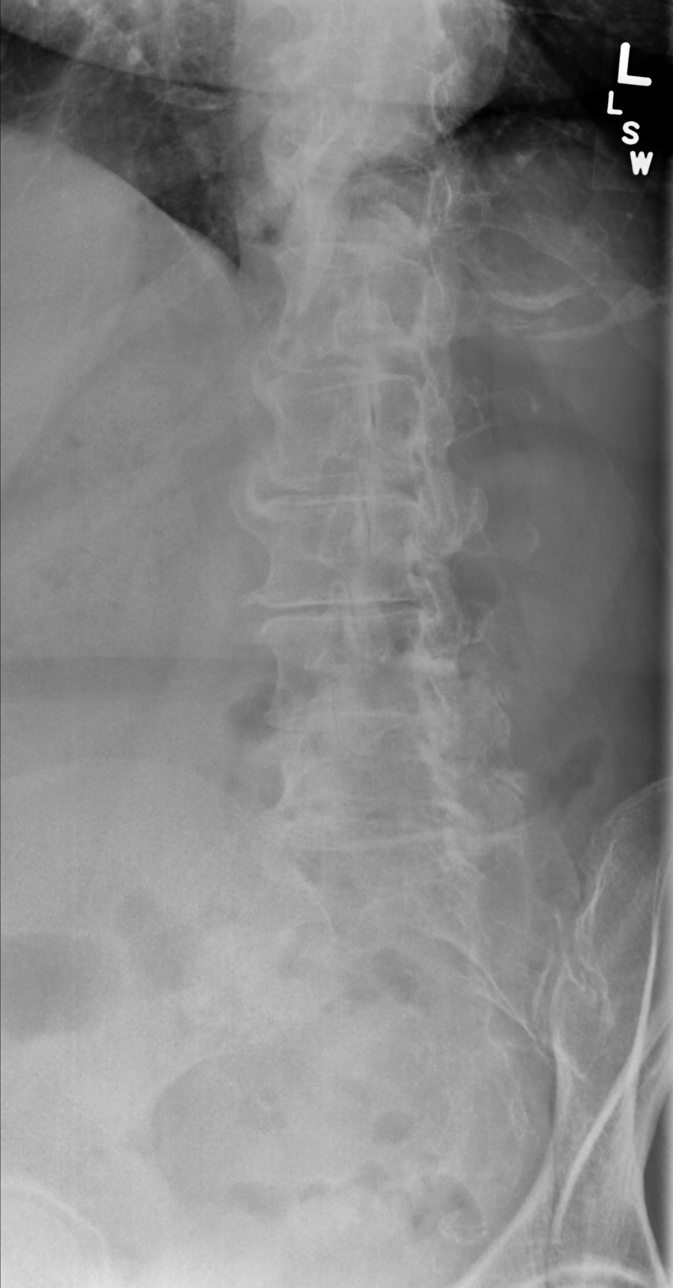

[lumbar spine obl (oblique) (2 of 2)]
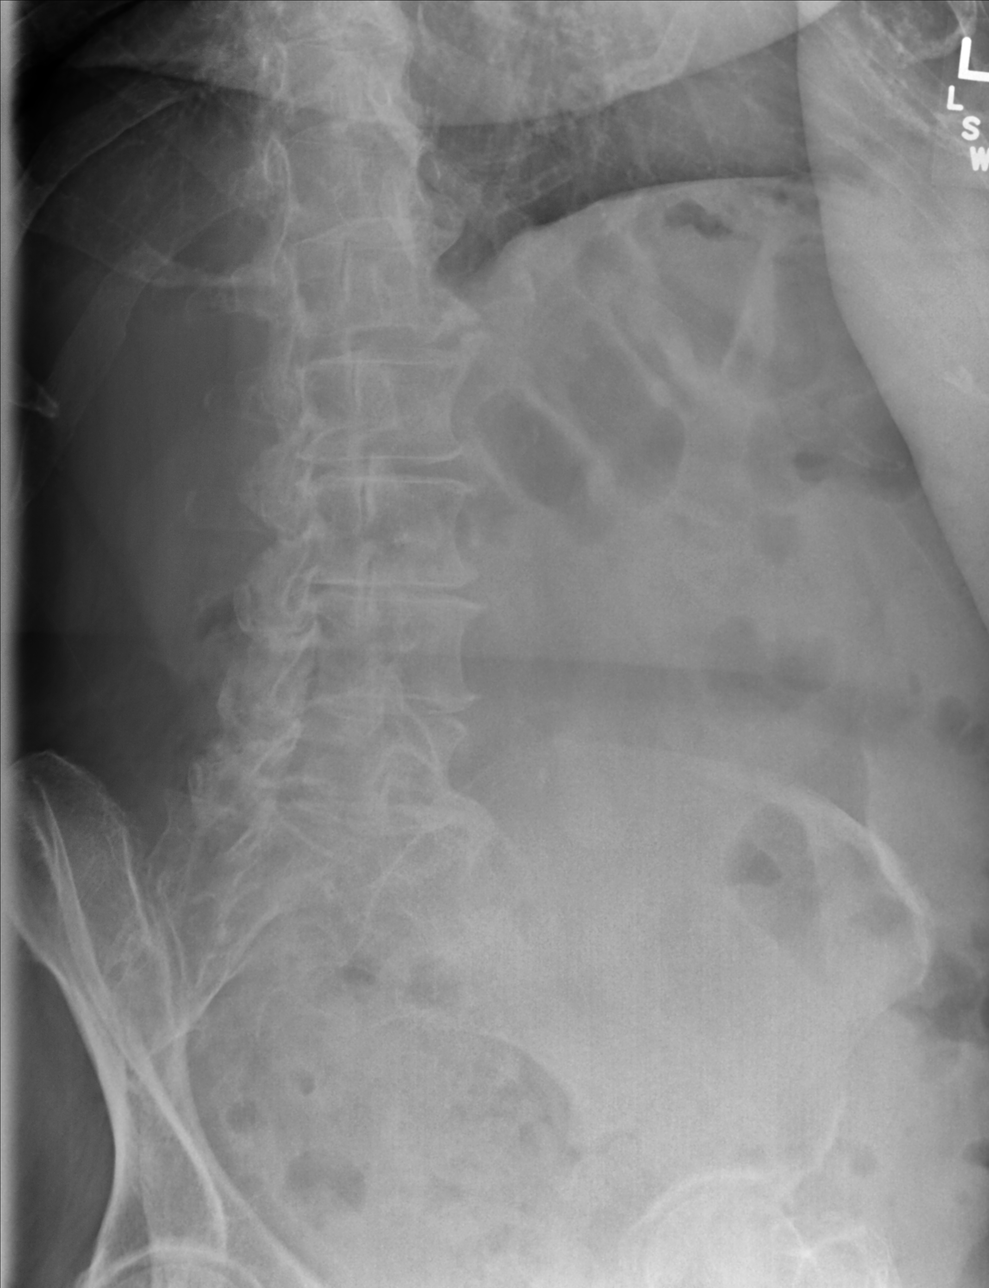

[lumbar spine lat]
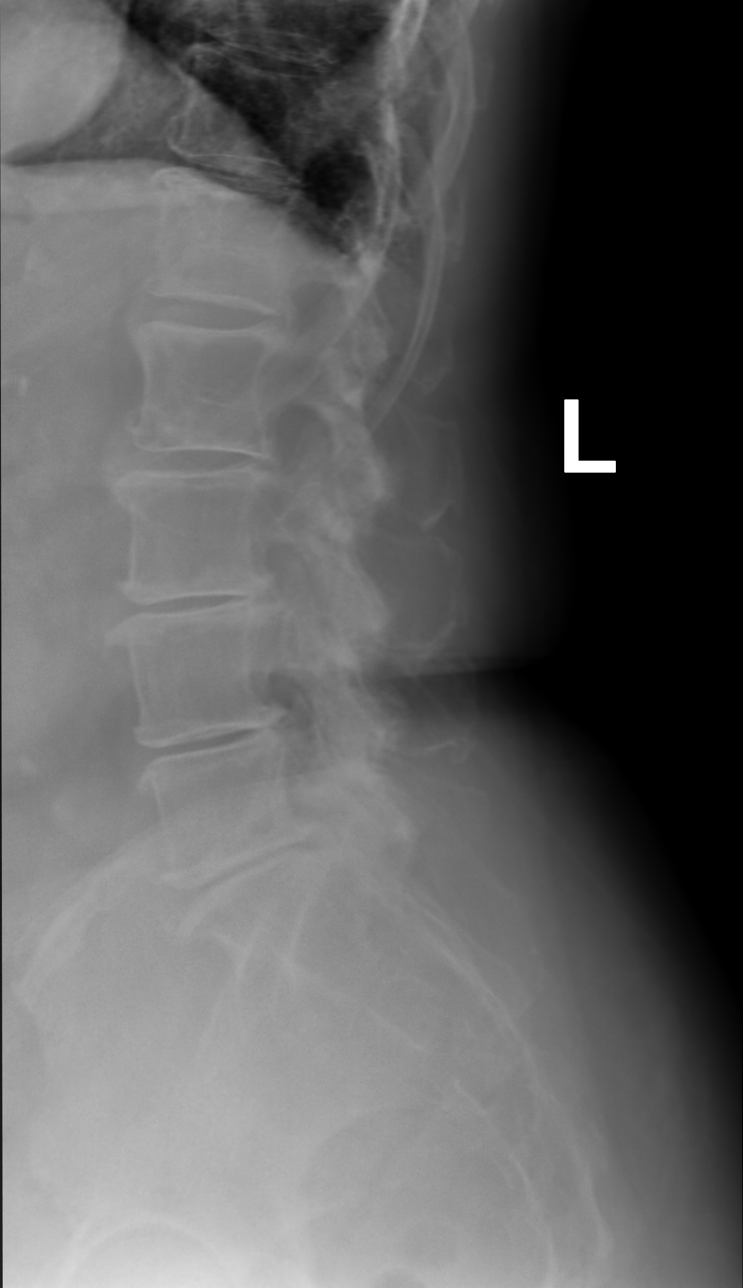

[lumbar spot lat]
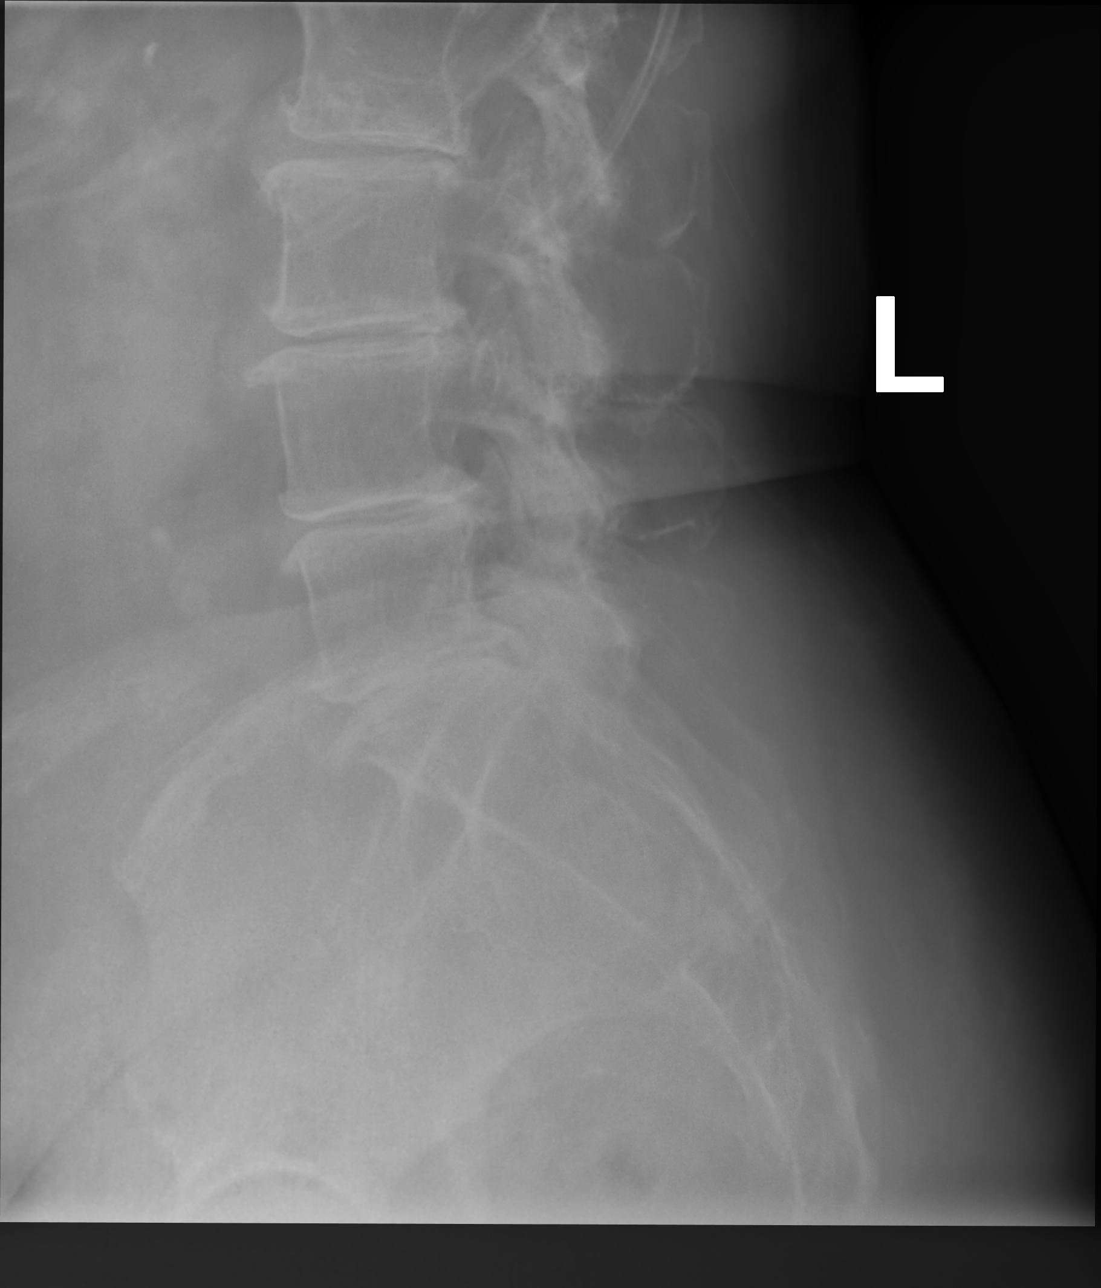

[5 of 5 positions shown; findings below may reference images not displayed]

FINDINGS: Five lumbar type vertebral bodies show normal alignment. Disc space
narrowing throughout the lumbar region. Lower lumbar facet
arthropathy. Findings could certainly be a cause of back, neural
compression referred facet syndrome.
IMPRESSION: Lumbar degenerative disc disease and degenerative facet disease
which could be symptomatic.

## 2019-12-16 ENCOUNTER — Telehealth: Payer: Self-pay | Admitting: Internal Medicine

## 2019-12-16 ENCOUNTER — Encounter: Payer: Self-pay | Admitting: Internal Medicine

## 2019-12-16 NOTE — Telephone Encounter (Signed)
Called and spoke with the Patient. She states that she had a hard time for about 5 weeks. Over all not feeling well and SOB. Patient had chest x-rays done and they were negative for an onset of pneumonia.   Patient states she is now feeling much better. Declines having any residual symptoms of COVID and she is no longer SOB. Patient informed to call back in should she start feeling unwell again. She thank you for the call

## 2019-12-16 NOTE — Telephone Encounter (Signed)
Call pt and see how doing after covid +10/13/19 I did not know until today

## 2019-12-17 DIAGNOSIS — R69 Illness, unspecified: Secondary | ICD-10-CM | POA: Diagnosis not present

## 2019-12-22 ENCOUNTER — Other Ambulatory Visit: Payer: Self-pay

## 2019-12-22 ENCOUNTER — Telehealth: Payer: Self-pay

## 2019-12-22 NOTE — Telephone Encounter (Signed)
Pt called and stated that she is having sudden onset of lower back flank pain-. I transferred to [facility] to Triage

## 2019-12-22 NOTE — Telephone Encounter (Signed)
Patient was instructed to reach out to call her urologist per PCP and also if unable to go to reach or be seen by them she was instructed to go to UC/ED to be evaluated. She stated she would comply with directions.

## 2019-12-22 NOTE — Telephone Encounter (Signed)
Agree Dr. Olivia Mackie Mclean-Scocuzza

## 2019-12-23 ENCOUNTER — Other Ambulatory Visit: Payer: Self-pay | Admitting: Internal Medicine

## 2019-12-23 DIAGNOSIS — M5416 Radiculopathy, lumbar region: Secondary | ICD-10-CM

## 2019-12-23 DIAGNOSIS — M25552 Pain in left hip: Secondary | ICD-10-CM

## 2019-12-23 DIAGNOSIS — M5126 Other intervertebral disc displacement, lumbar region: Secondary | ICD-10-CM

## 2019-12-23 DIAGNOSIS — M109 Gout, unspecified: Secondary | ICD-10-CM

## 2019-12-23 DIAGNOSIS — N390 Urinary tract infection, site not specified: Secondary | ICD-10-CM | POA: Diagnosis not present

## 2019-12-23 DIAGNOSIS — M25551 Pain in right hip: Secondary | ICD-10-CM

## 2019-12-23 DIAGNOSIS — E785 Hyperlipidemia, unspecified: Secondary | ICD-10-CM

## 2019-12-23 DIAGNOSIS — I10 Essential (primary) hypertension: Secondary | ICD-10-CM

## 2019-12-23 DIAGNOSIS — R937 Abnormal findings on diagnostic imaging of other parts of musculoskeletal system: Secondary | ICD-10-CM

## 2019-12-23 MED ORDER — TRAMADOL HCL 50 MG PO TABS: 100.0000 mg | ORAL_TABLET | Freq: Three times a day (TID) | ORAL | 2 refills | Status: DC | PRN

## 2019-12-23 MED ORDER — ATORVASTATIN CALCIUM 40 MG PO TABS: 40.0000 mg | ORAL_TABLET | Freq: Every day | ORAL | 3 refills | Status: DC

## 2019-12-23 MED ORDER — AMLODIPINE BESYLATE 2.5 MG PO TABS: 2.5000 mg | ORAL_TABLET | Freq: Every day | ORAL | 11 refills | Status: DC

## 2019-12-23 MED ORDER — ALLOPURINOL 300 MG PO TABS: 300.0000 mg | ORAL_TABLET | Freq: Every day | ORAL | 3 refills | Status: DC

## 2020-01-01 DIAGNOSIS — N23 Unspecified renal colic: Secondary | ICD-10-CM | POA: Diagnosis not present

## 2020-01-01 DIAGNOSIS — N390 Urinary tract infection, site not specified: Secondary | ICD-10-CM | POA: Diagnosis not present

## 2020-01-05 ENCOUNTER — Other Ambulatory Visit: Payer: Self-pay | Admitting: Urology

## 2020-01-05 DIAGNOSIS — N200 Calculus of kidney: Secondary | ICD-10-CM

## 2020-01-05 DIAGNOSIS — R109 Unspecified abdominal pain: Secondary | ICD-10-CM

## 2020-01-05 DIAGNOSIS — R10A0 Flank pain, unspecified side: Secondary | ICD-10-CM

## 2020-01-07 ENCOUNTER — Ambulatory Visit (INDEPENDENT_AMBULATORY_CARE_PROVIDER_SITE_OTHER): Payer: Medicare HMO

## 2020-01-07 ENCOUNTER — Ambulatory Visit: Payer: Medicare HMO | Admitting: Nurse Practitioner

## 2020-01-07 VITALS — Ht 65.0 in | Wt 220.0 lb

## 2020-01-07 DIAGNOSIS — Z0000 Encounter for general adult medical examination without abnormal findings: Secondary | ICD-10-CM

## 2020-01-07 NOTE — Patient Instructions (Addendum)
Cynthia Bryan , Thank you for taking time to come for your Medicare Wellness Visit. I appreciate your ongoing commitment to your health goals. Please review the following plan we discussed and let me know if I can assist you in the future.   These are the goals we discussed: Goals      Patient Stated   .  Increase physical activity (pt-stated)      I want to be able to walk again and lose weight        This is a list of the screening recommended for you and due dates:  Health Maintenance  Topic Date Due  . Mammogram  11/02/2019  . Cologuard (Stool DNA test)  09/29/2022  . Tetanus Vaccine  10/07/2028  . Flu Shot  Completed  . DEXA scan (bone density measurement)  Completed  . COVID-19 Vaccine  Completed  .  Hepatitis C: One time screening is recommended by Center for Disease Control  (CDC) for  adults born from [birth year] through 1963.   Completed  . Pneumonia vaccines  Completed    Immunizations Immunization History  Administered Date(s) Administered  . Hepatitis A 08/25/2003, 03/19/2017  . Hepatitis A, Adult 08/25/2003, 03/19/2017  . Hepatitis B 03/19/2017, 04/22/2017  . Hepatitis B, adult 03/19/2017, 04/22/2017  . Influenza, High Dose Seasonal PF 12/07/2014, 01/10/2017, 11/18/2018, 12/17/2019  . Influenza,inj,Quad PF,6+ Mos 12/30/2013, 04/09/2016  . Influenza,inj,quad, With Preservative 12/04/2014  . Influenza-Unspecified 03/17/2012, 12/30/2013, 04/09/2016, 10/15/2017  . PFIZER SARS-COV-2 Vaccination 05/28/2019, 06/23/2019  . Pneumococcal Conjugate-13 05/11/2013, 10/08/2018  . Pneumococcal Polysaccharide-23 04/09/2016  . Pneumococcal-Unspecified 03/05/2014  . Td 09/17/2003  . Tdap 05/11/2013, 10/08/2018  . Zoster 05/16/2002  . Zoster Recombinat (Shingrix) 08/30/2019   Follow up today 01/17/20 @ 1:30  Follow up 02/17/20 @ 9:00  Conditions/risks identified: back pain worsening and doesn't feel good overall. Scheduled to see physician today @ 1:30.   Appointment: Follow up  in one year for your annual wellness visit.   Preventive Care 56 Years and Older, Female Preventive care refers to lifestyle choices and visits with your health care provider that can promote health and wellness. What does preventive care include?  A yearly physical exam. This is also called an annual well check.  Dental exams once or twice a year.  Routine eye exams. Ask your health care provider how often you should have your eyes checked.  Personal lifestyle choices, including:  Daily care of your teeth and gums.  Regular physical activity.  Eating a healthy diet.  Avoiding tobacco and drug use.  Limiting alcohol use.  Practicing safe sex.  Taking low-dose aspirin every day.  Taking vitamin and mineral supplements as recommended by your health care provider. What happens during an annual well check? The services and screenings done by your health care provider during your annual well check will depend on your age, overall health, lifestyle risk factors, and family history of disease. Counseling  Your health care provider may ask you questions about your:  Alcohol use.  Tobacco use.  Drug use.  Emotional well-being.  Home and relationship well-being.  Sexual activity.  Eating habits.  History of falls.  Memory and ability to understand (cognition).  Work and work Statistician.  Reproductive health. Screening  You may have the following tests or measurements:  Height, weight, and BMI.  Blood pressure.  Lipid and cholesterol levels. These may be checked every 5 years, or more frequently if you are over 12 years old.  Skin check.  Lung  cancer screening. You may have this screening every year starting at age 46 if you have a 30-pack-year history of smoking and currently smoke or have quit within the past 15 years.  Fecal occult blood test (FOBT) of the stool. You may have this test every year starting at age 42.  Flexible sigmoidoscopy or  colonoscopy. You may have a sigmoidoscopy every 5 years or a colonoscopy every 10 years starting at age 21.  Hepatitis C blood test.  Hepatitis B blood test.  Sexually transmitted disease (STD) testing.  Diabetes screening. This is done by checking your blood sugar (glucose) after you have not eaten for a while (fasting). You may have this done every 1-3 years.  Bone density scan. This is done to screen for osteoporosis. You may have this done starting at age 67.  Mammogram. This may be done every 1-2 years. Talk to your health care provider about how often you should have regular mammograms. Talk with your health care provider about your test results, treatment options, and if necessary, the need for more tests. Vaccines  Your health care provider may recommend certain vaccines, such as:  Influenza vaccine. This is recommended every year.  Tetanus, diphtheria, and acellular pertussis (Tdap, Td) vaccine. You may need a Td booster every 10 years.  Zoster vaccine. You may need this after age 59.  Pneumococcal 13-valent conjugate (PCV13) vaccine. One dose is recommended after age 60.  Pneumococcal polysaccharide (PPSV23) vaccine. One dose is recommended after age 81. Talk to your health care provider about which screenings and vaccines you need and how often you need them. This information is not intended to replace advice given to you by your health care provider. Make sure you discuss any questions you have with your health care provider. Document Released: 03/18/2015 Document Revised: 11/09/2015 Document Reviewed: 12/21/2014 Elsevier Interactive Patient Education  2017 [employer] Prevention in the Home Falls can cause injuries. They can happen to people of all ages. There are many things you can do to make your home safe and to help prevent falls. What can I do on the outside of my home?  Regularly fix the edges of walkways and driveways and fix any cracks.  Remove  anything that might make you trip as you walk through a door, such as a raised step or threshold.  Trim any bushes or trees on the path to your home.  Use bright outdoor lighting.  Clear any walking paths of anything that might make someone trip, such as rocks or tools.  Regularly check to see if handrails are loose or broken. Make sure that both sides of any steps have handrails.  Any raised decks and porches should have guardrails on the edges.  Have any leaves, snow, or ice cleared regularly.  Use sand or salt on walking paths during winter.  Clean up any spills in your garage right away. This includes oil or grease spills. What can I do in the bathroom?  Use night lights.  Install grab bars by the toilet and in the tub and shower. Do not use towel bars as grab bars.  Use non-skid mats or decals in the tub or shower.  If you need to sit down in the shower, use a plastic, non-slip stool.  Keep the floor dry. Clean up any water that spills on the floor as soon as it happens.  Remove soap buildup in the tub or shower regularly.  Attach bath mats securely with double-sided non-slip rug  tape.  Do not have throw rugs and other things on the floor that can make you trip. What can I do in the bedroom?  Use night lights.  Make sure that you have a light by your bed that is easy to reach.  Do not use any sheets or blankets that are too big for your bed. They should not hang down onto the floor.  Have a firm chair that has side arms. You can use this for support while you get dressed.  Do not have throw rugs and other things on the floor that can make you trip. What can I do in the kitchen?  Clean up any spills right away.  Avoid walking on wet floors.  Keep items that you use a lot in easy-to-reach places.  If you need to reach something above you, use a strong step stool that has a grab bar.  Keep electrical cords out of the way.  Do not use floor polish or wax that  makes floors slippery. If you must use wax, use non-skid floor wax.  Do not have throw rugs and other things on the floor that can make you trip. What can I do with my stairs?  Do not leave any items on the stairs.  Make sure that there are handrails on both sides of the stairs and use them. Fix handrails that are broken or loose. Make sure that handrails are as long as the stairways.  Check any carpeting to make sure that it is firmly attached to the stairs. Fix any carpet that is loose or worn.  Avoid having throw rugs at the top or bottom of the stairs. If you do have throw rugs, attach them to the floor with carpet tape.  Make sure that you have a light switch at the top of the stairs and the bottom of the stairs. If you do not have them, ask someone to add them for you. What else can I do to help prevent falls?  Wear shoes that:  Do not have high heels.  Have rubber bottoms.  Are comfortable and fit you well.  Are closed at the toe. Do not wear sandals.  If you use a stepladder:  Make sure that it is fully opened. Do not climb a closed stepladder.  Make sure that both sides of the stepladder are locked into place.  Ask someone to hold it for you, if possible.  Clearly mark and make sure that you can see:  Any grab bars or handrails.  First and last steps.  Where the edge of each step is.  Use tools that help you move around (mobility aids) if they are needed. These include:  Canes.  Walkers.  Scooters.  Crutches.  Turn on the lights when you go into a dark area. Replace any light bulbs as soon as they burn out.  Set up your furniture so you have a clear path. Avoid moving your furniture around.  If any of your floors are uneven, fix them.  If there are any pets around you, be aware of where they are.  Review your medicines with your doctor. Some medicines can make you feel dizzy. This can increase your chance of falling. Ask your doctor what other  things that you can do to help prevent falls. This information is not intended to replace advice given to you by your health care provider. Make sure you discuss any questions you have with your health care provider. Document Released: 12/16/2008  Document Revised: 07/28/2015 Document Reviewed: 03/26/2014 Elsevier Interactive Patient Education  2017 [employer].

## 2020-01-07 NOTE — Progress Notes (Signed)
Subjective:   Cynthia Bryan is a 74 y.o. female who presents for Medicare Annual (Subsequent) preventive examination.  Review of Systems    No ROS.  Medicare Wellness Virtual Visit.     Cardiac Risk Factors include: advanced age (>57men, >34 women);hypertension     Objective:    Today's Vitals   01/07/20 0931  Weight: 220 lb (99.8 kg)  Height: 5\' 5"  (1.651 m)   Body mass index is 36.61 kg/m.  Advanced Directives 01/07/2020 01/06/2019 12/31/2017 09/19/2017 07/31/2016  Does Patient Have a Medical Advance Directive? Yes Yes Yes Yes No  Type of Advance Directive - [place];Living will [place];Living will [place];Living will -  Does patient want to make changes to medical advance directive? No - Patient declined No - Patient declined No - Patient declined No - Patient declined -  Copy of [place] in Chart? - No - copy requested No - copy requested No - copy requested -  Would patient like information on creating a medical advance directive? - - - - No - Patient declined    Current Medications (verified) Outpatient Encounter Medications as of 01/07/2020  Medication Sig  . acetaminophen (TYLENOL) 500 MG tablet Take 1,000 mg by mouth 2 (two) times daily.   Marland Kitchen allopurinol (ZYLOPRIM) 300 MG tablet Take 1 tablet (300 mg total) by mouth daily.  Marland Kitchen amLODipine (NORVASC) 2.5 MG tablet Take 1-2 tablets (2.5-5 mg total) by mouth daily. Start with 2.5 mg dose if BP >130/>80 take 2 pills =5 mg  . atorvastatin (LIPITOR) 40 MG tablet Take 1 tablet (40 mg total) by mouth daily at 6 PM.  . Cholecalciferol (VITAMIN D3) 5000 units CAPS Take by mouth daily.  . Cyanocobalamin (B-12) 5000 MCG CAPS Take by mouth daily.  Marland Kitchen gabapentin (NEURONTIN) 300 MG capsule Take 1 capsule (300 mg total) by mouth at bedtime.  . Magnesium 500 MG CAPS Take 1 tablet by mouth at bedtime.  . methocarbamol (ROBAXIN) 750 MG tablet Take 1 tablet (750  mg total) by mouth at bedtime as needed for muscle spasms.  . pantoprazole (PROTONIX) 40 MG tablet Take 1 tablet (40 mg total) by mouth daily.  . potassium chloride (MICRO-K) 10 MEQ CR capsule Take 1 capsule (10 mEq total) by mouth 2 (two) times daily.  . traMADol (ULTRAM) 50 MG tablet Take 2 tablets (100 mg total) by mouth 3 (three) times daily as needed.  . triamterene-hydrochlorothiazide (MAXZIDE-25) 37.5-25 MG tablet Take 1 tablet by mouth daily.   No facility-administered encounter medications on file as of 01/07/2020.    Allergies (verified) Nortriptyline and Sulfa antibiotics   History: Past Medical History:  Diagnosis Date  . Arthritis   . Basal cell carcinoma    right temple Dr. Elmer Ramp removed 01/2017   . Chicken pox   . COVID-19    10/13/19  . Emphysema of lung ([place])    h/o bronchitis not emphysema  . Fatty liver    09/19/15  . Gastric ulcer    neg H pylori   . GERD (gastroesophageal reflux disease)   . Gout   . Hiatal hernia   . Hyperlipidemia   . Hypertension   . Kidney stone    noted CT ab/pelvis 09/19/15 left kidney   . Kidney stone    Dr. Rogers Blocker 2019   . Lumbar herniated disc   . Recurrent UTI   . Syncope    per pt presyncope no  LOC felt dizzy/vision distorted and couldnt speak EEG neg, CT head neg 07/31/16, f/u [facility] cardiology with US carotid, echo but no Holter   . Umbilical hernia    noted 09/19/15   . UTI (urinary tract infection)    Past Surgical History:  Procedure Laterality Date  . BREAST BIOPSY Right 1980   cyst removed negative pathology   . EXTRACORPOREAL SHOCK WAVE LITHOTRIPSY Left 09/19/2017   Procedure: EXTRACORPOREAL SHOCK WAVE LITHOTRIPSY (ESWL);  Surgeon: Royston Cowper, MD;  Location: [facility] ORS;  Service: Urology;  Laterality: Left;  . JOINT REPLACEMENT     right knee 11/2015 Dr. Janell Quiet [facility]   Family History  Problem Relation Age of Onset  . Breast cancer Mother 33  . Cancer Mother        breast cancer s/p masectomy  lived to be 53   . Hypertension Mother   . Cancer Father        NMSC skin cancer   . Gout Father   . Heart disease Father        s/p bypass surgery age 9 lived 5 months after surgery   . Kidney disease Father        RAS>kidney failure   . Hyperlipidemia Other   . Stroke Other   . Kidney cancer Neg Hx   . Bladder Cancer Neg Hx   . Prolactinoma Neg Hx    Social History   Socioeconomic History  . Marital status: Widowed    Spouse name: Not on file  . Number of children: Not on file  . Years of education: Not on file  . Highest education level: Not on file  Occupational History  . Not on file  Tobacco Use  . Smoking status: Former Research scientist (life sciences)  . Smokeless tobacco: Never Used  . Tobacco comment: smoked total 30 years quit 20 years ago from 2019 max 1 ppd no FH lung cancer   Vaping Use  . Vaping Use: Never used  Substance and Sexual Activity  . Alcohol use: No  . Drug use: Never  . Sexual activity: Not on file  Other Topics Concern  . Not on file  Social History Narrative   2 years college    Retired Customer service manager    Social Determinants of Radio broadcast assistant Strain: [place]   . Difficulty of Paying Living Expenses: Not hard at all  Food Insecurity: No Food Insecurity  . Worried About Charity fundraiser in the Last Year: Never true  . Ran Out of Food in the Last Year: Never true  Transportation Needs: No Transportation Needs  . Lack of Transportation (Medical): No  . Lack of Transportation (Non-Medical): No  Physical Activity: Unknown  . Days of Exercise per Week: 0 days  . Minutes of Exercise per Session: Not on file  Stress: No Stress Concern Present  . Feeling of Stress : Not at all  Social Connections: Unknown  . Frequency of Communication with Friends and Family: More than three times a week  . Frequency of Social Gatherings with Friends and Family: More than three times a week  . Attends Religious Services: More than 4 times per year  . Active Member of  Clubs or Organizations: Yes  . Attends Archivist Meetings: More than 4 times per year  . Marital Status: Not on file    Tobacco Counseling Counseling given: Not Answered Comment: smoked total 30 years quit 20 years ago from 2019 max 1  ppd no FH lung cancer    Clinical Intake:  Pre-visit preparation completed: Yes        Diabetes: No  How often do you need to have someone help you when you read instructions, pamphlets, or other written materials from your doctor or pharmacy?: 1 - Never   Interpreter Needed?: No      Activities of Daily Living In your present state of health, do you have any difficulty performing the following activities: 01/07/2020  Hearing? N  Vision? N  Difficulty concentrating or making decisions? N  Walking or climbing stairs? Y  Comment Chronic back pain  Dressing or bathing? N  Doing errands, shopping? N  Preparing Food and eating ? N  Using the Toilet? N  In the past six months, have you accidently leaked urine? N  Do you have problems with loss of bowel control? N  Managing your Medications? N  Managing your Finances? N  Housekeeping or managing your Housekeeping? Y  Comment Maid assist  Some recent data might be hidden    Patient Care Team: McLean-Scocuzza, Nino Glow, MD as PCP - General (Internal Medicine)  Indicate any recent Medical Services you may have received from other than [facility] providers in the past year (date may be approximate).     Assessment:   This is a routine wellness examination for [place].  I connected with [place] today by telephone and verified that I am speaking with the correct person using two identifiers. Location patient: home Location provider: work Persons participating in the virtual visit: patient, Marine scientist.    I discussed the limitations, risks, security and privacy concerns of performing an evaluation and management service by telephone and the availability of in person appointments. The patient  expressed understanding and verbally consented to this telephonic visit.    Interactive audio and video telecommunications were attempted between this provider and patient, however failed, due to patient having technical difficulties OR patient did not have access to video capability.  We continued and completed visit with audio only.  Some vital signs may be absent or patient reported.   Hearing/Vision screen  Hearing Screening   125Hz  250Hz  500Hz  1000Hz  2000Hz  3000Hz  4000Hz  6000Hz  8000Hz   Right ear:           Left ear:           Comments: Patient is able to hear conversational tones without difficulty.  No issues reported.  Vision Screening Comments: Followed by Dr. Ellin Mayhew  Wears corrective lenses  They have regular follow up with the ophthalmologist.  Dietary issues and exercise activities discussed: Current Exercise Habits: The patient does not participate in regular exercise at present  Regular diet Good water intake  Goals      Patient Stated   .  Increase physical activity (pt-stated)      I want to be able to walk again and lose weight       Depression Screen PHQ 2/9 Scores 01/07/2020 09/16/2019 01/06/2019 01/02/2019 12/31/2017 07/03/2017  PHQ - 2 Score 0 0 0 0 0 0    Fall Risk Fall Risk  01/07/2020 10/26/2019 09/16/2019 01/06/2019 01/02/2019  Falls in the past year? 0 0 0 0 0  Number falls in past yr: 0 - 0 - -  Injury with Fall? - - 0 - -  Follow up Falls evaluation completed Falls evaluation completed Falls evaluation completed - -   Handrails in use when climbing stairs? Yes Home free of loose throw rugs in walkways, pet  beds, electrical cords, etc? Yes  Adequate lighting in your home to reduce risk of falls? Yes   ASSISTIVE DEVICES UTILIZED TO PREVENT FALLS: Use of a cane, walker or w/c? No  TIMED UP AND GO: Was the test performed? No . Virtual visit.   Cognitive Function: MMSE - Mini Mental State Exam 12/31/2017  Orientation to time 5  Orientation to Place  5  Registration 3  Attention/ Calculation 5  Recall 3  Language- name 2 objects 2  Language- repeat 1  Language- follow 3 step command 3  Language- read & follow direction 1  Write a sentence 1  Copy design 1  Total score 30     6CIT Screen 01/06/2019  What Year? 0 points  What month? 0 points  What time? 0 points  Count back from 20 0 points  Months in reverse 0 points    Immunizations Immunization History  Administered Date(s) Administered  . Hepatitis A 08/25/2003, 03/19/2017  . Hepatitis A, Adult 08/25/2003, 03/19/2017  . Hepatitis B 03/19/2017, 04/22/2017  . Hepatitis B, adult 03/19/2017, 04/22/2017  . Influenza, High Dose Seasonal PF 12/07/2014, 01/10/2017, 11/18/2018, 12/17/2019  . Influenza,inj,Quad PF,6+ Mos 12/30/2013, 04/09/2016  . Influenza,inj,quad, With Preservative 12/04/2014  . Influenza-Unspecified 03/17/2012, 12/30/2013, 04/09/2016, 10/15/2017  . PFIZER SARS-COV-2 Vaccination 05/28/2019, 06/23/2019  . Pneumococcal Conjugate-13 05/11/2013, 10/08/2018  . Pneumococcal Polysaccharide-23 04/09/2016  . Pneumococcal-Unspecified 03/05/2014  . Td 09/17/2003  . Tdap 05/11/2013, 10/08/2018  . Zoster 05/16/2002  . Zoster Recombinat (Shingrix) 08/30/2019   Health Maintenance Health Maintenance  Topic Date Due  . MAMMOGRAM  11/02/2019  . Fecal DNA (Cologuard)  09/29/2022  . TETANUS/TDAP  10/07/2028  . INFLUENZA VACCINE  Completed  . DEXA SCAN  Completed  . COVID-19 Vaccine  Completed  . Hepatitis C Screening  Completed  . PNA vac Low Risk Adult  Completed   Cologuard- last completed 09/29/19. Positive. Patient verbalizes understanding the need for colonoscopy. Deferred for follow up with pcp per patient preference.   Mammogram- Last completed 11/01/17. Bilateral, TOMO and CAD. Ordered 09/16/19. Plans to schedule.   Bone density- Last completed 04/03/12. Osteopenia. Ordered 09/16/19. Plans to schedule.   Lung Cancer Screening: Last completed 10/26/19.    Hepatitis C Screening: Completed 04/19/16.  Vision Screening: Recommended annual ophthalmology exams for early detection of glaucoma and other disorders of the eye. Is the patient up to date with their annual eye exam?  Yes  Who is the provider or what is the name of the office in which the patient attends annual eye exams? Dr. Orion Modest.   Dental Screening: Recommended annual dental exams for proper oral hygiene. Visits every 6 months.   Community Resource Referral / Chronic Care Management: CRR required this visit?  No   CCM required this visit?  No      Plan:   Keep all routine maintenance appointments.   Follow up today 01/17/20 @ 1:30  Follow up 02/17/20 @ 9:00  I have personally reviewed and noted the following in the patient's chart:   . Medical and social history . Use of alcohol, tobacco or illicit drugs  . Current medications and supplements . Functional ability and status . Nutritional status . Physical activity . Advanced directives . List of other physicians . Hospitalizations, surgeries, and ER visits in previous 12 months . Vitals . Screenings to include cognitive, depression, and falls . Referrals and appointments  In addition, I have reviewed and discussed with patient certain preventive protocols, quality metrics, and  best practice recommendations. A written personalized care plan for preventive services as well as general preventive health recommendations were provided to patient via mychart.     Varney Biles, LPN   [id number]

## 2020-01-15 ENCOUNTER — Ambulatory Visit
Admission: RE | Admit: 2020-01-15 | Discharge: 2020-01-15 | Disposition: A | Discharge status: Home / Self Care | Payer: Medicare HMO | Source: Ambulatory Visit | Attending: Urology | Admitting: Urology

## 2020-01-15 ENCOUNTER — Other Ambulatory Visit: Payer: Self-pay

## 2020-01-15 DIAGNOSIS — N281 Cyst of kidney, acquired: Secondary | ICD-10-CM | POA: Diagnosis not present

## 2020-01-15 DIAGNOSIS — R109 Unspecified abdominal pain: Secondary | ICD-10-CM | POA: Insufficient documentation

## 2020-01-15 DIAGNOSIS — R10A0 Flank pain, unspecified side: Secondary | ICD-10-CM

## 2020-01-15 DIAGNOSIS — N200 Calculus of kidney: Secondary | ICD-10-CM | POA: Diagnosis present

## 2020-01-15 DIAGNOSIS — R112 Nausea with vomiting, unspecified: Secondary | ICD-10-CM | POA: Diagnosis not present

## 2020-01-15 DIAGNOSIS — R509 Fever, unspecified: Secondary | ICD-10-CM | POA: Diagnosis not present

## 2020-01-15 DIAGNOSIS — N2889 Other specified disorders of kidney and ureter: Secondary | ICD-10-CM | POA: Diagnosis not present

## 2020-01-15 LAB — POCT I-STAT CREATININE: Creatinine, Ser: 1 mg/dL (ref 0.44–1.00)

## 2020-01-15 MED ORDER — IOHEXOL 300 MG/ML  SOLN
100.0000 mL | Freq: Once | INTRAMUSCULAR | Status: AC | PRN
  Administered 2020-01-15: 100 mL via INTRAVENOUS

## 2020-01-18 DIAGNOSIS — N10 Acute tubulo-interstitial nephritis: Secondary | ICD-10-CM | POA: Diagnosis not present

## 2020-01-18 DIAGNOSIS — N23 Unspecified renal colic: Secondary | ICD-10-CM | POA: Diagnosis not present

## 2020-02-09 ENCOUNTER — Other Ambulatory Visit: Payer: Self-pay

## 2020-02-09 ENCOUNTER — Telehealth: Payer: Self-pay | Admitting: Internal Medicine

## 2020-02-09 ENCOUNTER — Encounter
Admission: RE | Admit: 2020-02-09 | Discharge: 2020-02-09 | Disposition: A | Discharge status: Home / Self Care | Payer: Medicare HMO | Source: Ambulatory Visit | Attending: Urology | Admitting: Urology

## 2020-02-09 DIAGNOSIS — K219 Gastro-esophageal reflux disease without esophagitis: Secondary | ICD-10-CM

## 2020-02-09 DIAGNOSIS — E876 Hypokalemia: Secondary | ICD-10-CM

## 2020-02-09 HISTORY — DX: Personal history of urinary calculi: Z87.442

## 2020-02-09 MED ORDER — PANTOPRAZOLE SODIUM 40 MG PO TBEC: 40.0000 mg | DELAYED_RELEASE_TABLET | Freq: Every day | ORAL | 3 refills | Status: DC

## 2020-02-09 MED ORDER — POTASSIUM CHLORIDE ER 10 MEQ PO CPCR: 10.0000 meq | ORAL_CAPSULE | Freq: Two times a day (BID) | ORAL | 1 refills | Status: DC

## 2020-02-09 NOTE — Telephone Encounter (Signed)
Patient called in for refill for pantoprazole (PROTONIX) 40 MG tablet

## 2020-02-09 NOTE — Patient Instructions (Addendum)
Your procedure is scheduled on:02-18-20 THURSDAY Report to the Registration Desk on the 1st floor of the [facility]-Then proceed to the 2nd floor Surgery Desk in the [place] To find out your arrival time, please call [phone] between 1PM - 3PM on:02-17-20 WEDNESDAY  REMEMBER: Instructions that are not followed completely may result in serious medical risk, up to and including death; or upon the discretion of your surgeon and anesthesiologist your surgery may need to be rescheduled.  Do not eat food after midnight the night before surgery.  No gum chewing, lozengers or hard candies.  You may however, drink CLEAR liquids up to 2 hours before you are scheduled to arrive for your surgery. Do not drink anything within 2 hours of your scheduled arrival time.  Clear liquids include: - water  - apple juice without pulp - gatorade (not RED, PURPLE, OR BLUE) - black coffee or tea (Do NOT add milk or creamers to the coffee or tea) Do NOT drink anything that is not on this list.  TAKE THESE MEDICATIONS THE MORNING OF SURGERY WITH A SIP OF WATER: -ALLOPURINOL (ZYLOPRIM) -PROTONIX (PANTOPRAZOLE) -NORVASC (AMLODIPINE) -TRAMADOL (ULTRAM)  One week prior to surgery: Stop Anti-inflammatories (NSAIDS) such as Advil, Aleve, Ibuprofen, Motrin, Naproxen, Naprosyn and Aspirin based products such as Excedrin, Goodys Powder, BC Powder-OK TO TAKE TYLENOL AND TRAMADOL IF NEEDED  Stop ANY OVER THE COUNTER supplements until after surgery. (However, you may continue taking Vitamin D. Vitamin B 12, Magnesium and Potassium up until the day before surgery.)  No Alcohol for 24 hours before or after surgery.  No Smoking including e-cigarettes for 24 hours prior to surgery.  No chewable tobacco products for at least 6 hours prior to surgery.  No nicotine patches on the day of surgery.  Do not use any "recreational" drugs for at least a week prior to your surgery.  Please be advised that the  combination of cocaine and anesthesia may have negative outcomes, up to and including death. If you test positive for cocaine, your surgery will be cancelled.  On the morning of surgery brush your teeth with toothpaste and water, you may rinse your mouth with mouthwash if you wish. Do not swallow any toothpaste or mouthwash.  Do not wear jewelry, make-up, hairpins, clips or nail polish.  Do not wear lotions, powders, or perfumes.   Do not shave body from the neck down 48 hours prior to surgery just in case you cut yourself which could leave a site for infection.  Also, freshly shaved skin may become irritated if using the CHG soap.  Contact lenses, hearing aids and dentures may not be worn into surgery.  Do not bring valuables to the hospital. [facility] is not responsible for any missing/lost belongings or valuables.    Notify your doctor if there is any change in your medical condition (cold, fever, infection).  Wear comfortable clothing (specific to your surgery type) to the hospital.  Plan for stool softeners for home use; pain medications have a tendency to cause constipation. You can also help prevent constipation by eating foods high in fiber such as fruits and vegetables and drinking plenty of fluids as your diet allows.  After surgery, you can help prevent lung complications by doing breathing exercises.  Take deep breaths and cough every 1-2 hours. Your doctor may order a device called an Incentive Spirometer to help you take deep breaths. When coughing or sneezing, hold a pillow firmly against your incision with both hands. This  is called "splinting." Doing this helps protect your incision. It also decreases belly discomfort.  If you are being admitted to the hospital overnight, leave your suitcase in the car. After surgery it may be brought to your room.  If you are being discharged the day of surgery, you will not be allowed to drive home. You will need a responsible  adult (18 years or older) to drive you home and stay with you that night.   If you are taking public transportation, you will need to have a responsible adult (18 years or older) with you. Please confirm with your physician that it is acceptable to use public transportation.   Please call the [facility] at [phone] if you have any questions about these instructions.  Visitation Policy:  Patients undergoing a surgery or procedure may have one family member or support person with them as long as that person is not COVID-19 positive or experiencing its symptoms.  That person may remain in the waiting area during the procedure.  Inpatient Visitation Update:   In an effort to ensure the safety of our team members and our patients, we are implementing a change to our visitation policy:  Effective Monday, Aug. 9, at 7 a.m., inpatients will be allowed one support person.  o The support person may change daily.  o The support person must pass our screening, gel in and out, and wear a mask at all times, including in the patient's room.  o Patients must also wear a mask when staff or their support person are in the room.  o Masking is required regardless of vaccination status.  Systemwide, no visitors 17 or younger.

## 2020-02-10 NOTE — H&P (Addendum)
NAME: Cynthia Bryan, KEHRES MEDICAL RECORD LN:[id number] ACCOUNT [id number] DATE OF BIRTH:December 03, 1945 FACILITY: [facility] LOCATION: [facility] PHYSICIAN:Carnelius Hammitt Farrel Conners, MD  HISTORY AND PHYSICAL  DATE OF ADMISSION:  02/18/2020  CHIEF COMPLAINT:  Left flank pain.  HISTORY OF PRESENT ILLNESS:  The patient is a 74 year old Caucasian female who presented with left flank pain associated with nausea and vomiting.  She also had a positive urine culture at that time, treated with a course of Levaquin.  Followup with a CT  scan on 11/12 indicated no evidence of kidney stones, but abnormal left renal collecting system, and the radiologist recommended retrograde and ureteroscopy.  She had a negative urine culture and cytology on 11/15.  She comes in now for cystoscopy with left  retrograde and left ureteroscopy.  ALLERGIES:  PATIENT WAS ALLERGIC TO SULFA DRUGS.  CURRENT MEDICATIONS:  Included triamterene, hydrochlorothiazide, allopurinol, potassium, and atorvastatin.  PAST SURGICAL HISTORY: 1.  Removal of a right breast cyst in 1980. 2.  ESWL in 2017. 3.  Right knee replacement in 2016.  PAST AND CURRENT MEDICAL CONDITIONS: 1.  Hypertension. 2.  Hypercholesterolemia. 3.  GERD. 4.  Gout. 5.  Kidney stones.  REVIEW OF SYSTEMS:  The patient denied heart disease, stroke, or diabetes.  She does have allergic rhinitis, decreased visual acuity, and chronic joint pain.  SOCIAL HISTORY:  The patient quit smoking in 1997 with a 20-pack-year history.  She denied alcohol use.  FAMILY HISTORY:  Father died at age 36 of heart disease.  Mother died at age 82 of a stroke.  Brother died at age 25 of heart disease.  PHYSICAL EXAMINATION: VITAL SIGNS:  Height was 5 feet 6 inches, weight 214 pounds, BMI 34. GENERAL:  Obese white female in no distress. HEENT:  Sclerae were clear.  Pupils were equally round, reactive to light and accommodation.  Extraocular movements were intact. NECK:  No palpable thyroid  nodules. PULMONARY:  Lungs were clear to auscultation. CARDIOVASCULAR:  Regular rhythm and rate without audible murmurs. ABDOMEN:  Soft, nontender abdomen. LYMPHATIC:  No palpable cervical or inguinal adenopathy. NEUROMUSCULAR:  Alert and oriented x3.  IMPRESSION: 1.  Recent urinary tract infection. 2.  Abnormal CT scan on 11/12 with abnormal appearance of the left renal collecting system.  PLAN:  Cystoscopy with left retrograde and possible left ureteroscopy.  IN/NUANCE  D:02/09/2020 T:02/09/2020 JOB:[id number]

## 2020-02-11 ENCOUNTER — Other Ambulatory Visit: Payer: Self-pay

## 2020-02-11 ENCOUNTER — Encounter
Admission: RE | Admit: 2020-02-11 | Discharge: 2020-02-11 | Disposition: A | Discharge status: Home / Self Care | Payer: Medicare HMO | Source: Ambulatory Visit | Attending: Urology | Admitting: Urology

## 2020-02-11 DIAGNOSIS — I452 Bifascicular block: Secondary | ICD-10-CM | POA: Insufficient documentation

## 2020-02-11 DIAGNOSIS — I10 Essential (primary) hypertension: Secondary | ICD-10-CM | POA: Insufficient documentation

## 2020-02-11 DIAGNOSIS — Z01818 Encounter for other preprocedural examination: Secondary | ICD-10-CM | POA: Insufficient documentation

## 2020-02-11 DIAGNOSIS — I4510 Unspecified right bundle-branch block: Secondary | ICD-10-CM | POA: Diagnosis not present

## 2020-02-11 LAB — POTASSIUM: Potassium: 3.4 mmol/L — ABNORMAL LOW (ref 3.5–5.1)

## 2020-02-16 ENCOUNTER — Other Ambulatory Visit: Payer: Self-pay

## 2020-02-16 ENCOUNTER — Other Ambulatory Visit
Admission: RE | Admit: 2020-02-16 | Discharge: 2020-02-16 | Disposition: A | Discharge status: Home / Self Care | Payer: Medicare HMO | Source: Ambulatory Visit | Attending: Urology | Admitting: Urology

## 2020-02-16 DIAGNOSIS — Z01812 Encounter for preprocedural laboratory examination: Secondary | ICD-10-CM | POA: Diagnosis present

## 2020-02-16 DIAGNOSIS — Z20822 Contact with and (suspected) exposure to covid-19: Secondary | ICD-10-CM | POA: Diagnosis not present

## 2020-02-16 LAB — SARS CORONAVIRUS 2 (TAT 6-24 HRS): SARS Coronavirus 2: NEGATIVE

## 2020-02-17 ENCOUNTER — Ambulatory Visit (INDEPENDENT_AMBULATORY_CARE_PROVIDER_SITE_OTHER): Payer: Medicare HMO | Admitting: Internal Medicine

## 2020-02-17 ENCOUNTER — Encounter: Payer: Self-pay | Admitting: Internal Medicine

## 2020-02-17 VITALS — BP 138/76 | HR 85 | Temp 98.4°F | Ht 65.0 in | Wt 214.2 lb

## 2020-02-17 DIAGNOSIS — N12 Tubulo-interstitial nephritis, not specified as acute or chronic: Secondary | ICD-10-CM | POA: Diagnosis not present

## 2020-02-17 DIAGNOSIS — Z1329 Encounter for screening for other suspected endocrine disorder: Secondary | ICD-10-CM | POA: Diagnosis not present

## 2020-02-17 DIAGNOSIS — E876 Hypokalemia: Secondary | ICD-10-CM

## 2020-02-17 DIAGNOSIS — K760 Fatty (change of) liver, not elsewhere classified: Secondary | ICD-10-CM

## 2020-02-17 DIAGNOSIS — I700 Atherosclerosis of aorta: Secondary | ICD-10-CM | POA: Insufficient documentation

## 2020-02-17 DIAGNOSIS — I10 Essential (primary) hypertension: Secondary | ICD-10-CM | POA: Diagnosis not present

## 2020-02-17 DIAGNOSIS — L219 Seborrheic dermatitis, unspecified: Secondary | ICD-10-CM | POA: Diagnosis not present

## 2020-02-17 DIAGNOSIS — R7303 Prediabetes: Secondary | ICD-10-CM

## 2020-02-17 DIAGNOSIS — N281 Cyst of kidney, acquired: Secondary | ICD-10-CM

## 2020-02-17 MED ORDER — KETOCONAZOLE 2 % EX SHAM: 1.0000 "application " | MEDICATED_SHAMPOO | CUTANEOUS | 11 refills | Status: DC

## 2020-02-17 MED ORDER — CLOBETASOL PROPIONATE 0.05 % EX SOLN: 1.0000 "application " | Freq: Two times a day (BID) | CUTANEOUS | 11 refills | Status: DC

## 2020-02-17 NOTE — Patient Instructions (Addendum)
shingrix vaccine due 02/29/20 on or before  Hepatitis A 2nd dose due  Hepatitis B 3rd and final dose due  Could increase norvasc to 2-2.5 mg pills daily -if this works out comes in 5 mg pill    Hypokalemia Hypokalemia means that the amount of potassium in the blood is lower than normal. Potassium is a chemical (electrolyte) that helps regulate the amount of fluid in the body. It also stimulates muscle tightening (contraction) and helps nerves work properly. Normally, most of the body's potassium is inside cells, and only a very small amount is in the blood. Because the amount in the blood is so small, minor changes to potassium levels in the blood can be life-threatening. What are the causes? This condition may be caused by:  Antibiotic medicine.  Diarrhea or vomiting. Taking too much of a medicine that helps you have a bowel movement (laxative) can cause diarrhea and lead to hypokalemia.  Chronic kidney disease (CKD).  Medicines that help the body get rid of excess fluid (diuretics).  Eating disorders, such as bulimia.  Low magnesium levels in the body.  Sweating a lot. What are the signs or symptoms? Symptoms of this condition include:  Weakness.  Constipation.  Fatigue.  Muscle cramps.  Mental confusion.  Skipped heartbeats or irregular heartbeat (palpitations).  Tingling or numbness. How is this diagnosed? This condition is diagnosed with a blood test. How is this treated? This condition may be treated by:  Taking potassium supplements by mouth.  Adjusting the medicines that you take.  Eating more foods that contain a lot of potassium. If your potassium level is very low, you may need to get potassium through an IV and be monitored in the hospital. Follow these instructions at home:   Take over-the-counter and prescription medicines only as told by your health care provider. This includes vitamins and supplements.  Eat a healthy diet. A healthy diet  includes fresh fruits and vegetables, whole grains, healthy fats, and lean proteins.  If instructed, eat more foods that contain a lot of potassium. This includes: ? Nuts, such as peanuts and pistachios. ? Seeds, such as sunflower seeds and pumpkin seeds. ? Peas, lentils, and lima beans. ? Whole grain and bran cereals and breads. ? Fresh fruits and vegetables, such as apricots, avocado, bananas, cantaloupe, kiwi, oranges, tomatoes, asparagus, and potatoes. ? Orange juice. ? Tomato juice. ? Red meats. ? Yogurt.  Keep all follow-up visits as told by your health care provider. This is important. Contact a health care provider if you:  Have weakness that gets worse.  Feel your heart pounding or racing.  Vomit.  Have diarrhea.  Have diabetes (diabetes mellitus) and you have trouble keeping your blood sugar (glucose) in your target range. Get help right away if you:  Have chest pain.  Have shortness of breath.  Have vomiting or diarrhea that lasts for more than 2 days.  Faint. Summary  Hypokalemia means that the amount of potassium in the blood is lower than normal.  This condition is diagnosed with a blood test.  Hypokalemia may be treated by taking potassium supplements, adjusting the medicines that you take, or eating more foods that are high in potassium.  If your potassium level is very low, you may need to get potassium through an IV and be monitored in the hospital. This information is not intended to replace advice given to you by your health care provider. Make sure you discuss any questions you have with your health care  provider. Document Revised: 10/02/2017 Document Reviewed: 10/02/2017 Elsevier Patient Education  [place].  Pyelonephritis, Adult Pyelonephritis is an infection that occurs in the kidney. The kidneys are the organs that filter a person's blood and move waste out of the bloodstream and into the urine. Urine passes from the kidneys, through  tubes called ureters, and into the bladder. There are two main types of pyelonephritis:  Infections that come on quickly without any warning (acute pyelonephritis).  Infections that last for a long period of time (chronic pyelonephritis). In most cases, the infection clears up with treatment and does not cause further problems. More severe infections or chronic infections can sometimes spread to the bloodstream or lead to other problems with the kidneys. What are the causes? This condition is usually caused by:  Bacteria traveling from the bladder up to the kidney. This may occur after having a bladder infection (cystitis) or urinary tract infection (UTI).  Bladder infections caused from bacteria traveling from the bloodstream to the kidney. What increases the risk? This condition is more likely to develop in:  Pregnant women.  Older people.  People who have any of these conditions: ? Diabetes. ? Inflammation of the prostate gland (prostatitis), in males. ? Kidney stones or bladder stones. ? Other abnormalities of the kidney or ureter. ? Cancer.  People who have a catheter placed in the bladder.  People who are sexually active.  Women who use spermicides.  People who have had a prior UTI. What are the signs or symptoms? Symptoms of this condition include:  Frequent urination.  Strong or persistent urge to urinate.  Burning or stinging when urinating.  Abdominal pain.  Back pain.  Pain in the side or flank area.  Fever or chills.  Blood in the urine, or dark urine.  Nausea or vomiting. How is this diagnosed? This condition may be diagnosed based on:  Your medical history and a physical exam.  Urine tests.  Blood tests. You may also have imaging tests of the kidneys, such as an ultrasound or CT scan. How is this treated? Treatment for this condition may depend on the severity of the infection.  If the infection is mild and is found early, you may be  treated with antibiotic medicines taken by mouth (orally). You will need to drink fluids to remain hydrated.  If the infection is more severe, you may need to stay in the hospital and receive antibiotics given directly into a vein through an IV. You may also need to receive fluids through an IV if you are not able to remain hydrated. After your hospital stay, you may need to take oral antibiotics for a period of time. Other treatments may be required, depending on the cause of the infection. Follow these instructions at home: Medicines  Take your antibiotic medicine as told by your health care provider. Do not stop taking the antibiotic even if you start to feel better.  Take over-the-counter and prescription medicines only as told by your health care provider. General instructions   Drink enough fluid to keep your urine pale yellow.  Avoid caffeine, tea, and carbonated beverages. They tend to irritate the bladder.  Urinate often. Avoid holding in urine for long periods of time.  Urinate before and after sex.  After a bowel movement, women should cleanse from front to back. Use each tissue only once.  Keep all follow-up visits as told by your health care provider. This is important. Contact a health care provider if:  Your symptoms do not get better after 2 days of treatment.  Your symptoms get worse.  You have a fever. Get help right away if you:  Are unable to take your antibiotics or fluids.  Have shaking chills.  Vomit.  Have severe flank or back pain.  Have extreme weakness or fainting. Summary  Pyelonephritis is a urinary tract infection (UTI) that occurs in the kidney.  Treatment for this condition may depend on the severity of the infection.  Take your antibiotic medicine as told by your health care provider. Do not stop taking the antibiotic even if you start to feel better.  Drink enough fluid to keep your urine pale yellow.  Keep all follow-up visits as  told by your health care provider. This is important. This information is not intended to replace advice given to you by your health care provider. Make sure you discuss any questions you have with your health care provider. Document Revised: 12/24/2017 Document Reviewed: 12/24/2017 Elsevier Patient Education  2020 [employer].

## 2020-02-17 NOTE — Progress Notes (Signed)
Chief Complaint  Patient presents with  . Follow-up   F/y  1. HTN sl elevated on norvasc 2.5 mg qd and maxzide 37.5-25 mg qd 140s at home will increase norvasc to 5 mg qd  2. 01/15/20 abnormal cT with left pyelonephritis and given 2 oral abx and rocephin per Dr. Farrel Gordon and 02/17/20 will have left ureterpyleogram  She had a night of fevers and rigors due to above now resolved  3. S/p covid 10/2019 did not have infusion and had 2/2 covid vaccines at time of covid since had itchy irritated scalp and face and was very fatigued with low appetite    Review of Systems  Constitutional: Negative for weight loss.  HENT: Negative for hearing loss.   Eyes: Negative for blurred vision.  Respiratory: Negative for shortness of breath.   Cardiovascular: Negative for chest pain.  Gastrointestinal: Negative for abdominal pain.  Genitourinary: Negative for dysuria.  Musculoskeletal: Positive for back pain.  Skin: Positive for itching and rash.  Psychiatric/Behavioral: Negative for depression.   Past Medical History:  Diagnosis Date  . Arthritis   . Basal cell carcinoma    right temple Dr. Elmer Ramp removed 01/2017   . Chicken pox   . COVID-19    10/13/19  . Emphysema of lung ([place])    h/o bronchitis not emphysema  . Fatty liver    09/19/15  . Gastric ulcer    neg H pylori   . GERD (gastroesophageal reflux disease)   . Gout   . Hiatal hernia    SMALL  . History of kidney stones   . Hyperlipidemia   . Hypertension   . Kidney stone    noted CT ab/pelvis 09/19/15 left kidney   . Kidney stone    Dr. Rogers Blocker 2019   . Lumbar herniated disc   . Recurrent UTI   . Syncope    per pt presyncope no LOC felt dizzy/vision distorted and couldnt speak EEG neg, CT head neg 07/31/16, f/u [facility]ology with US carotid, echo but no Holter   . Umbilical hernia    noted 09/19/15   . UTI (urinary tract infection)    Past Surgical History:  Procedure Laterality Date  . BREAST BIOPSY Right 1980   cyst  removed negative pathology   . EXTRACORPOREAL SHOCK WAVE LITHOTRIPSY Left 09/19/2017   Procedure: EXTRACORPOREAL SHOCK WAVE LITHOTRIPSY (ESWL);  Surgeon: Royston Cowper, MD;  Location: [facility] ORS;  Service: Urology;  Laterality: Left;  . JOINT REPLACEMENT     right knee 11/2015 Dr. Janell Quiet [place]   Family History  Problem Relation Age of Onset  . Breast cancer Mother 18  . Cancer Mother        breast cancer s/p masectomy lived to be 54   . Hypertension Mother   . Cancer Father        NMSC skin cancer   . Gout Father   . Heart disease Father        s/p bypass surgery age 7 lived 5 months after surgery   . Kidney disease Father        RAS>kidney failure   . Hyperlipidemia Other   . Stroke Other   . Kidney cancer Neg Hx   . Bladder Cancer Neg Hx   . Prolactinoma Neg Hx    Social History   Socioeconomic History  . Marital status: Widowed    Spouse name: Not on file  . Number of children: Not on file  .  Years of education: Not on file  . Highest education level: Not on file  Occupational History  . Not on file  Tobacco Use  . Smoking status: Former Smoker    Packs/day: 1.00    Years: 25.00    Pack years: 25.00    Types: Cigarettes    Quit date: 02/08/1994    Years since quitting: 26.0  . Smokeless tobacco: Never Used  . Tobacco comment: smoked total 30 years quit 20 years ago from 2019 max 1 ppd no FH lung cancer   Vaping Use  . Vaping Use: Never used  Substance and Sexual Activity  . Alcohol use: No  . Drug use: Never  . Sexual activity: Not on file  Other Topics Concern  . Not on file  Social History Narrative   2 years college    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   Social Determinants of [employer] Strain: [place]   . Difficulty of Paying Living Expenses: Not hard at all  Food Insecurity: No Food Insecurity  . Worried About Charity fundraiser in the Last Year: Never true  . Ran Out of Food in the Last Year: Never true   Transportation Needs: No Transportation Needs  . Lack of Transportation (Medical): No  . Lack of Transportation (Non-Medical): No  Physical Activity: Unknown  . Days of Exercise per Week: 0 days  . Minutes of Exercise per Session: Not on file  Stress: No Stress Concern Present  . Feeling of Stress : Not at all  Social Connections: Unknown  . Frequency of Communication with Friends and Family: More than three times a week  . Frequency of Social Gatherings with Friends and Family: More than three times a week  . Attends Religious Services: More than 4 times per year  . Active Member of Clubs or Organizations: Yes  . Attends Archivist Meetings: More than 4 times per year  . Marital Status: Not on file  Intimate Partner Violence: Not At Risk  . Fear of Current or Ex-Partner: No  . Emotionally Abused: No  . Physically Abused: No  . Sexually Abused: No   Current Meds  Medication Sig  . acetaminophen (TYLENOL) 500 MG tablet Take 1,000 mg by mouth 2 (two) times daily.   Marland Kitchen allopurinol (ZYLOPRIM) 300 MG tablet Take 1 tablet (300 mg total) by mouth daily. (Patient taking differently: Take 300 mg by mouth every morning.)  . amLODipine (NORVASC) 2.5 MG tablet Take 1-2 tablets (2.5-5 mg total) by mouth daily. Start with 2.5 mg dose if BP >130/>80 take 2 pills =5 mg (Patient taking differently: Take 2.5 mg by mouth every morning.)  . atorvastatin (LIPITOR) 40 MG tablet Take 1 tablet (40 mg total) by mouth daily at 6 PM.  . Cholecalciferol (VITAMIN D3) 5000 units CAPS Take 5,000 Units by mouth daily.   . Cyanocobalamin (B-12) 5000 MCG CAPS Take 5,000 mcg by mouth daily.   Marland Kitchen ezetimibe (ZETIA) 10 MG tablet Take 10 mg by mouth at bedtime.  . gabapentin (NEURONTIN) 300 MG capsule Take 1 capsule (300 mg total) by mouth at bedtime.  Marland Kitchen ketoconazole (NIZORAL) 2 % cream Apply 1 application topically 2 (two) times daily. FACE  . Magnesium 500 MG CAPS Take 500 mg by mouth at bedtime.   .  methocarbamol (ROBAXIN) 750 MG tablet Take 1 tablet (750 mg total) by mouth at bedtime as needed for muscle spasms. (Patient taking differently: Take  750 mg by mouth at bedtime.)  . pantoprazole (PROTONIX) 40 MG tablet Take 1 tablet (40 mg total) by mouth daily. (Patient taking differently: Take 40 mg by mouth at bedtime.)  . potassium chloride (MICRO-K) 10 MEQ CR capsule Take 1 capsule (10 mEq total) by mouth 2 (two) times daily. (Patient taking differently: Take 20 mEq by mouth every morning.)  . traMADol (ULTRAM) 50 MG tablet Take 2 tablets (100 mg total) by mouth 3 (three) times daily as needed. (Patient taking differently: Take 50 mg by mouth 2 (two) times daily.)  . triamterene-hydrochlorothiazide (MAXZIDE-25) 37.5-25 MG tablet Take 1 tablet by mouth daily. (Patient taking differently: Take 1 tablet by mouth every morning.)   Allergies  Allergen Reactions  . Nortriptyline Nausea Only  . Sulfa Antibiotics Rash   Recent Results (from the past 2160 hour(s))  I-STAT creatinine     Status: None   Collection Time: 01/15/20  8:14 AM  Result Value Ref Range   Creatinine, Ser 1.00 0.44 - 1.00 mg/dL  Potassium     Status: Abnormal   Collection Time: 02/11/20 10:15 AM  Result Value Ref Range   Potassium 3.4 (L) 3.5 - 5.1 mmol/L    Comment: Performed at [facility], [address] [place], [place] [address]  SARS CORONAVIRUS 2 (TAT 6-24 HRS) Nasopharyngeal Nasopharyngeal Swab     Status: None   Collection Time: 02/16/20  9:54 AM   Specimen: Nasopharyngeal Swab  Result Value Ref Range   SARS Coronavirus 2 NEGATIVE NEGATIVE    Comment: (NOTE) SARS-CoV-2 target nucleic acids are NOT DETECTED.  The SARS-CoV-2 RNA is generally detectable in upper and lower respiratory specimens during the acute phase of infection. Negative results do not preclude SARS-CoV-2 infection, do not rule out co-infections with other pathogens, and should not be used as the sole basis for treatment or other  patient management decisions. Negative results must be combined with clinical observations, patient history, and epidemiological information. The expected result is Negative.  Fact Sheet for Patients: SugarRoll.be  Fact Sheet for Healthcare Providers: https://www.woods-mathews.com/  This test is not yet approved or cleared by the [place] FDA and  has been authorized for detection and/or diagnosis of SARS-CoV-2 by FDA under an Emergency Use Authorization (EUA). This EUA will remain  in effect (meaning this test can be used) for the duration of the COVID-19 declaration under Se ction 564(b)(1) of the Act, 21 U.S.C. section 360bbb-3(b)(1), unless the authorization is terminated or revoked sooner.  Performed at [facility], [place] [address], [place],  [address]    Objective  Body mass index is 35.64 kg/m. Wt Readings from Last 3 Encounters:  02/17/20 214 lb 3.2 oz (97.2 kg)  01/07/20 220 lb (99.8 kg)  10/26/19 220 lb (99.8 kg)   Temp Readings from Last 3 Encounters:  02/17/20 98.4 F (36.9 C) (Oral)  10/26/19 (!) 96.9 F (36.1 C) (Oral)  09/16/19 97.9 F (36.6 C) (Oral)   BP Readings from Last 3 Encounters:  02/17/20 138/76  09/16/19 132/74  01/02/19 (!) 150/92   Pulse Readings from Last 3 Encounters:  02/17/20 85  09/16/19 77  01/02/19 98    Physical Exam Vitals and nursing note reviewed.  Constitutional:      Appearance: Normal appearance. She is well-developed and well-groomed. She is obese.  HENT:     Head: Normocephalic and atraumatic.  Eyes:     Conjunctiva/sclera: Conjunctivae normal.     Pupils: Pupils are equal, round, and  reactive to light.  Cardiovascular:     Rate and Rhythm: Normal rate and regular rhythm.     Heart sounds: Normal heart sounds. No murmur heard.   Pulmonary:     Effort: Pulmonary effort is normal.     Breath sounds: Normal breath sounds.  Abdominal:     General:  Abdomen is flat. Bowel sounds are normal.     Tenderness: There is left CVA tenderness.  Skin:    General: Skin is warm and dry.  Neurological:     General: No focal deficit present.     Mental Status: She is alert and oriented to person, place, and time. Mental status is at baseline.     Gait: Gait normal.  Psychiatric:        Attention and Perception: Attention and perception normal.        Mood and Affect: Mood and affect normal.        Speech: Speech normal.        Behavior: Behavior normal. Behavior is cooperative.        Thought Content: Thought content normal.        Cognition and Memory: Cognition normal.        Judgment: Judgment normal.     Assessment  Plan  Primary hypertension Increase norvasc to 5 mg qd maxzide 37.5-25 mg qd  Monitor   Seborrheic dermatitis - Plan: ketoconazole (NIZORAL) 2 % shampoo, clobetasol (TEMOVATE) 0.05 % external solution  Kidney cysts Fatty liver Aortic atherosclerosis ([place]) Note ct scan 01/15/20   Hypokalemia Given list   Pyelonephritis  txed Dr. Rogers Blocker f/u 02/17/20 for ureterpyleogram   HM Flu shot hadutd Tdap 05/11/13  covid 2/2 consider pfizer had covid 10/2019 after 2 covid vaccines with reduced appetite did not have MAB and skin dermatitis started face and scalp  prevnar had 05/11/13, pna 12 had 04/09/16 Had zostavax 2014  1/2 shingrix 08/30/19 2nd dose due 02/29/20  Hep B 2/3 had still due for 3rd vaccine has 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 Was due 10/17/17, also had hep A 03/19/17 2nd dose due in 6-12 months h/o fatty liver MMR immune  Colonoscopy overdue disc reconsider anotherin future Declines wants cologuard 09/29/19 + -consider colonoscopy as of 02/17/20  ? Date of last pap per pt no h/o abnormal ptper chart h/o abnormal papDr. Vickki Muff has record. Last I can see 11/21/01 neg pap +BV   Mammogram9/11/19 2.5 cm cyst right breast f/u in 1 year will need to reorder in futurept to call Ordered 2.5 right breast  cyst  DEXA 04/03/12 osteopenia rec calcium 600 mg bid and on vit D3 5000 qd. Vit Dnormal on 5000 IU D3 qd -consider repeat DEXAorderedpending  Former smoker total 30 years max 1 ppd no FH lung cancer quit 20 years ago from 2019CXR ordered 2/2 wheezing  Hep C neg 04/09/16  H/o skin cancer Dr. Janit Bern seen for 2021 f/u in 2021 ln2 face, arms, hands   Provider: Dr. Olivia Mackie McLean-Scocuzza-[facility]

## 2020-02-18 ENCOUNTER — Ambulatory Visit: Payer: Medicare HMO

## 2020-02-18 ENCOUNTER — Ambulatory Visit: Payer: Medicare HMO | Admitting: Certified Registered Nurse Anesthetist

## 2020-02-18 ENCOUNTER — Ambulatory Visit
Admission: RE | Admit: 2020-02-18 | Discharge: 2020-02-18 | Disposition: A | Discharge status: Home / Self Care | Payer: Medicare HMO | Attending: Urology | Admitting: Urology

## 2020-02-18 ENCOUNTER — Encounter: Payer: Self-pay | Admitting: Urology

## 2020-02-18 ENCOUNTER — Encounter
Admission: RE | Disposition: A | Discharge status: Home / Self Care | Payer: Self-pay | Source: Home / Self Care | Attending: Urology

## 2020-02-18 ENCOUNTER — Other Ambulatory Visit: Payer: Self-pay

## 2020-02-18 DIAGNOSIS — Z0389 Encounter for observation for other suspected diseases and conditions ruled out: Secondary | ICD-10-CM | POA: Diagnosis not present

## 2020-02-18 DIAGNOSIS — J449 Chronic obstructive pulmonary disease, unspecified: Secondary | ICD-10-CM | POA: Insufficient documentation

## 2020-02-18 DIAGNOSIS — Z823 Family history of stroke: Secondary | ICD-10-CM | POA: Diagnosis not present

## 2020-02-18 DIAGNOSIS — Z8616 Personal history of COVID-19: Secondary | ICD-10-CM | POA: Insufficient documentation

## 2020-02-18 DIAGNOSIS — Z87442 Personal history of urinary calculi: Secondary | ICD-10-CM | POA: Diagnosis not present

## 2020-02-18 DIAGNOSIS — Z419 Encounter for procedure for purposes other than remedying health state, unspecified: Secondary | ICD-10-CM

## 2020-02-18 DIAGNOSIS — Z87440 Personal history of urinary (tract) infections: Secondary | ICD-10-CM | POA: Diagnosis not present

## 2020-02-18 DIAGNOSIS — Z79899 Other long term (current) drug therapy: Secondary | ICD-10-CM | POA: Insufficient documentation

## 2020-02-18 DIAGNOSIS — R93422 Abnormal radiologic findings on diagnostic imaging of left kidney: Secondary | ICD-10-CM | POA: Diagnosis present

## 2020-02-18 DIAGNOSIS — E785 Hyperlipidemia, unspecified: Secondary | ICD-10-CM | POA: Diagnosis not present

## 2020-02-18 DIAGNOSIS — Z87891 Personal history of nicotine dependence: Secondary | ICD-10-CM | POA: Diagnosis not present

## 2020-02-18 DIAGNOSIS — I10 Essential (primary) hypertension: Secondary | ICD-10-CM | POA: Diagnosis not present

## 2020-02-18 DIAGNOSIS — K219 Gastro-esophageal reflux disease without esophagitis: Secondary | ICD-10-CM | POA: Diagnosis not present

## 2020-02-18 DIAGNOSIS — Z8249 Family history of ischemic heart disease and other diseases of the circulatory system: Secondary | ICD-10-CM | POA: Insufficient documentation

## 2020-02-18 DIAGNOSIS — R9349 Abnormal radiologic findings on diagnostic imaging of other urinary organs: Secondary | ICD-10-CM | POA: Diagnosis not present

## 2020-02-18 DIAGNOSIS — Z882 Allergy status to sulfonamides status: Secondary | ICD-10-CM | POA: Diagnosis not present

## 2020-02-18 HISTORY — PX: CYSTOSCOPY W/ RETROGRADES: SHX1426

## 2020-02-18 SURGERY — CYSTOSCOPY WITH RETROGRADE PYELOGRAM
Anesthesia: General | Laterality: Left

## 2020-02-18 MED ORDER — SUGAMMADEX SODIUM 200 MG/2ML IV SOLN
INTRAVENOUS | Status: DC | PRN
  Administered 2020-02-18: 194.4 mg via INTRAVENOUS

## 2020-02-18 MED ORDER — PROPOFOL 10 MG/ML IV BOLUS
INTRAVENOUS | Status: AC
  Filled 2020-02-18: qty 20

## 2020-02-18 MED ORDER — LACTATED RINGERS IV SOLN: INTRAVENOUS | Status: DC

## 2020-02-18 MED ORDER — BELLADONNA ALKALOIDS-OPIUM 16.2-60 MG RE SUPP
RECTAL | Status: AC
  Filled 2020-02-18: qty 1

## 2020-02-18 MED ORDER — ONDANSETRON HCL 4 MG/2ML IJ SOLN
INTRAMUSCULAR | Status: DC | PRN
  Administered 2020-02-18: 4 mg via INTRAVENOUS

## 2020-02-18 MED ORDER — DEXAMETHASONE SODIUM PHOSPHATE 10 MG/ML IJ SOLN
INTRAMUSCULAR | Status: DC | PRN
  Administered 2020-02-18: 10 mg via INTRAVENOUS

## 2020-02-18 MED ORDER — URIBEL 118 MG PO CAPS: 1.0000 | ORAL_CAPSULE | Freq: Four times a day (QID) | ORAL | 3 refills | Status: DC | PRN

## 2020-02-18 MED ORDER — LIDOCAINE HCL (CARDIAC) PF 100 MG/5ML IV SOSY
PREFILLED_SYRINGE | INTRAVENOUS | Status: DC | PRN
  Administered 2020-02-18: 100 mg via INTRAVENOUS

## 2020-02-18 MED ORDER — ACETAMINOPHEN 325 MG PO TABS: 325.0000 mg | ORAL_TABLET | ORAL | Status: DC | PRN

## 2020-02-18 MED ORDER — DEXAMETHASONE SODIUM PHOSPHATE 10 MG/ML IJ SOLN
INTRAMUSCULAR | Status: AC
  Filled 2020-02-18: qty 1

## 2020-02-18 MED ORDER — LIDOCAINE HCL URETHRAL/MUCOSAL 2 % EX GEL
CUTANEOUS | Status: AC
  Filled 2020-02-18: qty 10

## 2020-02-18 MED ORDER — ACETAMINOPHEN 160 MG/5ML PO SOLN
325.0000 mg | ORAL | Status: DC | PRN
  Filled 2020-02-18: qty 20.3

## 2020-02-18 MED ORDER — FENTANYL CITRATE (PF) 100 MCG/2ML IJ SOLN
INTRAMUSCULAR | Status: DC | PRN
  Administered 2020-02-18: 50 ug via INTRAVENOUS

## 2020-02-18 MED ORDER — ONDANSETRON HCL 4 MG/2ML IJ SOLN: 4.0000 mg | Freq: Once | INTRAMUSCULAR | Status: DC | PRN

## 2020-02-18 MED ORDER — ROCURONIUM BROMIDE 10 MG/ML (PF) SYRINGE
PREFILLED_SYRINGE | INTRAVENOUS | Status: AC
  Filled 2020-02-18: qty 10

## 2020-02-18 MED ORDER — MIDAZOLAM HCL 2 MG/2ML IJ SOLN
INTRAMUSCULAR | Status: AC
  Filled 2020-02-18: qty 2

## 2020-02-18 MED ORDER — LIDOCAINE HCL (PF) 2 % IJ SOLN
INTRAMUSCULAR | Status: AC
  Filled 2020-02-18: qty 5

## 2020-02-18 MED ORDER — CHLORHEXIDINE GLUCONATE 0.12 % MT SOLN: 15.0000 mL | Freq: Once | OROMUCOSAL | Status: AC

## 2020-02-18 MED ORDER — MIDAZOLAM HCL 2 MG/2ML IJ SOLN
INTRAMUSCULAR | Status: DC | PRN
  Administered 2020-02-18: 2 mg via INTRAVENOUS

## 2020-02-18 MED ORDER — SUCCINYLCHOLINE CHLORIDE 200 MG/10ML IV SOSY
PREFILLED_SYRINGE | INTRAVENOUS | Status: AC
  Filled 2020-02-18: qty 10

## 2020-02-18 MED ORDER — SUCCINYLCHOLINE CHLORIDE 20 MG/ML IJ SOLN
INTRAMUSCULAR | Status: DC | PRN
  Administered 2020-02-18: 100 mg via INTRAVENOUS

## 2020-02-18 MED ORDER — PROPOFOL 10 MG/ML IV BOLUS
INTRAVENOUS | Status: DC | PRN
  Administered 2020-02-18: 180 mg via INTRAVENOUS

## 2020-02-18 MED ORDER — FENTANYL CITRATE (PF) 100 MCG/2ML IJ SOLN
INTRAMUSCULAR | Status: AC
  Filled 2020-02-18: qty 2

## 2020-02-18 MED ORDER — FENTANYL CITRATE (PF) 100 MCG/2ML IJ SOLN: 25.0000 ug | INTRAMUSCULAR | Status: DC | PRN

## 2020-02-18 MED ORDER — CEFAZOLIN SODIUM-DEXTROSE 1-4 GM/50ML-% IV SOLN
INTRAVENOUS | Status: AC
  Filled 2020-02-18: qty 50

## 2020-02-18 MED ORDER — LIDOCAINE HCL URETHRAL/MUCOSAL 2 % EX GEL
CUTANEOUS | Status: DC | PRN
  Administered 2020-02-18: 1 "application " via URETHRAL

## 2020-02-18 MED ORDER — CHLORHEXIDINE GLUCONATE 0.12 % MT SOLN
OROMUCOSAL | Status: AC
  Administered 2020-02-18: 15 mL via OROMUCOSAL
  Filled 2020-02-18: qty 15

## 2020-02-18 MED ORDER — ORAL CARE MOUTH RINSE: 15.0000 mL | Freq: Once | OROMUCOSAL | Status: AC

## 2020-02-18 MED ORDER — CEFAZOLIN SODIUM-DEXTROSE 1-4 GM/50ML-% IV SOLN
1.0000 g | Freq: Once | INTRAVENOUS | Status: AC
  Administered 2020-02-18: 1 g via INTRAVENOUS

## 2020-02-18 MED ORDER — ROCURONIUM BROMIDE 100 MG/10ML IV SOLN
INTRAVENOUS | Status: DC | PRN
  Administered 2020-02-18: 5 mg via INTRAVENOUS
  Administered 2020-02-18: 25 mg via INTRAVENOUS

## 2020-02-18 MED ORDER — CIPROFLOXACIN HCL 500 MG PO TABS: 500.0000 mg | ORAL_TABLET | Freq: Two times a day (BID) | ORAL | 0 refills | Status: DC

## 2020-02-18 MED ORDER — ONDANSETRON HCL 4 MG/2ML IJ SOLN
INTRAMUSCULAR | Status: AC
  Filled 2020-02-18: qty 2

## 2020-02-18 SURGICAL SUPPLY — 21 items
BAG DRAIN CYSTO-URO LG1000N (MISCELLANEOUS) ×2
CONRAY 43 FOR UROLOGY 50M (MISCELLANEOUS) ×2
COVER WAND RF STERILE (DRAPES) ×2
GLOVE BIO SURGEON STRL SZ7 (GLOVE) ×4
GLOVE BIO SURGEON STRL SZ7.5 (GLOVE) ×2
GOWN STRL REUS W/ TWL LRG LVL4 (GOWN DISPOSABLE) ×1
GOWN STRL REUS W/ TWL XL LVL3 (GOWN DISPOSABLE) ×1
GOWN STRL REUS W/TWL LRG LVL4 (GOWN DISPOSABLE) ×2
GOWN STRL REUS W/TWL XL LVL3 (GOWN DISPOSABLE) ×2
GUIDEWIRE STR ZIPWIRE 035X150 (MISCELLANEOUS) ×2
KIT TURNOVER CYSTO (KITS) ×2
MANIFOLD NEPTUNE II (INSTRUMENTS) ×2
PACK CYSTO AR (MISCELLANEOUS) ×2
SET CYSTO W/LG BORE CLAMP LF (SET/KITS/TRAYS/PACK) ×2
SOL .9 NS 3000ML IRR  AL (IV SOLUTION) ×1
SOL .9 NS 3000ML IRR AL (IV SOLUTION) ×1
SOL .9 NS 3000ML IRR UROMATIC (IV SOLUTION) ×1
SOL PREP PVP 2OZ (MISCELLANEOUS) ×2
STENT URET 6FRX24 CONTOUR (STENTS) ×2
STENT URET 6FRX26 CONTOUR (STENTS) ×2
WATER STERILE IRR 1000ML POUR (IV SOLUTION) ×2

## 2020-02-18 NOTE — Op Note (Signed)
Preoperative diagnosis: 1.  Abnormal left kidney on CT scan (N28.89)                                             2.  Recent left pyelonephritis versus pyonephrosis (N13.6)  Postoperative diagnosis: Same  Procedure: Cystoscopy with left retrograde pyelography (CPT 52005, [phone])  Surgeon: Otelia Limes. Yves Dill MD  Anesthesia: General  Indications:See the history and physical. After informed consent the above procedure(s) were requested     Technique and findings: After adequate general anesthesia obtained the patient was placed into dorsal lithotomy position and the perineum was prepped and draped in the usual fashion.  36 [place] the scope was coupled with a camera and placed into the bladder.  The bladder was then thoroughly inspected.  Both ureteral orifices were identified and had clear efflux.  No bladder mucosal lesions were identified.  The left ureteral orifice was then cannulated with a 6 [place] open-ended ureteral catheter and retrograde pyelography was performed.  No filling defects or urinary calculi were identified.  Drainage fluoroscopy indicated that the collecting structures drained well with no evidence of obstruction.  There was a questionable mass-effect involving the left lower pole with possible " drooping lily sign".  The bladder was then drained and cystoscope was removed.  10 cc of viscous Xylocaine was instilled within the urethra and bladder.  The procedure was then terminated and patient transferred to the recovery room in stable condition.

## 2020-02-18 NOTE — H&P (Signed)
Date of Initial H&P: 02/09/20  History reviewed, patient examined, no change in status, stable for surgery.

## 2020-02-18 NOTE — Anesthesia Preprocedure Evaluation (Addendum)
Anesthesia Evaluation  Patient identified by MRN, date of birth, ID band Patient awake    Reviewed: Allergy & Precautions, H&P , NPO status , reviewed documented beta blocker date and time   Airway Mallampati: III  TM Distance: <3 FB Neck ROM: full    Dental  (+) Caps   Pulmonary COPD, former smoker,  Pt states doing well, minimal DOE/SOB   Pulmonary exam normal        Cardiovascular hypertension, + DOE  Normal cardiovascular exam  No sig change on EKG   Neuro/Psych  Neuromuscular disease    GI/Hepatic hiatal hernia, PUD, GERD  Medicated and Controlled,  Endo/Other    Renal/GU Renal disease     Musculoskeletal  (+) Arthritis ,   Abdominal   Peds  Hematology   Anesthesia Other Findings Past Medical History: No date: Arthritis No date: Basal cell carcinoma     Comment:  right temple Dr. Elmer Ramp removed 01/2017  No date: Chicken pox No date: COVID-19     Comment:  10/13/19 No date: Emphysema of lung ([place])     Comment:  h/o bronchitis not emphysema No date: Fatty liver     Comment:  09/19/15 No date: Gastric ulcer     Comment:  neg H pylori  No date: GERD (gastroesophageal reflux disease) No date: Gout No date: Hiatal hernia     Comment:  SMALL No date: History of kidney stones No date: Hyperlipidemia No date: Hypertension No date: Kidney stone     Comment:  noted CT ab/pelvis 09/19/15 left kidney  No date: Kidney stone     Comment:  Dr. Rogers Blocker 2019  No date: Lumbar herniated disc No date: Recurrent UTI No date: Syncope     Comment:  per pt presyncope no LOC felt dizzy/vision distorted and              couldnt speak EEG neg, CT head neg 07/31/16, f/u [facility]               cardiology with US carotid, echo but no Holter  No date: Umbilical hernia     Comment:  noted 09/19/15  No date: UTI (urinary tract infection) Past Surgical History: 1980: BREAST BIOPSY; Right     Comment:  cyst removed negative  pathology  09/19/2017: EXTRACORPOREAL SHOCK WAVE LITHOTRIPSY; Left     Comment:  Procedure: EXTRACORPOREAL SHOCK WAVE LITHOTRIPSY (ESWL);              Surgeon: Royston Cowper, MD;  Location: [facility] ORS;                Service: Urology;  Laterality: Left; No date: JOINT REPLACEMENT     Comment:  right knee 11/2015 Dr. Janell Quiet [facility] [place]   Reproductive/Obstetrics                            Anesthesia Physical Anesthesia Plan  ASA: III  Anesthesia Plan: General   Post-op Pain Management:    Induction: Intravenous  PONV Risk Score and Plan: 3 and Ondansetron, Treatment may vary due to age or medical condition and Propofol infusion  Airway Management Planned: Oral ETT  Additional Equipment:   Intra-op Plan:   Post-operative Plan: Extubation in OR  Informed Consent: I have reviewed the patients History and Physical, chart, labs and discussed the procedure including the risks, benefits and alternatives for the proposed anesthesia with the patient or authorized representative who has  indicated his/her understanding and acceptance.     Dental Advisory Given  Plan Discussed with: CRNA  Anesthesia Plan Comments:        Anesthesia Quick Evaluation

## 2020-02-18 NOTE — Anesthesia Procedure Notes (Signed)
Procedure Name: Intubation Performed by: Idris Edmundson, CRNA Pre-anesthesia Checklist: Patient identified, Patient being monitored, Timeout performed, Emergency Drugs available and Suction available Patient Re-evaluated:Patient Re-evaluated prior to induction Oxygen Delivery Method: Circle system utilized Preoxygenation: Pre-oxygenation with 100% oxygen Induction Type: IV induction Ventilation: Mask ventilation without difficulty Laryngoscope Size: 3 and McGraph Grade View: Grade I Tube type: Oral Tube size: 7.0 mm Number of attempts: 1 Airway Equipment and Method: Stylet and Video-laryngoscopy Placement Confirmation: ETT inserted through vocal cords under direct vision,  positive ETCO2 and breath sounds checked- equal and bilateral Secured at: 21 cm Tube secured with: Tape Dental Injury: Teeth and Oropharynx as per pre-operative assessment        

## 2020-02-18 NOTE — Discharge Instructions (Signed)

## 2020-02-18 NOTE — Transfer of Care (Signed)
Immediate Anesthesia Transfer of Care Note  Patient: Cynthia Bryan  Procedure(s) Performed: CYSTOSCOPY WITH RETROGRADE PYELOGRAM (Left )  Patient Location: PACU  Anesthesia Type:General  Level of Consciousness: drowsy  Airway & Oxygen Therapy: Patient Spontanous Breathing and Patient connected to face mask oxygen  Post-op Assessment: Report given to RN and Post -op Vital signs reviewed and stable  Post vital signs: Reviewed and stable  Last Vitals:  Vitals Value Taken Time  BP 160/67 02/18/20 1259  Temp 36.2 C 02/18/20 1259  Pulse 73 02/18/20 1259  Resp 14 02/18/20 1259  SpO2 97 % 02/18/20 1259    Last Pain:  Vitals:   02/18/20 1259  TempSrc: Temporal  PainSc: 4          Complications: No complications documented.

## 2020-02-19 NOTE — Anesthesia Postprocedure Evaluation (Signed)
Anesthesia Post Note  Patient: Cynthia Bryan  Procedure(s) Performed: CYSTOSCOPY WITH RETROGRADE PYELOGRAM (Left )  Patient location during evaluation: PACU Anesthesia Type: General Level of consciousness: awake and alert Pain management: pain level controlled Vital Signs Assessment: post-procedure vital signs reviewed and stable Respiratory status: spontaneous breathing, nonlabored ventilation and respiratory function stable Cardiovascular status: blood pressure returned to baseline and stable Postop Assessment: no apparent nausea or vomiting Anesthetic complications: no   No complications documented.   Last Vitals:  Vitals:   02/18/20 1248 02/18/20 1259  BP: (!) 157/71 (!) 160/67  Pulse: 74 73  Resp: 14 14  Temp: (!) 36.2 C (!) 36.2 C  SpO2: 100% 97%    Last Pain:  Vitals:   02/19/20 0832  TempSrc:   PainSc: 0-No pain                 Alphonsus Sias

## 2020-02-25 DIAGNOSIS — N281 Cyst of kidney, acquired: Secondary | ICD-10-CM | POA: Diagnosis not present

## 2020-03-01 ENCOUNTER — Other Ambulatory Visit: Payer: Self-pay | Admitting: Urology

## 2020-03-01 DIAGNOSIS — N281 Cyst of kidney, acquired: Secondary | ICD-10-CM

## 2020-03-08 ENCOUNTER — Ambulatory Visit: Payer: Medicare HMO

## 2020-03-16 ENCOUNTER — Other Ambulatory Visit: Payer: Self-pay

## 2020-03-16 ENCOUNTER — Ambulatory Visit
Admission: RE | Admit: 2020-03-16 | Discharge: 2020-03-16 | Disposition: A | Discharge status: Home / Self Care | Payer: Medicare HMO | Source: Ambulatory Visit | Attending: Urology | Admitting: Urology

## 2020-03-16 DIAGNOSIS — Z87440 Personal history of urinary (tract) infections: Secondary | ICD-10-CM | POA: Diagnosis not present

## 2020-03-16 DIAGNOSIS — N281 Cyst of kidney, acquired: Secondary | ICD-10-CM | POA: Insufficient documentation

## 2020-03-16 LAB — POCT I-STAT CREATININE: Creatinine, Ser: 1 mg/dL (ref 0.44–1.00)

## 2020-03-16 MED ORDER — IOHEXOL 300 MG/ML  SOLN
100.0000 mL | Freq: Once | INTRAMUSCULAR | Status: AC | PRN
  Administered 2020-03-16: 100 mL via INTRAVENOUS

## 2020-03-23 DIAGNOSIS — N281 Cyst of kidney, acquired: Secondary | ICD-10-CM | POA: Diagnosis not present

## 2020-03-23 DIAGNOSIS — N952 Postmenopausal atrophic vaginitis: Secondary | ICD-10-CM | POA: Diagnosis not present

## 2020-03-23 DIAGNOSIS — N3020 Other chronic cystitis without hematuria: Secondary | ICD-10-CM | POA: Diagnosis not present

## 2020-04-11 ENCOUNTER — Other Ambulatory Visit: Payer: Self-pay | Admitting: Internal Medicine

## 2020-04-11 ENCOUNTER — Telehealth: Payer: Self-pay

## 2020-04-11 DIAGNOSIS — M5440 Lumbago with sciatica, unspecified side: Secondary | ICD-10-CM

## 2020-04-11 DIAGNOSIS — E876 Hypokalemia: Secondary | ICD-10-CM

## 2020-04-11 DIAGNOSIS — I10 Essential (primary) hypertension: Secondary | ICD-10-CM

## 2020-04-11 DIAGNOSIS — M109 Gout, unspecified: Secondary | ICD-10-CM

## 2020-04-11 DIAGNOSIS — M5126 Other intervertebral disc displacement, lumbar region: Secondary | ICD-10-CM

## 2020-04-11 DIAGNOSIS — M25551 Pain in right hip: Secondary | ICD-10-CM

## 2020-04-11 DIAGNOSIS — M5416 Radiculopathy, lumbar region: Secondary | ICD-10-CM

## 2020-04-11 DIAGNOSIS — R937 Abnormal findings on diagnostic imaging of other parts of musculoskeletal system: Secondary | ICD-10-CM

## 2020-04-11 DIAGNOSIS — E785 Hyperlipidemia, unspecified: Secondary | ICD-10-CM

## 2020-04-11 DIAGNOSIS — L219 Seborrheic dermatitis, unspecified: Secondary | ICD-10-CM

## 2020-04-11 DIAGNOSIS — M25552 Pain in left hip: Secondary | ICD-10-CM

## 2020-04-11 DIAGNOSIS — K219 Gastro-esophageal reflux disease without esophagitis: Secondary | ICD-10-CM

## 2020-04-11 MED ORDER — KETOCONAZOLE 2 % EX CREA: 1.0000 "application " | TOPICAL_CREAM | Freq: Two times a day (BID) | CUTANEOUS | 11 refills | Status: DC | PRN

## 2020-04-11 MED ORDER — AMLODIPINE BESYLATE 2.5 MG PO TABS: 2.5000 mg | ORAL_TABLET | Freq: Every day | ORAL | 11 refills | Status: DC

## 2020-04-11 MED ORDER — TRAMADOL HCL 50 MG PO TABS: 100.0000 mg | ORAL_TABLET | Freq: Three times a day (TID) | ORAL | 5 refills | Status: DC | PRN

## 2020-04-11 MED ORDER — POTASSIUM CHLORIDE ER 10 MEQ PO CPCR: 20.0000 meq | ORAL_CAPSULE | ORAL | 3 refills | Status: DC

## 2020-04-11 MED ORDER — ATORVASTATIN CALCIUM 40 MG PO TABS: 40.0000 mg | ORAL_TABLET | Freq: Every day | ORAL | 3 refills | Status: DC

## 2020-04-11 MED ORDER — HYDROCORTISONE 2.5 % EX CREA: TOPICAL_CREAM | Freq: Two times a day (BID) | CUTANEOUS | 11 refills | Status: DC

## 2020-04-11 MED ORDER — EZETIMIBE 10 MG PO TABS: 10.0000 mg | ORAL_TABLET | Freq: Every day | ORAL | 3 refills | Status: DC

## 2020-04-11 MED ORDER — KETOCONAZOLE 2 % EX SHAM: 1.0000 "application " | MEDICATED_SHAMPOO | CUTANEOUS | 11 refills | Status: DC

## 2020-04-11 MED ORDER — GABAPENTIN 300 MG PO CAPS: 300.0000 mg | ORAL_CAPSULE | Freq: Every day | ORAL | 3 refills | Status: DC

## 2020-04-11 MED ORDER — METHOCARBAMOL 750 MG PO TABS: 750.0000 mg | ORAL_TABLET | Freq: Every evening | ORAL | 5 refills | Status: DC | PRN

## 2020-04-11 MED ORDER — ALLOPURINOL 300 MG PO TABS: 300.0000 mg | ORAL_TABLET | Freq: Every day | ORAL | 3 refills | Status: DC

## 2020-04-11 MED ORDER — CLOBETASOL PROPIONATE 0.05 % EX SOLN: 1.0000 "application " | Freq: Two times a day (BID) | CUTANEOUS | 11 refills | Status: DC

## 2020-04-11 MED ORDER — TRIAMTERENE-HCTZ 37.5-25 MG PO TABS: 1.0000 | ORAL_TABLET | ORAL | 3 refills | Status: DC

## 2020-04-11 MED ORDER — PANTOPRAZOLE SODIUM 40 MG PO TBEC: 40.0000 mg | DELAYED_RELEASE_TABLET | Freq: Every day | ORAL | 3 refills | Status: DC

## 2020-04-11 NOTE — Telephone Encounter (Signed)
Pt has called [facility] to get her prescriptions transferred to Cynthia Bryan and they still haven't sent them and she is completely out of ezetimibe, methocarbamol, and tramadol. She would like for [place] to start sending all of her prescriptions to [facility].

## 2020-04-26 DIAGNOSIS — M62838 Other muscle spasm: Secondary | ICD-10-CM | POA: Diagnosis not present

## 2020-04-26 DIAGNOSIS — I10 Essential (primary) hypertension: Secondary | ICD-10-CM | POA: Diagnosis not present

## 2020-04-26 DIAGNOSIS — M109 Gout, unspecified: Secondary | ICD-10-CM | POA: Diagnosis not present

## 2020-04-26 DIAGNOSIS — K219 Gastro-esophageal reflux disease without esophagitis: Secondary | ICD-10-CM | POA: Diagnosis not present

## 2020-04-26 DIAGNOSIS — M1990 Unspecified osteoarthritis, unspecified site: Secondary | ICD-10-CM | POA: Diagnosis not present

## 2020-04-26 DIAGNOSIS — E785 Hyperlipidemia, unspecified: Secondary | ICD-10-CM | POA: Diagnosis not present

## 2020-04-26 DIAGNOSIS — E669 Obesity, unspecified: Secondary | ICD-10-CM | POA: Diagnosis not present

## 2020-04-26 DIAGNOSIS — G629 Polyneuropathy, unspecified: Secondary | ICD-10-CM | POA: Diagnosis not present

## 2020-04-26 DIAGNOSIS — G8929 Other chronic pain: Secondary | ICD-10-CM | POA: Diagnosis not present

## 2020-04-26 DIAGNOSIS — M545 Low back pain, unspecified: Secondary | ICD-10-CM | POA: Diagnosis not present

## 2020-04-27 ENCOUNTER — Other Ambulatory Visit: Payer: Self-pay

## 2020-04-27 ENCOUNTER — Other Ambulatory Visit (INDEPENDENT_AMBULATORY_CARE_PROVIDER_SITE_OTHER): Payer: Medicare HMO

## 2020-04-27 DIAGNOSIS — Z1329 Encounter for screening for other suspected endocrine disorder: Secondary | ICD-10-CM | POA: Diagnosis not present

## 2020-04-27 DIAGNOSIS — N12 Tubulo-interstitial nephritis, not specified as acute or chronic: Secondary | ICD-10-CM

## 2020-04-27 DIAGNOSIS — R7303 Prediabetes: Secondary | ICD-10-CM

## 2020-04-27 DIAGNOSIS — I10 Essential (primary) hypertension: Secondary | ICD-10-CM | POA: Diagnosis not present

## 2020-04-27 LAB — COMPREHENSIVE METABOLIC PANEL WITH GFR
ALT: 18 U/L (ref 0–35)
AST: 14 U/L (ref 0–37)
Albumin: 4.2 g/dL (ref 3.5–5.2)
Alkaline Phosphatase: 94 U/L (ref 39–117)
BUN: 23 mg/dL (ref 6–23)
CO2: 31 meq/L (ref 19–32)
Calcium: 9.8 mg/dL (ref 8.4–10.5)
Chloride: 100 meq/L (ref 96–112)
Creatinine, Ser: 0.98 mg/dL (ref 0.40–1.20)
GFR: 57.63 mL/min — ABNORMAL LOW
Glucose, Bld: 104 mg/dL — ABNORMAL HIGH (ref 70–99)
Potassium: 3.9 meq/L (ref 3.5–5.1)
Sodium: 141 meq/L (ref 135–145)
Total Bilirubin: 0.8 mg/dL (ref 0.2–1.2)
Total Protein: 7 g/dL (ref 6.0–8.3)

## 2020-04-27 LAB — LIPID PANEL
Cholesterol: 146 mg/dL (ref 0–200)
HDL: 52.7 mg/dL
NonHDL: 93.43
Total CHOL/HDL Ratio: 3
Triglycerides: 211 mg/dL — ABNORMAL HIGH (ref 0.0–149.0)
VLDL: 42.2 mg/dL — ABNORMAL HIGH (ref 0.0–40.0)

## 2020-04-27 LAB — CBC WITH DIFFERENTIAL/PLATELET
Basophils Absolute: 0 10*3/uL (ref 0.0–0.1)
Basophils Relative: 0.2 % (ref 0.0–3.0)
Eosinophils Absolute: 0.2 10*3/uL (ref 0.0–0.7)
Eosinophils Relative: 3.7 % (ref 0.0–5.0)
HCT: 41.4 % (ref 36.0–46.0)
Hemoglobin: 13.7 g/dL (ref 12.0–15.0)
Lymphocytes Relative: 37.8 % (ref 12.0–46.0)
Lymphs Abs: 2.3 10*3/uL (ref 0.7–4.0)
MCHC: 33.1 g/dL (ref 30.0–36.0)
MCV: 92.4 fl (ref 78.0–100.0)
Monocytes Absolute: 0.4 10*3/uL (ref 0.1–1.0)
Monocytes Relative: 5.9 % (ref 3.0–12.0)
Neutro Abs: 3.2 10*3/uL (ref 1.4–7.7)
Neutrophils Relative %: 52.4 % (ref 43.0–77.0)
Platelets: 209 10*3/uL (ref 150.0–400.0)
RBC: 4.47 Mil/uL (ref 3.87–5.11)
RDW: 16 % — ABNORMAL HIGH (ref 11.5–15.5)
WBC: 6.2 10*3/uL (ref 4.0–10.5)

## 2020-04-27 LAB — LDL CHOLESTEROL, DIRECT: Direct LDL: 57 mg/dL

## 2020-04-27 LAB — HEMOGLOBIN A1C: Hgb A1c MFr Bld: 6.1 % (ref 4.6–6.5)

## 2020-04-27 LAB — TSH: TSH: 2.33 u[IU]/mL (ref 0.35–4.50)

## 2020-07-20 ENCOUNTER — Ambulatory Visit (INDEPENDENT_AMBULATORY_CARE_PROVIDER_SITE_OTHER): Payer: Medicare HMO

## 2020-07-20 ENCOUNTER — Ambulatory Visit (INDEPENDENT_AMBULATORY_CARE_PROVIDER_SITE_OTHER): Payer: Medicare HMO | Admitting: Internal Medicine

## 2020-07-20 ENCOUNTER — Encounter: Payer: Self-pay | Admitting: Internal Medicine

## 2020-07-20 ENCOUNTER — Other Ambulatory Visit: Payer: Self-pay

## 2020-07-20 VITALS — BP 112/80 | HR 78 | Temp 98.1°F | Ht 65.0 in | Wt 224.0 lb

## 2020-07-20 DIAGNOSIS — M25552 Pain in left hip: Secondary | ICD-10-CM

## 2020-07-20 DIAGNOSIS — M25551 Pain in right hip: Secondary | ICD-10-CM | POA: Diagnosis not present

## 2020-07-20 DIAGNOSIS — N281 Cyst of kidney, acquired: Secondary | ICD-10-CM

## 2020-07-20 DIAGNOSIS — M5416 Radiculopathy, lumbar region: Secondary | ICD-10-CM

## 2020-07-20 DIAGNOSIS — N390 Urinary tract infection, site not specified: Secondary | ICD-10-CM

## 2020-07-20 DIAGNOSIS — Z0001 Encounter for general adult medical examination with abnormal findings: Secondary | ICD-10-CM | POA: Diagnosis not present

## 2020-07-20 DIAGNOSIS — M479 Spondylosis, unspecified: Secondary | ICD-10-CM

## 2020-07-20 DIAGNOSIS — M1A9XX0 Chronic gout, unspecified, without tophus (tophi): Secondary | ICD-10-CM

## 2020-07-20 DIAGNOSIS — M1710 Unilateral primary osteoarthritis, unspecified knee: Secondary | ICD-10-CM | POA: Insufficient documentation

## 2020-07-20 DIAGNOSIS — M5126 Other intervertebral disc displacement, lumbar region: Secondary | ICD-10-CM

## 2020-07-20 DIAGNOSIS — R7303 Prediabetes: Secondary | ICD-10-CM

## 2020-07-20 DIAGNOSIS — R937 Abnormal findings on diagnostic imaging of other parts of musculoskeletal system: Secondary | ICD-10-CM | POA: Diagnosis not present

## 2020-07-20 DIAGNOSIS — I10 Essential (primary) hypertension: Secondary | ICD-10-CM | POA: Diagnosis not present

## 2020-07-20 DIAGNOSIS — G8929 Other chronic pain: Secondary | ICD-10-CM | POA: Insufficient documentation

## 2020-07-20 DIAGNOSIS — M51369 Other intervertebral disc degeneration, lumbar region without mention of lumbar back pain or lower extremity pain: Secondary | ICD-10-CM

## 2020-07-20 DIAGNOSIS — M25511 Pain in right shoulder: Secondary | ICD-10-CM

## 2020-07-20 DIAGNOSIS — M5136 Other intervertebral disc degeneration, lumbar region: Secondary | ICD-10-CM | POA: Diagnosis not present

## 2020-07-20 DIAGNOSIS — Z0000 Encounter for general adult medical examination without abnormal findings: Secondary | ICD-10-CM

## 2020-07-20 MED ORDER — TRAMADOL HCL 50 MG PO TABS: 100.0000 mg | ORAL_TABLET | Freq: Three times a day (TID) | ORAL | 5 refills | Status: DC | PRN

## 2020-07-20 NOTE — Progress Notes (Signed)
Chief Complaint  Patient presents with  . Follow-up   Annual 1. htn controlled on norvasc 2.5 up to bid, maxzide 37.5-25 mg qd  2. X 2 months right shoulder pain and rom issues 6/10 nothing tried no injury and chronic back pain 5/10 today wants referral [facility] in the past helped back pain  3. Recurrent uti and pyleo and right kidney cysts urology Dr. Rogers Blocker will repeat US 10/2020 and see for f/u   Review of Systems  Constitutional: Negative for weight loss.  HENT: Negative for hearing loss.   Eyes: Negative for blurred vision.  Cardiovascular: Negative for chest pain.  Gastrointestinal: Negative for abdominal pain.  Genitourinary: Negative for dysuria.  Musculoskeletal: Positive for joint pain.  Skin: Negative for rash.  Neurological: Negative for headaches.  Psychiatric/Behavioral: Negative for depression.   Past Medical History:  Diagnosis Date  . Arthritis   . Basal cell carcinoma    right temple Dr. Elmer Ramp removed 01/2017   . Chicken pox   . COVID-19    10/13/19  . Emphysema of lung ([place])    h/o bronchitis not emphysema  . Fatty liver    09/19/15  . Gastric ulcer    neg H pylori   . GERD (gastroesophageal reflux disease)   . Gout   . Hiatal hernia    SMALL  . History of kidney stones   . Hyperlipidemia   . Hypertension   . Kidney stone    noted CT ab/pelvis 09/19/15 left kidney   . Kidney stone    Dr. Rogers Blocker 2019   . Lumbar herniated disc   . Recurrent UTI   . Syncope    per pt presyncope no LOC felt dizzy/vision distorted and couldnt speak EEG neg, CT head neg 07/31/16, f/u [facility] cardiology with US carotid, echo but no Holter   . Umbilical hernia    noted 09/19/15   . UTI (urinary tract infection)    Past Surgical History:  Procedure Laterality Date  . BREAST BIOPSY Right 1980   cyst removed negative pathology   . CYSTOSCOPY W/ RETROGRADES Left 02/18/2020   Procedure: CYSTOSCOPY WITH RETROGRADE PYELOGRAM;  Surgeon: Royston Cowper, MD;   Location: [facility] ORS;  Service: Urology;  Laterality: Left;  . EXTRACORPOREAL SHOCK WAVE LITHOTRIPSY Left 09/19/2017   Procedure: EXTRACORPOREAL SHOCK WAVE LITHOTRIPSY (ESWL);  Surgeon: Royston Cowper, MD;  Location: [facility] ORS;  Service: Urology;  Laterality: Left;  . JOINT REPLACEMENT     right knee 11/2015 Dr. Janell Quiet [facility] [place]   Family History  Problem Relation Age of Onset  . Breast cancer Mother 93  . Cancer Mother        breast cancer s/p masectomy lived to be 63   . Hypertension Mother   . Cancer Father        NMSC skin cancer   . Gout Father   . Heart disease Father        s/p bypass surgery age 33 lived 5 months after surgery   . Kidney disease Father        RAS>kidney failure   . Hyperlipidemia Other   . Stroke Other   . Kidney cancer Neg Hx   . Bladder Cancer Neg Hx   . Prolactinoma Neg Hx    Social History   Socioeconomic History  . Marital status: Widowed    Spouse name: Not on file  . Number of children: Not on file  . Years of education:  Not on file  . Highest education level: Not on file  Occupational History  . Not on file  Tobacco Use  . Smoking status: Former Smoker    Packs/day: 1.00    Years: 25.00    Pack years: 25.00    Types: Cigarettes    Quit date: 02/08/1994    Years since quitting: 26.4  . Smokeless tobacco: Never Used  . Tobacco comment: smoked total 30 years quit 20 years ago from 2019 max 1 ppd no FH lung cancer   Vaping Use  . Vaping Use: Never used  Substance and Sexual Activity  . Alcohol use: No  . Drug use: Never  . Sexual activity: Not on file  Other Topics Concern  . Not on file  Social History Narrative   2 years college    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   Social Determinants of [employer] Strain: [place]   . Difficulty of Paying Living Expenses: Not hard at all  Food Insecurity: No Food Insecurity  . Worried About Charity fundraiser in the Last Year: Never true  . Ran Out of Food  in the Last Year: Never true  Transportation Needs: No Transportation Needs  . Lack of Transportation (Medical): No  . Lack of Transportation (Non-Medical): No  Physical Activity: Unknown  . Days of Exercise per Week: 0 days  . Minutes of Exercise per Session: Not on file  Stress: No Stress Concern Present  . Feeling of Stress : Not at all  Social Connections: Unknown  . Frequency of Communication with Friends and Family: More than three times a week  . Frequency of Social Gatherings with Friends and Family: More than three times a week  . Attends Religious Services: More than 4 times per year  . Active Member of Clubs or Organizations: Yes  . Attends Archivist Meetings: More than 4 times per year  . Marital Status: Not on file  Intimate Partner Violence: Not At Risk  . Fear of Current or Ex-Partner: No  . Emotionally Abused: No  . Physically Abused: No  . Sexually Abused: No   Current Meds  Medication Sig  . acetaminophen (TYLENOL) 500 MG tablet Take 1,000 mg by mouth 2 (two) times daily.   Marland Kitchen allopurinol (ZYLOPRIM) 300 MG tablet Take 1 tablet (300 mg total) by mouth daily.  Marland Kitchen amLODipine (NORVASC) 2.5 MG tablet Take 1-2 tablets (2.5-5 mg total) by mouth daily. Start with 2.5 mg dose if BP >130/>80 take 2 pills =5 mg  . atorvastatin (LIPITOR) 40 MG tablet Take 1 tablet (40 mg total) by mouth daily at 6 PM.  . Cholecalciferol (VITAMIN D3) 5000 units CAPS Take 5,000 Units by mouth daily.   . Cyanocobalamin (B-12) 5000 MCG CAPS Take 5,000 mcg by mouth daily.   Marland Kitchen ezetimibe (ZETIA) 10 MG tablet Take 1 tablet (10 mg total) by mouth at bedtime.  . gabapentin (NEURONTIN) 300 MG capsule Take 1 capsule (300 mg total) by mouth at bedtime.  Marland Kitchen ketoconazole (NIZORAL) 2 % cream Apply 1 application topically 2 (two) times daily as needed for irritation. FACE  . Magnesium 500 MG CAPS Take 500 mg by mouth at bedtime.   . methocarbamol (ROBAXIN) 750 MG tablet Take 1 tablet (750 mg total) by  mouth at bedtime as needed for muscle spasms.  . pantoprazole (PROTONIX) 40 MG tablet Take 1 tablet (40 mg total) by mouth at bedtime.  . potassium  chloride (MICRO-K) 10 MEQ CR capsule Take 2 capsules (20 mEq total) by mouth every morning.  . triamterene-hydrochlorothiazide (MAXZIDE-25) 37.5-25 MG tablet Take 1 tablet by mouth every morning.  . [DISCONTINUED] traMADol (ULTRAM) 50 MG tablet Take 2 tablets (100 mg total) by mouth 3 (three) times daily as needed.   Allergies  Allergen Reactions  . Nortriptyline Nausea Only  . Sulfa Antibiotics Rash   Recent Results (from the past 2160 hour(s))  Hemoglobin A1c     Status: None   Collection Time: 04/27/20  8:14 AM  Result Value Ref Range   Hgb A1c MFr Bld 6.1 4.6 - 6.5 %    Comment: Glycemic Control Guidelines for People with Diabetes:Non Diabetic:  <6%Goal of Therapy: <7%Additional Action Suggested:  >8%   TSH     Status: None   Collection Time: 04/27/20  8:14 AM  Result Value Ref Range   TSH 2.33 0.35 - 4.50 uIU/mL  CBC w/Diff     Status: Abnormal   Collection Time: 04/27/20  8:14 AM  Result Value Ref Range   WBC 6.2 4.0 - 10.5 K/uL   RBC 4.47 3.87 - 5.11 Mil/uL   Hemoglobin 13.7 12.0 - 15.0 g/dL   HCT 41.4 36.0 - 46.0 %   MCV 92.4 78.0 - 100.0 fl   MCHC 33.1 30.0 - 36.0 g/dL   RDW 16.0 (H) 11.5 - 15.5 %   Platelets 209.0 150.0 - 400.0 K/uL   Neutrophils Relative % 52.4 43.0 - 77.0 %   Lymphocytes Relative 37.8 12.0 - 46.0 %   Monocytes Relative 5.9 3.0 - 12.0 %   Eosinophils Relative 3.7 0.0 - 5.0 %   Basophils Relative 0.2 0.0 - 3.0 %   Neutro Abs 3.2 1.4 - 7.7 K/uL   Lymphs Abs 2.3 0.7 - 4.0 K/uL   Monocytes Absolute 0.4 0.1 - 1.0 K/uL   Eosinophils Absolute 0.2 0.0 - 0.7 K/uL   Basophils Absolute 0.0 0.0 - 0.1 K/uL  Lipid panel     Status: Abnormal   Collection Time: 04/27/20  8:14 AM  Result Value Ref Range   Cholesterol 146 0 - 200 mg/dL    Comment: ATP III Classification       Desirable:  < 200 mg/dL                Borderline High:  200 - 239 mg/dL          High:  > = 240 mg/dL   Triglycerides 211.0 (H) 0.0 - 149.0 mg/dL    Comment: Normal:  <150 mg/dLBorderline High:  150 - 199 mg/dL   HDL 52.70 >39.00 mg/dL   VLDL 42.2 (H) 0.0 - 40.0 mg/dL   Total CHOL/HDL Ratio 3     Comment:                Men          Women1/2 Average Risk     3.4          3.3Average Risk          5.0          4.42X Average Risk          9.6          7.13X Average Risk          15.0          11.0  NonHDL 93.43     Comment: NOTE:  Non-HDL goal should be 30 mg/dL higher than patient's LDL goal (i.e. LDL goal of < 70 mg/dL, would have non-HDL goal of < 100 mg/dL)  Comprehensive metabolic panel     Status: Abnormal   Collection Time: 04/27/20  8:14 AM  Result Value Ref Range   Sodium 141 135 - 145 mEq/L   Potassium 3.9 3.5 - 5.1 mEq/L   Chloride 100 96 - 112 mEq/L   CO2 31 19 - 32 mEq/L   Glucose, Bld 104 (H) 70 - 99 mg/dL   BUN 23 6 - 23 mg/dL   Creatinine, Ser 0.98 0.40 - 1.20 mg/dL   Total Bilirubin 0.8 0.2 - 1.2 mg/dL   Alkaline Phosphatase 94 39 - 117 U/L   AST 14 0 - 37 U/L   ALT 18 0 - 35 U/L   Total Protein 7.0 6.0 - 8.3 g/dL   Albumin 4.2 3.5 - 5.2 g/dL   GFR 57.63 (L) >60.00 mL/min    Comment: Calculated using the CKD-EPI Creatinine Equation (2021)   Calcium 9.8 8.4 - 10.5 mg/dL  LDL cholesterol, direct     Status: None   Collection Time: 04/27/20  8:14 AM  Result Value Ref Range   Direct LDL 57.0 mg/dL    Comment: Optimal:  <100 mg/dLNear or Above Optimal:  100-129 mg/dLBorderline High:  130-159 mg/dLHigh:  160-189 mg/dLVery High:  >190 mg/dL   Objective  Body mass index is 37.28 kg/m. Wt Readings from Last 3 Encounters:  07/20/20 224 lb (101.6 kg)  02/18/20 214 lb 4.6 oz (97.2 kg)  02/17/20 214 lb 3.2 oz (97.2 kg)   Temp Readings from Last 3 Encounters:  07/20/20 98.1 F (36.7 C) (Oral)  02/18/20 (!) 97.1 F (36.2 C) (Temporal)  02/17/20 98.4 F (36.9 C) (Oral)   BP Readings  from Last 3 Encounters:  07/20/20 112/80  02/18/20 (!) 160/67  02/17/20 138/76   Pulse Readings from Last 3 Encounters:  07/20/20 78  02/18/20 73  02/17/20 85    Physical Exam Vitals and nursing note reviewed.  Constitutional:      Appearance: Normal appearance. She is well-developed and well-groomed. She is obese.  HENT:     Head: Normocephalic and atraumatic.  Eyes:     Conjunctiva/sclera: Conjunctivae normal.     Pupils: Pupils are equal, round, and reactive to light.  Cardiovascular:     Rate and Rhythm: Normal rate and regular rhythm.     Heart sounds: Normal heart sounds. No murmur heard.   Pulmonary:     Effort: Pulmonary effort is normal.     Breath sounds: Normal breath sounds.  Abdominal:     Tenderness: There is no right CVA tenderness or left CVA tenderness.  Skin:    General: Skin is warm and dry.  Neurological:     General: No focal deficit present.     Mental Status: She is alert and oriented to person, place, and time. Mental status is at baseline.     Gait: Gait normal.  Psychiatric:        Attention and Perception: Attention and perception normal.        Mood and Affect: Mood and affect normal.        Speech: Speech normal.        Behavior: Behavior normal. Behavior is cooperative.        Thought Content: Thought content normal.        Cognition and  Memory: Cognition and memory normal.        Judgment: Judgment normal.     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 Was due 10/17/17, also had hep A 03/19/17 2nd dose due in 6-12 months h/o fatty liver -as of 07/20/20 has not had   MMR  immune  Colonoscopy overdue disc reconsider anotherin future Declines wants cologuard7/27/21 + -consider colonoscopy as of 02/17/20, 07/20/20 she will let me know where wants to go  ? Date of last pap per pt no h/o abnormal ptper chart h/o abnormal papDr. Vickki Muff has record. Last I can see 11/21/01 neg pap +BV   Mammogram9/11/19 2.5 cm cyst right breast f/u in 1 year will need to reorder in futurept to call Ordered 2.5 right breast cyst  DEXA 04/03/12 osteopenia rec calcium 600 mg bid and on vit D3 5000 qd. Vit Dnormal on 5000 IU D3 qd -consider repeat DEXAorderedsch 07/25/20 mammo and dexa   Former smoker total 30 years max 1 ppd no FH lung cancer quit 20 years ago from 2019CXR ordered 2/2 wheezing  Hep C neg 04/09/16  H/o skin cancer Dr. Janit Bern seen for 2021 f/u in 2021 ln2 face, arms, hands appt upcoming 07/2020 or 08/2020   rec healthy diet and exercise   Urology Dr. Boneta Lucks est appt 10/2020 needs repeat US right kidney cysts   htn controlled on norvasc 2.5 qd to bid and maxzie 37.5-25 mg qd  Recurrent UTI - Plan: Urinalysis, Routine w reflex microscopic, Urine Culture Fu urology   Arthritis of back - Plan: Ambulatory referral to Orthopedic Surgery  Chronic right shoulder pain - Plan: Ambulatory referral to Orthopedic Surgery, DG Shoulder Right voltaren gel  Arthritis of knee  Lumbar herniated disc - Plan: Ambulatory referral to Orthopedic Surgery, traMADol (ULTRAM) 50 MG tablet  Abnormal MRI, lumbar spine - Plan: Ambulatory referral to Orthopedic Surgery, traMADol (ULTRAM) 50 MG tablet  Lumbar radiculopathy - Plan: Ambulatory referral to Orthopedic Surgery, traMADol (ULTRAM) 50 MG tablet  DDD (degenerative disc disease), lumbar - Plan: Ambulatory referral to Orthopedic Surgery  Pain of both hip joints - Plan: traMADol (ULTRAM) 50 MG tablet    Provider: Dr. Olivia Mackie McLean-Scocuzza-Internal Medicine

## 2020-07-20 NOTE — Patient Instructions (Addendum)
Hep B 3rd dose health department call  Consider additional doses pfizer dose 3 and 4  Consider colonoscopy [facility] or [facility] in the future   Dr. Harlow Mares knees Dr. Varney Baas   voltaren gel 4x per day   Shoulder Range of Motion Exercises Shoulder range of motion (ROM) exercises are done to keep the shoulder moving freely or to increase movement. They are often recommended for people who have shoulder pain or stiffness or who are recovering from a shoulder surgery. Phase 1 exercises When you are able, do this exercise 1-2 times per day for 30-60 seconds in each direction, or as directed by your health care provider. Pendulum exercise To do this exercise while sitting: 1. Sit in a chair or at the edge of your bed with your feet flat on the floor. 2. Let your affected arm hang down in front of you over the edge of the bed or chair. 3. Relax your shoulder, arm, and hand. [place] your body so your arm gently swings in small circles. You can also use your unaffected arm to start the motion. 5. Repeat changing the direction of the circles, swinging your arm left and right, and swinging your arm forward and back. To do this exercise while standing: 1. Stand next to a sturdy chair or table, and hold on to it with your hand on your unaffected side. 2. Bend forward at the waist. 3. Bend your knees slightly. 4. Relax your shoulder, arm, and hand. 5. While keeping your shoulder relaxed, use body motion to swing your arm in small circles. 6. Repeat changing the direction of the circles, swinging your arm left and right, and swinging your arm forward and back. 7. Between exercises, stand up tall and take a short break to relax your lower back.   Phase 2 exercises Do these exercises 1-2 times per day or as told by your health care provider. Hold each stretch for 30 seconds, and repeat 3 times. Do the exercises with one or both arms as instructed by your health care provider. For  these exercises, sit at a table with your hand and arm supported by the table. A chair that slides easily or has wheels can be helpful. External rotation 1. Turn your chair so that your affected side is nearest to the table. 2. Place your forearm on the table to your side. Bend your elbow about 90 at the elbow (right angle) and place your hand palm facing down on the table. Your elbow should be about 6 inches away from your side. 3. Keeping your arm on the table, lean your body forward. Abduction 1. Turn your chair so that your affected side is nearest to the table. 2. Place your forearm and hand on the table so that your thumb points toward the ceiling and your arm is straight out to your side. 3. Slide your hand out to the side and away from you, using your unaffected arm to do the work. 4. To increase the stretch, you can slide your chair away from the table. Flexion: forward stretch 1. Sit facing the table. Place your hand and elbow on the table in front of you. 2. Slide your hand forward and away from you, using your unaffected arm to do the work. 3. To increase the stretch, you can slide your chair backward. Phase 3 exercises Do these exercises 1-2 times per day or as told by your health care provider. Hold each stretch for 30 seconds, and repeat  3 times. Do the exercises with one or both arms as instructed by your health care provider. Cross-body stretch: posterior capsule stretch 1. Lift your arm straight out in front of you. 2. Bend your arm 90 at the elbow (right angle) so your forearm moves across your body. 3. Use your other arm to gently pull the elbow across your body, toward your other shoulder. Wall climbs 1. Stand with your affected arm extended out to the side with your hand resting on a door frame. 2. Slide your hand slowly up the door frame. 3. To increase the stretch, step through the door frame. Keep your body upright and do not lean. Wand exercises You will need a  cane, a piece of PVC pipe, or a sturdy wooden dowel for wand exercises. Flexion To do this exercise while standing: 1. Hold the wand with both of your hands, palms down. 2. Using the other arm to help, lift your arms up and over your head, if able. 3. Push upward with your other arm to gently increase the stretch. To do this exercise while lying down: 1. Lie on your back with your elbows resting on the floor and the wand in both your hands. Your hands will be palm down, or pointing toward your feet. 2. Lift your hands toward the ceiling, using your unaffected arm to help if needed. 3. Bring your arms overhead as able, using your unaffected arm to help if needed. Internal rotation 1. Stand while holding the wand behind you with both hands. Your unaffected arm should be extended above your head with the arm of the affected side extended behind you at the level of your waist. The wand should be pointing straight up and down as you hold it. 2. Slowly pull the wand up behind your back by straightening the elbow of your unaffected arm and bending the elbow of your affected arm. External rotation 1. Lie on your back with your affected upper arm supported on a small pillow or rolled towel. When you first do this exercise, keep your upper arm close to your body. Over time, bring your arm up to a 90 angle out to the side. 2. Hold the wand across your stomach and with both hands palm up. Your elbow on your affected side should be bent at a 90 angle. 3. Use your unaffected side to help push your forearm away from you and toward the floor. Keep your elbow on your affected side bent at a 90 angle. Contact a health care provider if you have:  New or increasing pain.  New numbness, tingling, weakness, or discoloration in your arm or hand. This information is not intended to replace advice given to you by your health care provider. Make sure you discuss any questions you have with your health care  provider. Document Revised: 04/03/2017 Document Reviewed: 04/03/2017 Elsevier Patient Education  2021 [employer].  Shoulder Exercises Ask your health care provider which exercises are safe for you. Do exercises exactly as told by your health care provider and adjust them as directed. It is normal to feel mild stretching, pulling, tightness, or discomfort as you do these exercises. Stop right away if you feel sudden pain or your pain gets worse. Do not begin these exercises until told by your health care provider. Stretching exercises External rotation and abduction This exercise is sometimes called corner stretch. This exercise rotates your arm outward (external rotation) and moves your arm out from your body (abduction). 6. Stand in a  doorway with one of your feet slightly in front of the other. This is called a staggered stance. If you cannot reach your forearms to the door frame, stand facing a corner of a room. 7. Choose one of the following positions as told by your health care provider: ? Place your hands and forearms on the door frame above your head. ? Place your hands and forearms on the door frame at the height of your head. ? Place your hands on the door frame at the height of your elbows. 8. Slowly move your weight onto your front foot until you feel a stretch across your chest and in the front of your shoulders. Keep your head and chest upright and keep your abdominal muscles tight. 9. Hold for __________ seconds. 10. To release the stretch, shift your weight to your back foot. Repeat __________ times. Complete this exercise __________ times a day.   Extension, standing 8. Stand and hold a broomstick, a cane, or a similar object behind your back. ? Your hands should be a little wider than shoulder width apart. ? Your palms should face away from your back. 9. Keeping your elbows straight and your shoulder muscles relaxed, move the stick away from your body until you feel a  stretch in your shoulders (extension). ? Avoid shrugging your shoulders while you move the stick. Keep your shoulder blades tucked down toward the middle of your back. 10. Hold for __________ seconds. 11. Slowly return to the starting position. Repeat __________ times. Complete this exercise __________ times a day. Range-of-motion exercises Pendulum 4. Stand near a wall or a surface that you can hold onto for balance. 5. Bend at the waist and let your left / right arm hang straight down. Use your other arm to support you. Keep your back straight and do not lock your knees. 6. Relax your left / right arm and shoulder muscles, and move your hips and your trunk so your left / right arm swings freely. Your arm should swing because of the motion of your body, not because you are using your arm or shoulder muscles. 7. Keep moving your hips and trunk so your arm swings in the following directions, as told by your health care provider: ? Side to side. ? Forward and backward. ? In clockwise and counterclockwise circles. 8. Continue each motion for __________ seconds, or for as long as told by your health care provider. 9. Slowly return to the starting position. Repeat __________ times. Complete this exercise __________ times a day.   Shoulder flexion, standing 5. Stand and hold a broomstick, a cane, or a similar object. Place your hands a little more than shoulder width apart on the object. Your left / right hand should be palm up, and your other hand should be palm down. 6. Keep your elbow straight and your shoulder muscles relaxed. Push the stick up with your healthy arm to raise your left / right arm in front of your body, and then over your head until you feel a stretch in your shoulder (flexion). ? Avoid shrugging your shoulder while you raise your arm. Keep your shoulder blade tucked down toward the middle of your back. 7. Hold for __________ seconds. 8. Slowly return to the starting  position. Repeat __________ times. Complete this exercise __________ times a day.   Shoulder abduction, standing 4. Stand and hold a broomstick, a cane, or a similar object. Place your hands a little more than shoulder width apart on the object. Your  left / right hand should be palm up, and your other hand should be palm down. 5. Keep your elbow straight and your shoulder muscles relaxed. Push the object across your body toward your left / right side. Raise your left / right arm to the side of your body (abduction) until you feel a stretch in your shoulder. ? Do not raise your arm above shoulder height unless your health care provider tells you to do that. ? If directed, raise your arm over your head. ? Avoid shrugging your shoulder while you raise your arm. Keep your shoulder blade tucked down toward the middle of your back. 6. Hold for __________ seconds. 7. Slowly return to the starting position. Repeat __________ times. Complete this exercise __________ times a day. Internal rotation 4. Place your left / right hand behind your back, palm up. 5. Use your other hand to dangle an exercise band, a towel, or a similar object over your shoulder. Grasp the band with your left / right hand so you are holding on to both ends. 6. Gently pull up on the band until you feel a stretch in the front of your left / right shoulder. The movement of your arm toward the center of your body is called internal rotation. ? Avoid shrugging your shoulder while you raise your arm. Keep your shoulder blade tucked down toward the middle of your back. 7. Hold for __________ seconds. 8. Release the stretch by letting go of the band and lowering your hands. Repeat __________ times. Complete this exercise __________ times a day.   Strengthening exercises External rotation 4. Sit in a stable chair without armrests. 5. Secure an exercise band to a stable object at elbow height on your left / right side. 6. Place a soft  object, such as a folded towel or a small pillow, between your left / right upper arm and your body to move your elbow about 4 inches (10 cm) away from your side. 7. Hold the end of the exercise band so it is tight and there is no slack. 8. Keeping your elbow pressed against the soft object, slowly move your forearm out, away from your abdomen (external rotation). Keep your body steady so only your forearm moves. 9. Hold for __________ seconds. 10. Slowly return to the starting position. Repeat __________ times. Complete this exercise __________ times a day.   Shoulder abduction 4. Sit in a stable chair without armrests, or stand up. 5. Hold a __________ weight in your left / right hand, or hold an exercise band with both hands. 6. Start with your arms straight down and your left / right palm facing in, toward your body. 7. Slowly lift your left / right hand out to your side (abduction). Do not lift your hand above shoulder height unless your health care provider tells you that this is safe. ? Keep your arms straight. ? Avoid shrugging your shoulder while you do this movement. Keep your shoulder blade tucked down toward the middle of your back. 8. Hold for __________ seconds. 9. Slowly lower your arm, and return to the starting position. Repeat __________ times. Complete this exercise __________ times a day.   Shoulder extension 4. Sit in a stable chair without armrests, or stand up. 5. Secure an exercise band to a stable object in front of you so it is at shoulder height. 6. Hold one end of the exercise band in each hand. Your palms should face each other. 7. Straighten your elbows and lift  your hands up to shoulder height. 8. Step back, away from the secured end of the exercise band, until the band is tight and there is no slack. 9. Squeeze your shoulder blades together as you pull your hands down to the sides of your thighs (extension). Stop when your hands are straight down by your sides.  Do not let your hands go behind your body. 10. Hold for __________ seconds. 11. Slowly return to the starting position. Repeat __________ times. Complete this exercise __________ times a day. Shoulder row 3. Sit in a stable chair without armrests, or stand up. 4. Secure an exercise band to a stable object in front of you so it is at waist height. 5. Hold one end of the exercise band in each hand. Position your palms so that your thumbs are facing the ceiling (neutral position). 6. Bend each of your elbows to a 90-degree angle (right angle) and keep your upper arms at your sides. 7. Step back until the band is tight and there is no slack. 8. Slowly pull your elbows back behind you. 9. Hold for __________ seconds. 10. Slowly return to the starting position. Repeat __________ times. Complete this exercise __________ times a day. Shoulder press-ups 4. Sit in a stable chair that has armrests. Sit upright, with your feet flat on the floor. 5. Put your hands on the armrests so your elbows are bent and your fingers are pointing forward. Your hands should be about even with the sides of your body. 6. Push down on the armrests and use your arms to lift yourself off the chair. Straighten your elbows and lift yourself up as much as you comfortably can. ? Move your shoulder blades down, and avoid letting your shoulders move up toward your ears. ? Keep your feet on the ground. As you get stronger, your feet should support less of your body weight as you lift yourself up. 7. Hold for __________ seconds. 8. Slowly lower yourself back into the chair. Repeat __________ times. Complete this exercise __________ times a day.   Wall push-ups 1. Stand so you are facing a stable wall. Your feet should be about one arm-length away from the wall. 2. Lean forward and place your palms on the wall at shoulder height. 3. Keep your feet flat on the floor as you bend your elbows and lean forward toward the wall. 4. Hold  for __________ seconds. 5. Straighten your elbows to push yourself back to the starting position. Repeat __________ times. Complete this exercise __________ times a day.   This information is not intended to replace advice given to you by your health care provider. Make sure you discuss any questions you have with your health care provider. Document Revised: 06/13/2018 Document Reviewed: 03/21/2018 Elsevier Patient Education  2021 [employer].

## 2020-07-20 NOTE — Addendum Note (Signed)
Addended by: Leeanne Rio on: 07/20/2020 10:03 AM   Modules accepted: Orders

## 2020-07-20 NOTE — Addendum Note (Signed)
Addended by: Leeanne Rio on: 07/20/2020 10:05 AM   Modules accepted: Orders

## 2020-07-25 ENCOUNTER — Other Ambulatory Visit: Payer: Self-pay

## 2020-07-25 ENCOUNTER — Ambulatory Visit
Admission: RE | Admit: 2020-07-25 | Discharge: 2020-07-25 | Disposition: A | Discharge status: Home / Self Care | Payer: Medicare HMO | Source: Ambulatory Visit | Attending: Internal Medicine | Admitting: Internal Medicine

## 2020-07-25 DIAGNOSIS — E2839 Other primary ovarian failure: Secondary | ICD-10-CM | POA: Insufficient documentation

## 2020-07-25 DIAGNOSIS — Z1231 Encounter for screening mammogram for malignant neoplasm of breast: Secondary | ICD-10-CM

## 2020-07-25 DIAGNOSIS — Z780 Asymptomatic menopausal state: Secondary | ICD-10-CM | POA: Diagnosis not present

## 2020-07-25 DIAGNOSIS — M85851 Other specified disorders of bone density and structure, right thigh: Secondary | ICD-10-CM | POA: Diagnosis not present

## 2020-07-25 DIAGNOSIS — M8580 Other specified disorders of bone density and structure, unspecified site: Secondary | ICD-10-CM | POA: Diagnosis not present

## 2020-07-27 DIAGNOSIS — D485 Neoplasm of uncertain behavior of skin: Secondary | ICD-10-CM | POA: Diagnosis not present

## 2020-07-27 DIAGNOSIS — L570 Actinic keratosis: Secondary | ICD-10-CM | POA: Diagnosis not present

## 2020-07-27 DIAGNOSIS — L218 Other seborrheic dermatitis: Secondary | ICD-10-CM | POA: Diagnosis not present

## 2020-07-27 DIAGNOSIS — D225 Melanocytic nevi of trunk: Secondary | ICD-10-CM | POA: Diagnosis not present

## 2020-07-27 DIAGNOSIS — L578 Other skin changes due to chronic exposure to nonionizing radiation: Secondary | ICD-10-CM | POA: Diagnosis not present

## 2020-07-27 DIAGNOSIS — M7541 Impingement syndrome of right shoulder: Secondary | ICD-10-CM | POA: Diagnosis not present

## 2020-07-27 DIAGNOSIS — L821 Other seborrheic keratosis: Secondary | ICD-10-CM | POA: Diagnosis not present

## 2020-08-05 DIAGNOSIS — M25511 Pain in right shoulder: Secondary | ICD-10-CM | POA: Diagnosis not present

## 2020-08-05 DIAGNOSIS — R293 Abnormal posture: Secondary | ICD-10-CM | POA: Diagnosis not present

## 2020-08-05 DIAGNOSIS — M5459 Other low back pain: Secondary | ICD-10-CM | POA: Diagnosis not present

## 2020-08-05 DIAGNOSIS — M546 Pain in thoracic spine: Secondary | ICD-10-CM | POA: Diagnosis not present

## 2020-08-10 DIAGNOSIS — M546 Pain in thoracic spine: Secondary | ICD-10-CM | POA: Diagnosis not present

## 2020-08-10 DIAGNOSIS — M25511 Pain in right shoulder: Secondary | ICD-10-CM | POA: Diagnosis not present

## 2020-08-10 DIAGNOSIS — R293 Abnormal posture: Secondary | ICD-10-CM | POA: Diagnosis not present

## 2020-08-10 DIAGNOSIS — M5459 Other low back pain: Secondary | ICD-10-CM | POA: Diagnosis not present

## 2020-08-15 DIAGNOSIS — R293 Abnormal posture: Secondary | ICD-10-CM | POA: Diagnosis not present

## 2020-08-15 DIAGNOSIS — M5459 Other low back pain: Secondary | ICD-10-CM | POA: Diagnosis not present

## 2020-08-15 DIAGNOSIS — M25511 Pain in right shoulder: Secondary | ICD-10-CM | POA: Diagnosis not present

## 2020-08-15 DIAGNOSIS — M546 Pain in thoracic spine: Secondary | ICD-10-CM | POA: Diagnosis not present

## 2020-08-17 DIAGNOSIS — M546 Pain in thoracic spine: Secondary | ICD-10-CM | POA: Diagnosis not present

## 2020-08-17 DIAGNOSIS — R293 Abnormal posture: Secondary | ICD-10-CM | POA: Diagnosis not present

## 2020-08-17 DIAGNOSIS — M5459 Other low back pain: Secondary | ICD-10-CM | POA: Diagnosis not present

## 2020-08-17 DIAGNOSIS — M25511 Pain in right shoulder: Secondary | ICD-10-CM | POA: Diagnosis not present

## 2020-08-23 DIAGNOSIS — M5459 Other low back pain: Secondary | ICD-10-CM | POA: Diagnosis not present

## 2020-08-23 DIAGNOSIS — R293 Abnormal posture: Secondary | ICD-10-CM | POA: Diagnosis not present

## 2020-08-23 DIAGNOSIS — M546 Pain in thoracic spine: Secondary | ICD-10-CM | POA: Diagnosis not present

## 2020-08-23 DIAGNOSIS — M25511 Pain in right shoulder: Secondary | ICD-10-CM | POA: Diagnosis not present

## 2020-08-26 DIAGNOSIS — R293 Abnormal posture: Secondary | ICD-10-CM | POA: Diagnosis not present

## 2020-08-26 DIAGNOSIS — M546 Pain in thoracic spine: Secondary | ICD-10-CM | POA: Diagnosis not present

## 2020-08-26 DIAGNOSIS — M25511 Pain in right shoulder: Secondary | ICD-10-CM | POA: Diagnosis not present

## 2020-08-26 DIAGNOSIS — M5459 Other low back pain: Secondary | ICD-10-CM | POA: Diagnosis not present

## 2020-09-02 DIAGNOSIS — M545 Low back pain, unspecified: Secondary | ICD-10-CM | POA: Diagnosis not present

## 2020-09-15 DIAGNOSIS — H02886 Meibomian gland dysfunction of left eye, unspecified eyelid: Secondary | ICD-10-CM | POA: Diagnosis not present

## 2020-09-15 DIAGNOSIS — H02883 Meibomian gland dysfunction of right eye, unspecified eyelid: Secondary | ICD-10-CM | POA: Diagnosis not present

## 2020-09-15 DIAGNOSIS — H2513 Age-related nuclear cataract, bilateral: Secondary | ICD-10-CM | POA: Diagnosis not present

## 2020-09-15 DIAGNOSIS — D3131 Benign neoplasm of right choroid: Secondary | ICD-10-CM | POA: Diagnosis not present

## 2020-09-21 DIAGNOSIS — R262 Difficulty in walking, not elsewhere classified: Secondary | ICD-10-CM | POA: Diagnosis not present

## 2020-09-21 DIAGNOSIS — M5459 Other low back pain: Secondary | ICD-10-CM | POA: Diagnosis not present

## 2020-09-26 DIAGNOSIS — R262 Difficulty in walking, not elsewhere classified: Secondary | ICD-10-CM | POA: Diagnosis not present

## 2020-09-26 DIAGNOSIS — M5459 Other low back pain: Secondary | ICD-10-CM | POA: Diagnosis not present

## 2020-09-30 DIAGNOSIS — M5459 Other low back pain: Secondary | ICD-10-CM | POA: Diagnosis not present

## 2020-09-30 DIAGNOSIS — R262 Difficulty in walking, not elsewhere classified: Secondary | ICD-10-CM | POA: Diagnosis not present

## 2020-10-24 ENCOUNTER — Other Ambulatory Visit: Payer: Self-pay | Admitting: Internal Medicine

## 2020-10-24 DIAGNOSIS — M5126 Other intervertebral disc displacement, lumbar region: Secondary | ICD-10-CM

## 2020-10-24 DIAGNOSIS — M5416 Radiculopathy, lumbar region: Secondary | ICD-10-CM

## 2020-10-25 ENCOUNTER — Other Ambulatory Visit: Payer: Self-pay

## 2020-10-25 NOTE — Telephone Encounter (Signed)
Pt called into the office asking for a refill of Methocarbamol. Medication was pended by Dr. Olivia Mackie. Medication has been signed and sent to pharmacy.

## 2020-11-17 ENCOUNTER — Telehealth: Payer: Self-pay | Admitting: Pharmacist

## 2020-11-17 NOTE — Telephone Encounter (Signed)
This patient is appearing on the insurance-provided list for being at risk of failing the adherence measure for cholesterol medications this calendar year. Reviewed up to date fill history - most recent fill 11/11/2020. No action needed at this time.

## 2020-12-23 ENCOUNTER — Telehealth: Payer: Self-pay | Admitting: Internal Medicine

## 2020-12-23 ENCOUNTER — Other Ambulatory Visit: Payer: Self-pay | Admitting: Internal Medicine

## 2020-12-23 DIAGNOSIS — M5416 Radiculopathy, lumbar region: Secondary | ICD-10-CM

## 2020-12-23 DIAGNOSIS — M25551 Pain in right hip: Secondary | ICD-10-CM

## 2020-12-23 DIAGNOSIS — M5126 Other intervertebral disc displacement, lumbar region: Secondary | ICD-10-CM

## 2020-12-23 DIAGNOSIS — R937 Abnormal findings on diagnostic imaging of other parts of musculoskeletal system: Secondary | ICD-10-CM

## 2020-12-23 NOTE — Telephone Encounter (Signed)
RX Refill: tramadol Last Seen: 07-20-20 Last Ordered: 07-20-20 Next Appt: 01-31-21

## 2020-12-23 NOTE — Telephone Encounter (Signed)
Patient is requesting a refill on her traMADol (ULTRAM) 50 MG tablet. 

## 2020-12-26 ENCOUNTER — Other Ambulatory Visit: Payer: Self-pay | Admitting: Internal Medicine

## 2021-01-09 ENCOUNTER — Ambulatory Visit (INDEPENDENT_AMBULATORY_CARE_PROVIDER_SITE_OTHER): Payer: Medicare HMO

## 2021-01-09 VITALS — Ht 65.0 in | Wt 224.0 lb

## 2021-01-09 DIAGNOSIS — Z0000 Encounter for general adult medical examination without abnormal findings: Secondary | ICD-10-CM | POA: Diagnosis not present

## 2021-01-09 NOTE — Progress Notes (Signed)
Subjective:   Cynthia Bryan is a 75 y.o. female who presents for Medicare Annual (Subsequent) preventive examination.  Review of Systems    No ROS.  Medicare Wellness Virtual Visit.  Visual/audio telehealth visit, UTA vital signs.   See social history for additional risk factors.   Cardiac Risk Factors include: advanced age (>32men, >24 women)     Objective:    Today's Vitals   01/09/21 0949  Weight: 224 lb (101.6 kg)  Height: 5\' 5"  (1.651 m)   Body mass index is 37.28 kg/m.  Advanced Directives 01/09/2021 02/09/2020 01/07/2020 01/06/2019 12/31/2017 09/19/2017 07/31/2016  Does Patient Have a Medical Advance Directive? Yes Yes Yes Yes Yes Yes No  Type of Paramedic of [place];Living will - - [place];Living will [place];Living will [place];Living will -  Does patient want to make changes to medical advance directive? No - Patient declined - No - Patient declined No - Patient declined No - Patient declined No - Patient declined -  Copy of [place] in Chart? No - copy requested - - No - copy requested No - copy requested No - copy requested -  Would patient like information on creating a medical advance directive? - - - - - - No - Patient declined    Current Medications (verified) Outpatient Encounter Medications as of 01/09/2021  Medication Sig   acetaminophen (TYLENOL) 500 MG tablet Take 1,000 mg by mouth 2 (two) times daily.    allopurinol (ZYLOPRIM) 300 MG tablet Take 1 tablet (300 mg total) by mouth daily.   amLODipine (NORVASC) 2.5 MG tablet Take 1-2 tablets (2.5-5 mg total) by mouth daily. Start with 2.5 mg dose if BP >130/>80 take 2 pills =5 mg   atorvastatin (LIPITOR) 40 MG tablet Take 1 tablet (40 mg total) by mouth daily at 6 PM.   Cholecalciferol (VITAMIN D3) 5000 units CAPS Take 5,000 Units by mouth daily.    Cyanocobalamin (B-12) 5000 MCG CAPS Take 5,000 mcg by mouth  daily.    ezetimibe (ZETIA) 10 MG tablet Take 1 tablet (10 mg total) by mouth at bedtime.   gabapentin (NEURONTIN) 300 MG capsule Take 1 capsule (300 mg total) by mouth at bedtime.   ketoconazole (NIZORAL) 2 % cream Apply 1 application topically 2 (two) times daily as needed for irritation. FACE   Magnesium 500 MG CAPS Take 500 mg by mouth at bedtime.    methocarbamol (ROBAXIN) 750 MG tablet TAKE ONE TABLET BY MOUTH EVERY NIGHT AT BEDTIME AS NEEDED MUSCLE SPASMS   pantoprazole (PROTONIX) 40 MG tablet Take 1 tablet (40 mg total) by mouth at bedtime.   potassium chloride (MICRO-K) 10 MEQ CR capsule Take 2 capsules (20 mEq total) by mouth every morning.   traMADol (ULTRAM) 50 MG tablet TAKE TWO TABLETS BY MOUTH THREE TIMES A DAY AS NEEDED   triamterene-hydrochlorothiazide (MAXZIDE-25) 37.5-25 MG tablet Take 1 tablet by mouth every morning.   [DISCONTINUED] clobetasol (TEMOVATE) 0.05 % external solution Apply 1 application topically 2 (two) times daily. Prn scalp   No facility-administered encounter medications on file as of 01/09/2021.    Allergies (verified) Nortriptyline and Sulfa antibiotics   History: Past Medical History:  Diagnosis Date   Arthritis    Basal cell carcinoma    right temple Dr. Elmer Ramp removed 01/2017    Chicken pox    COVID-19    10/13/19   Emphysema of lung ([place])  h/o bronchitis not emphysema   Fatty liver    09/19/15   Gastric ulcer    neg H pylori    GERD (gastroesophageal reflux disease)    Gout    Hiatal hernia    SMALL   History of kidney stones    Hyperlipidemia    Hypertension    Kidney stone    noted CT ab/pelvis 09/19/15 left kidney    Kidney stone    Dr. Rogers Blocker 2019    Lumbar herniated disc    Recurrent UTI    Syncope    per pt presyncope no LOC felt dizzy/vision distorted and couldnt speak EEG neg, CT head neg 07/31/16, f/u [facility] cardiology with US carotid, echo but no Holter    Umbilical hernia    noted 09/19/15    UTI (urinary tract  infection)    Past Surgical History:  Procedure Laterality Date   BREAST BIOPSY Right 1980   cyst removed negative pathology    CYSTOSCOPY W/ RETROGRADES Left 02/18/2020   Procedure: CYSTOSCOPY WITH RETROGRADE PYELOGRAM;  Surgeon: Royston Cowper, MD;  Location: [facility] ORS;  Service: Urology;  Laterality: Left;   EXTRACORPOREAL SHOCK WAVE LITHOTRIPSY Left 09/19/2017   Procedure: EXTRACORPOREAL SHOCK WAVE LITHOTRIPSY (ESWL);  Surgeon: Royston Cowper, MD;  Location: [facility] ORS;  Service: Urology;  Laterality: Left;   JOINT REPLACEMENT     right knee 11/2015 Dr. Janell Quiet [facility] [place]   Family History  Problem Relation Age of Onset   Breast cancer Mother 41   Cancer Mother        breast cancer s/p masectomy lived to be 46    Hypertension Mother    Cancer Father        NMSC skin cancer    Gout Father    Heart disease Father        s/p bypass surgery age 48 lived 68 months after surgery    Kidney disease Father        RAS>kidney failure    Hyperlipidemia Other    Stroke Other    Kidney cancer Neg Hx    Bladder Cancer Neg Hx    Prolactinoma Neg Hx    Social History   Socioeconomic History   Marital status: Widowed    Spouse name: Not on file   Number of children: Not on file   Years of education: Not on file   Highest education level: Not on file  Occupational History   Not on file  Tobacco Use   Smoking status: Former    Packs/day: 1.00    Years: 25.00    Pack years: 25.00    Types: Cigarettes    Quit date: 02/08/1994    Years since quitting: 26.9   Smokeless tobacco: Never   Tobacco comments:    smoked total 30 years quit 20 years ago from 2019 max 1 ppd no FH lung cancer   Vaping Use   Vaping Use: Never used  Substance and Sexual Activity   Alcohol use: No   Drug use: Never   Sexual activity: Not on file  Other Topics Concern   Not on file  Social History Narrative   2 years college    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   Social Determinants of  Radio broadcast assistant Strain: Low Risk    Difficulty of Paying Living Expenses: Not hard at [place]: No [place]   Worried About Estate manager/land agent of  Food in the Last Year: Never true   [place] in the Last Year: Never true  Transportation Needs: No Transportation Needs   Lack of Transportation (Medical): No   Lack of Transportation (Non-Medical): No  Physical Activity: Sufficiently Active   Days of Exercise per Week: 5 days   Minutes of Exercise per Session: 30 min  Stress: No Stress Concern Present   Feeling of Stress : Not at all  Social Connections: Unknown   Frequency of Communication with Friends and Family: More than three times a week   Frequency of Social Gatherings with Friends and Family: More than three times a week   Attends Religious Services: More than 4 times per year   Active Member of [employer] or Organizations: Yes   Attends Music therapist: More than 4 times per year   Marital Status: Not on file    Tobacco Counseling Counseling given: Not Answered Tobacco comments: smoked total 30 years quit 20 years ago from 2019 max 1 ppd no FH lung cancer    Clinical Intake:  Pre-visit preparation completed: Yes        Diabetes: No  How often do you need to have someone help you when you read instructions, pamphlets, or other written materials from your doctor or pharmacy?: 1 - Never    Interpreter Needed?: No      Activities of Daily Living In your present state of health, do you have any difficulty performing the following activities: 01/09/2021 02/09/2020  Hearing? N N  Vision? N N  Difficulty concentrating or making decisions? N N  Walking or climbing stairs? N N  Dressing or bathing? N N  Doing errands, shopping? N N  Preparing Food and eating ? N -  Using the Toilet? N -  In the past six months, have you accidently leaked urine? N -  Do you have problems with loss of bowel control? N -  Managing your  Medications? N -  Managing your Finances? N -  Housekeeping or managing your Housekeeping? N -  Some recent data might be hidden    Patient Care Team: McLean-Scocuzza, Nino Glow, MD as PCP - General (Internal Medicine)  Indicate any recent Medical Services you may have received from other than [facility] providers in the past year (date may be approximate).     Assessment:   This is a routine wellness examination for Cynthia Bryan.  Virtual Visit via Telephone Note  I connected with  Cynthia Bryan on 01/09/21 at  9:45 AM EST by telephone and verified that I am speaking with the correct person using two identifiers.  Location: Patient: home Provider: office Persons participating in the virtual visit: patient/Nurse Health Advisor   I discussed the limitations, risks, security and privacy concerns of performing an evaluation and management service by telephone and the availability of in person appointments. The patient expressed understanding and agreed to proceed.  Interactive audio and video telecommunications were attempted between this nurse and patient, however failed, due to patient having technical difficulties OR patient did not have access to video capability.  We continued and completed visit with audio only.  Some vital signs may be absent or patient reported.   OBrien-Blaney, Chamika Cunanan L, LPN   Hearing/Vision screen Hearing Screening - Comments:: Patient is able to hear conversational tones without difficulty. No issues reported. Vision Screening - Comments:: Followed by Dr. Ellin Mayhew Wears corrective lenses They have annual follow up with the ophthalmologist  Dietary issues and exercise  activities discussed: Current Exercise Habits: Home exercise routine, Type of exercise: walking, Intensity: Mild Regular diet Good water intake   Goals Addressed               This Visit's Progress     Patient Stated     Maintain Healthy Lifestyle (pt-stated)        Stay active Healthy  diet       Depression Screen PHQ 2/9 Scores 01/09/2021 01/07/2020 09/16/2019 01/06/2019 01/02/2019 12/31/2017 07/03/2017  PHQ - 2 Score 0 0 0 0 0 0 0    Fall Risk Fall Risk  01/09/2021 07/20/2020 02/17/2020 01/07/2020 10/26/2019  Falls in the past year? 0 0 0 0 0  Number falls in past yr: 0 0 0 0 -  Injury with Fall? - 0 0 - -  Follow up Falls evaluation completed Falls evaluation completed Falls evaluation completed Falls evaluation completed Falls evaluation completed    [place]: Adequate lighting in your home to reduce risk of falls? Yes   ASSISTIVE DEVICES UTILIZED TO PREVENT FALLS: Use of a cane, walker or w/c? No   TIMED UP AND GO: Was the test performed? No . Virtual visit.  Cognitive Function: Patient is alert and oriented x3.  MMSE - Mini Mental State Exam 12/31/2017  Orientation to time 5  Orientation to Place 5  Registration 3  Attention/ Calculation 5  Recall 3  Language- name 2 objects 2  Language- repeat 1  Language- follow 3 step command 3  Language- read & follow direction 1  Write a sentence 1  Copy design 1  Total score 30     6CIT Screen 01/09/2021 01/06/2019  What Year? 0 points 0 points  What month? 0 points 0 points  What time? 0 points 0 points  Count back from 20 - 0 points  Months in reverse 0 points 0 points    Immunizations Immunization History  Administered Date(s) Administered   Hepatitis A 08/25/2003, 03/19/2017   Hepatitis A, Adult 08/25/2003, 03/19/2017   Hepatitis B 03/19/2017, 04/22/2017   Hepatitis B, adult 03/19/2017, 04/22/2017   Influenza, High Dose Seasonal PF 12/07/2014, 01/10/2017, 11/18/2018, 12/17/2019, 12/17/2020   Influenza,inj,Quad PF,6+ Mos 12/30/2013, 04/09/2016   Influenza,inj,quad, With Preservative 12/04/2014   Influenza-Unspecified 03/17/2012, 12/30/2013, 04/09/2016, 10/15/2017   PFIZER(Purple Top)SARS-COV-2 Vaccination 05/28/2019, 06/23/2019   Pneumococcal Conjugate-13  05/11/2013, 10/08/2018   Pneumococcal Polysaccharide-23 04/09/2016   Pneumococcal-Unspecified 03/05/2014   Td 09/17/2003   Tdap 05/11/2013, 10/08/2018   Zoster Recombinat (Shingrix) 08/30/2019   Zoster, Live 05/16/2002   Qualifies for Shingles Vaccine? Yes   Zostavax completed Yes   Shingrix Completed?: No.    Education has been provided regarding the importance of this vaccine. Patient has been advised to call insurance company to determine out of pocket expense if they have not yet received this vaccine. Advised may also receive vaccine at local pharmacy or Health Dept. Verbalized acceptance and understanding.  Screening Tests Health Maintenance  Topic Date Due   COVID-19 Vaccine (3 - Pfizer risk series) 01/25/2021 (Originally 07/21/2019)   Zoster Vaccines- Shingrix (2 of 2) 04/11/2021 (Originally 10/25/2019)   MAMMOGRAM  07/26/2022   Fecal DNA (Cologuard)  09/29/2022   TETANUS/TDAP  10/07/2028   Pneumonia Vaccine 67+ Years old  Completed   INFLUENZA VACCINE  Completed   DEXA SCAN  Completed   Hepatitis C Screening  Completed   HPV VACCINES  Aged Out    Health Maintenance  There are no preventive  care reminders to display for this patient.  Vision Screening: Recommended annual ophthalmology exams for early detection of glaucoma and other disorders of the eye.  Dental Screening: Recommended annual dental exams for proper oral hygiene  Community Resource Referral / Chronic Care Management: CRR required this visit?  No   CCM required this visit?  No      Plan:   Keep all routine maintenance appointments.   I have personally reviewed and noted the following in the patient's chart:   Medical and social history Use of alcohol, tobacco or illicit drugs  Current medications and supplements including opioid prescriptions. Taking Ultram, followed by PCP.  Functional ability and status Nutritional status Physical activity Advanced directives List of other  physicians Hospitalizations, surgeries, and ER visits in previous 12 months Vitals Screenings to include cognitive, depression, and falls Referrals and appointments  In addition, I have reviewed and discussed with patient certain preventive protocols, quality metrics, and best practice recommendations. A written personalized care plan for preventive services as well as general preventive health recommendations were provided to patient.     Varney Biles, LPN   16/03/958

## 2021-01-09 NOTE — Patient Instructions (Addendum)
Cynthia Bryan , Thank you for taking time to come for your Medicare Wellness Visit. I appreciate your ongoing commitment to your health goals. Please review the following plan we discussed and let me know if I can assist you in the future.   These are the goals we discussed:  Goals       Patient Stated     Maintain Healthy Lifestyle (pt-stated)      Stay active Healthy diet        This is a list of the screening recommended for you and due dates:  Health Maintenance  Topic Date Due   COVID-19 Vaccine (3 - Pfizer risk series) 01/25/2021*   Zoster (Shingles) Vaccine (2 of 2) 04/11/2021*   Mammogram  07/26/2022   Cologuard (Stool DNA test)  09/29/2022   Tetanus Vaccine  10/07/2028   Pneumonia Vaccine  Completed   Flu Shot  Completed   DEXA scan (bone density measurement)  Completed   Hepatitis C Screening: USPSTF Recommendation to screen - Ages 51-79 yo.  Completed   HPV Vaccine  Aged Out  *Topic was postponed. The date shown is not the original due date.    Advanced directives: End of life planning; Advance aging; Advanced directives discussed.  Copy of current HCPOA/Living Will requested.    Conditions/risks identified: none new  Follow up in one year for your annual wellness visit    Preventive Care 65 Years and Older, Female Preventive care refers to lifestyle choices and visits with your health care provider that can promote health and wellness. What does preventive care include? A yearly physical exam. This is also called an annual well check. Dental exams once or twice a year. Routine eye exams. Ask your health care provider how often you should have your eyes checked. Personal lifestyle choices, including: Daily care of your teeth and gums. Regular physical activity. Eating a healthy diet. Avoiding tobacco and drug use. Limiting alcohol use. Practicing safe sex. Taking low-dose aspirin every day. Taking vitamin and mineral supplements as recommended by your  health care provider. What happens during an annual well check? The services and screenings done by your health care provider during your annual well check will depend on your age, overall health, lifestyle risk factors, and family history of disease. Counseling  Your health care provider may ask you questions about your: Alcohol use. Tobacco use. Drug use. Emotional well-being. Home and relationship well-being. Sexual activity. Eating habits. History of falls. Memory and ability to understand (cognition). Work and work Statistician. Reproductive health. Screening  You may have the following tests or measurements: Height, weight, and BMI. Blood pressure. Lipid and cholesterol levels. These may be checked every 5 years, or more frequently if you are over 15 years old. Skin check. Lung cancer screening. You may have this screening every year starting at age 42 if you have a 30-pack-year history of smoking and currently smoke or have quit within the past 15 years. Fecal occult blood test (FOBT) of the stool. You may have this test every year starting at age 42. Flexible sigmoidoscopy or colonoscopy. You may have a sigmoidoscopy every 5 years or a colonoscopy every 10 years starting at age 63. Hepatitis C blood test. Hepatitis B blood test. Sexually transmitted disease (STD) testing. Diabetes screening. This is done by checking your blood sugar (glucose) after you have not eaten for a while (fasting). You may have this done every 1-3 years. Bone density scan. This is done to screen for osteoporosis. You may  have this done starting at age 75. Mammogram. This may be done every 1-2 years. Talk to your health care provider about how often you should have regular mammograms. Talk with your health care provider about your test results, treatment options, and if necessary, the need for more tests. Vaccines  Your health care provider may recommend certain vaccines, such as: Influenza vaccine.  This is recommended every year. Tetanus, diphtheria, and acellular pertussis (Tdap, Td) vaccine. You may need a Td booster every 10 years. Zoster vaccine. You may need this after age 58. Pneumococcal 13-valent conjugate (PCV13) vaccine. One dose is recommended after age 26. Pneumococcal polysaccharide (PPSV23) vaccine. One dose is recommended after age 31. Talk to your health care provider about which screenings and vaccines you need and how often you need them. This information is not intended to replace advice given to you by your health care provider. Make sure you discuss any questions you have with your health care provider. Document Released: 03/18/2015 Document Revised: 11/09/2015 Document Reviewed: 12/21/2014 Elsevier Interactive Patient Education  2017 [place] Prevention in the Home Falls can cause injuries. They can happen to people of all ages. There are many things you can do to make your home safe and to help prevent falls. What can I do on the outside of my home? Regularly fix the edges of walkways and driveways and fix any cracks. Remove anything that might make you trip as you walk through a door, such as a raised step or threshold. Trim any bushes or trees on the path to your home. Use bright outdoor lighting. Clear any walking paths of anything that might make someone trip, such as rocks or tools. Regularly check to see if handrails are loose or broken. Make sure that both sides of any steps have handrails. Any raised decks and porches should have guardrails on the edges. Have any leaves, snow, or ice cleared regularly. Use sand or salt on walking paths during winter. Clean up any spills in your garage right away. This includes oil or grease spills. What can I do in the bathroom? Use night lights. Install grab bars by the toilet and in the tub and shower. Do not use towel bars as grab bars. Use non-skid mats or decals in the tub or shower. If you need to sit  down in the shower, use a plastic, non-slip stool. Keep the floor dry. Clean up any water that spills on the floor as soon as it happens. Remove soap buildup in the tub or shower regularly. Attach bath mats securely with double-sided non-slip rug tape. Do not have throw rugs and other things on the floor that can make you trip. What can I do in the bedroom? Use night lights. Make sure that you have a light by your bed that is easy to reach. Do not use any sheets or blankets that are too big for your bed. They should not hang down onto the floor. Have a firm chair that has side arms. You can use this for support while you get dressed. Do not have throw rugs and other things on the floor that can make you trip. What can I do in the kitchen? Clean up any spills right away. Avoid walking on wet floors. Keep items that you use a lot in easy-to-reach places. If you need to reach something above you, use a strong step stool that has a grab bar. Keep electrical cords out of the way. Do not use floor polish  or wax that makes floors slippery. If you must use wax, use non-skid floor wax. Do not have throw rugs and other things on the floor that can make you trip. What can I do with my stairs? Do not leave any items on the stairs. Make sure that there are handrails on both sides of the stairs and use them. Fix handrails that are broken or loose. Make sure that handrails are as long as the stairways. Check any carpeting to make sure that it is firmly attached to the stairs. Fix any carpet that is loose or worn. Avoid having throw rugs at the top or bottom of the stairs. If you do have throw rugs, attach them to the floor with carpet tape. Make sure that you have a light switch at the top of the stairs and the bottom of the stairs. If you do not have them, ask someone to add them for you. What else can I do to help prevent falls? Wear shoes that: Do not have high heels. Have rubber bottoms. Are  comfortable and fit you well. Are closed at the toe. Do not wear sandals. If you use a stepladder: Make sure that it is fully opened. Do not climb a closed stepladder. Make sure that both sides of the stepladder are locked into place. Ask someone to hold it for you, if possible. Clearly mark and make sure that you can see: Any grab bars or handrails. First and last steps. Where the edge of each step is. Use tools that help you move around (mobility aids) if they are needed. These include: Canes. Walkers. Scooters. Crutches. Turn on the lights when you go into a dark area. Replace any light bulbs as soon as they burn out. Set up your furniture so you have a clear path. Avoid moving your furniture around. If any of your floors are uneven, fix them. If there are any pets around you, be aware of where they are. Review your medicines with your doctor. Some medicines can make you feel dizzy. This can increase your chance of falling. Ask your doctor what other things that you can do to help prevent falls. This information is not intended to replace advice given to you by your health care provider. Make sure you discuss any questions you have with your health care provider. Document Released: 12/16/2008 Document Revised: 07/28/2015 Document Reviewed: 03/26/2014 Elsevier Interactive Patient Education  2017 [place].  Opioid Pain Medicine Management Opioids are powerful medicines that are used to treat moderate to severe pain. When used for short periods of time, they can help you to: Sleep better. Do better in physical or occupational therapy. Feel better in the first few days after an injury. Recover from surgery. Opioids should be taken with the supervision of a trained health care provider. They should be taken for the shortest period of time possible. This is because opioids can be addictive, and the longer you take opioids, the greater your risk of addiction. This addiction can also be  called opioid use disorder. What are the risks? Using opioid pain medicines for longer than 3 days increases your risk of side effects. Side effects include: Constipation. Nausea and vomiting. Breathing difficulties (respiratory depression). Drowsiness. Confusion. Opioid use disorder. Itching. Taking opioid pain medicine for a long period of time can affect your ability to do daily tasks. It also puts you at risk for: Motor vehicle crashes. Depression. Suicide. Heart attack. Overdose, which can be life-threatening. What is a pain treatment plan?  A pain treatment plan is an agreement between you and your health care provider. Pain is unique to each person, and treatments vary depending on your condition. To manage your pain, you and your health care provider need to work together. To help you do this: Discuss the goals of your treatment, including how much pain you might expect to have and how you will manage the pain. Review the risks and benefits of taking opioid medicines. Remember that a good treatment plan uses more than one approach and minimizes the chance of side effects. Be honest about the amount of medicines you take and about any drug or alcohol use. Get pain medicine prescriptions from only one health care provider. Pain can be managed with many types of alternative treatments. Ask your health care provider to refer you to one or more specialists who can help you manage pain through: Physical or occupational therapy. Counseling (cognitive behavioral therapy). Good nutrition. Biofeedback. Massage. Meditation. Non-opioid medicine. Following a gentle exercise program. How to use opioid pain medicine Taking medicine Take your pain medicine exactly as told by your health care provider. Take it only when you need it. If your pain gets less severe, you may take less than your prescribed dose if your health care provider approves. If you are not having pain, do nottake pain  medicine unless your health care provider tells you to take it. If your pain is severe, do nottry to treat it yourself by taking more pills than instructed on your prescription. Contact your health care provider for help. Write down the times when you take your pain medicine. It is easy to become confused while on pain medicine. Writing the time can help you avoid overdose. Take other over-the-counter or prescription medicines only as told by your health care provider. Keeping yourself and others safe  While you are taking opioid pain medicine: Do not drive, use machinery, or power tools. Do not sign legal documents. Do not drink alcohol. Do not take sleeping pills. Do not supervise children by yourself. Do not do activities that require climbing or being in high places. Do not go to a lake, river, ocean, spa, or swimming pool. Do not share your pain medicine with anyone. Keep pain medicine in a locked cabinet or in a secure area where pets and children cannot reach it. Stopping your use of opioids If you have been taking opioid medicine for more than a few weeks, you may need to slowly decrease (taper) how much you take until you stop completely. Tapering your use of opioids can decrease your risk of symptoms of withdrawal, such as: Pain and cramping in the abdomen. Nausea. Sweating. Sleepiness. Restlessness. Uncontrollable shaking (tremors). Cravings for the medicine. Do not attempt to taper your use of opioids on your own. Talk with your health care provider about how to do this. Your health care provider may prescribe a step-down schedule based on how much medicine you are taking and how long you have been taking it. Getting rid of leftover pills Do not save any leftover pills. Get rid of leftover pills safely by: Taking the medicine to a prescription take-back program. This is usually offered by the county or law enforcement. Bringing them to a pharmacy that has a drug disposal  container. Flushing them down the toilet. Check the label or package insert of your medicine to see whether this is safe to do. Throwing them out in the trash. Check the label or package insert of your medicine to see  whether this is safe to do. If it is safe to throw it out, remove the medicine from the original container, put it into a sealable bag or container, and mix it with used coffee grounds, food scraps, dirt, or cat litter before putting it in the trash. Follow these instructions at home: Activity Do exercises as told by your health care provider. Avoid activities that make your pain worse. Return to your normal activities as told by your health care provider. Ask your health care provider what activities are safe for you. General instructions You may need to take these actions to prevent or treat constipation: Drink enough fluid to keep your urine pale yellow. Take over-the-counter or prescription medicines. Eat foods that are high in fiber, such as beans, whole grains, and fresh fruits and vegetables. Limit foods that are high in fat and processed sugars, such as fried or sweet foods. Keep all follow-up visits. This is important. Where to find support If you have been taking opioids for a long time, you may benefit from receiving support for quitting from a local support group or counselor. Ask your health care provider for a referral to these resources in your area. Where to find more information Centers for Disease Control and Prevention (CDC): http://www.wolf.info/ [place] Food and Drug Administration (FDA): [employer] Get help right away if: You may have taken too much of an opioid (overdosed). Common symptoms of an overdose: Your breathing is slower or more shallow than normal. You have a very slow heartbeat (pulse). You have slurred speech. You have nausea and vomiting. Your pupils become very small. You have other potential symptoms: You are very confused. You faint or feel like  you will faint. You have cold, clammy skin. You have blue lips or fingernails. You have thoughts of harming yourself or harming others. These symptoms may represent a serious problem that is an emergency. Do not wait to see if the symptoms will go away. Get medical help right away. Call your local emergency services (911 in the [place] Do not drive yourself to the hospital.  If you ever feel like you may hurt yourself or others, or have thoughts about taking your own life, get help right away. Go to your nearest emergency department or: Call your local emergency services (911 in the [place] Call the [facility] [phone] in the [place] Call a suicide crisis helpline, such as the [place] at 581-774-4420 or 988 in the [place]. This is open 24 hours a day in the [place] Text the Crisis Text Line at 831-541-6643 (in the [place].). Summary Opioid medicines can help you manage moderate to severe pain for a short period of time. A pain treatment plan is an agreement between you and your health care provider. Discuss the goals of your treatment, including how much pain you might expect to have and how you will manage the pain. If you think that you or someone else may have taken too much of an opioid, get medical help right away. This information is not intended to replace advice given to you by your health care provider. Make sure you discuss any questions you have with your health care provider. Document Revised: 09/14/2020 Document Reviewed: 06/01/2020 Elsevier Patient Education  [place].

## 2021-01-31 ENCOUNTER — Ambulatory Visit (INDEPENDENT_AMBULATORY_CARE_PROVIDER_SITE_OTHER): Payer: Medicare HMO | Admitting: Internal Medicine

## 2021-01-31 ENCOUNTER — Encounter: Payer: Self-pay | Admitting: Internal Medicine

## 2021-01-31 ENCOUNTER — Other Ambulatory Visit: Payer: Self-pay

## 2021-01-31 VITALS — BP 138/88 | HR 77 | Temp 97.3°F | Ht 65.0 in | Wt 218.2 lb

## 2021-01-31 DIAGNOSIS — N390 Urinary tract infection, site not specified: Secondary | ICD-10-CM | POA: Diagnosis not present

## 2021-01-31 DIAGNOSIS — M5440 Lumbago with sciatica, unspecified side: Secondary | ICD-10-CM | POA: Diagnosis not present

## 2021-01-31 DIAGNOSIS — R937 Abnormal findings on diagnostic imaging of other parts of musculoskeletal system: Secondary | ICD-10-CM

## 2021-01-31 DIAGNOSIS — E785 Hyperlipidemia, unspecified: Secondary | ICD-10-CM

## 2021-01-31 DIAGNOSIS — E876 Hypokalemia: Secondary | ICD-10-CM | POA: Diagnosis not present

## 2021-01-31 DIAGNOSIS — I10 Essential (primary) hypertension: Secondary | ICD-10-CM

## 2021-01-31 DIAGNOSIS — M109 Gout, unspecified: Secondary | ICD-10-CM

## 2021-01-31 DIAGNOSIS — R195 Other fecal abnormalities: Secondary | ICD-10-CM | POA: Diagnosis not present

## 2021-01-31 DIAGNOSIS — M5126 Other intervertebral disc displacement, lumbar region: Secondary | ICD-10-CM | POA: Diagnosis not present

## 2021-01-31 DIAGNOSIS — R7303 Prediabetes: Secondary | ICD-10-CM

## 2021-01-31 DIAGNOSIS — M25551 Pain in right hip: Secondary | ICD-10-CM

## 2021-01-31 DIAGNOSIS — M25552 Pain in left hip: Secondary | ICD-10-CM

## 2021-01-31 DIAGNOSIS — M5416 Radiculopathy, lumbar region: Secondary | ICD-10-CM

## 2021-01-31 DIAGNOSIS — Z1231 Encounter for screening mammogram for malignant neoplasm of breast: Secondary | ICD-10-CM

## 2021-01-31 DIAGNOSIS — K219 Gastro-esophageal reflux disease without esophagitis: Secondary | ICD-10-CM | POA: Diagnosis not present

## 2021-01-31 DIAGNOSIS — Z1211 Encounter for screening for malignant neoplasm of colon: Secondary | ICD-10-CM

## 2021-01-31 LAB — COMPREHENSIVE METABOLIC PANEL WITH GFR
ALT: 21 U/L (ref 0–35)
AST: 14 U/L (ref 0–37)
Albumin: 4.4 g/dL (ref 3.5–5.2)
Alkaline Phosphatase: 91 U/L (ref 39–117)
BUN: 22 mg/dL (ref 6–23)
CO2: 28 meq/L (ref 19–32)
Calcium: 10 mg/dL (ref 8.4–10.5)
Chloride: 100 meq/L (ref 96–112)
Creatinine, Ser: 0.95 mg/dL (ref 0.40–1.20)
GFR: 59.5 mL/min — ABNORMAL LOW
Glucose, Bld: 103 mg/dL — ABNORMAL HIGH (ref 70–99)
Potassium: 3.8 meq/L (ref 3.5–5.1)
Sodium: 139 meq/L (ref 135–145)
Total Bilirubin: 0.7 mg/dL (ref 0.2–1.2)
Total Protein: 6.8 g/dL (ref 6.0–8.3)

## 2021-01-31 LAB — CBC WITH DIFFERENTIAL/PLATELET
Basophils Relative: 1 % (ref 0.0–3.0)
Eosinophils Relative: 4 % (ref 0.0–5.0)
HCT: 41.7 % (ref 36.0–46.0)
Hemoglobin: 13.5 g/dL (ref 12.0–15.0)
Lymphocytes Relative: 43 % (ref 12.0–46.0)
MCHC: 32.4 g/dL (ref 30.0–36.0)
MCV: 93.6 fl (ref 78.0–100.0)
Monocytes Relative: 5 % (ref 3.0–12.0)
Neutrophils Relative %: 47 % (ref 43.0–77.0)
Platelets: 223 10*3/uL (ref 150.0–400.0)
RBC: 4.46 Mil/uL (ref 3.87–5.11)
RDW: 16 % — ABNORMAL HIGH (ref 11.5–15.5)
WBC: 6.8 10*3/uL (ref 4.0–10.5)

## 2021-01-31 LAB — LIPID PANEL
Cholesterol: 157 mg/dL (ref 0–200)
HDL: 53 mg/dL
LDL Cholesterol: 64 mg/dL (ref 0–99)
NonHDL: 103.69
Total CHOL/HDL Ratio: 3
Triglycerides: 197 mg/dL — ABNORMAL HIGH (ref 0.0–149.0)
VLDL: 39.4 mg/dL (ref 0.0–40.0)

## 2021-01-31 LAB — URIC ACID: Uric Acid, Serum: 5.2 mg/dL (ref 2.4–7.0)

## 2021-01-31 LAB — HEMOGLOBIN A1C: Hgb A1c MFr Bld: 6.2 % (ref 4.6–6.5)

## 2021-01-31 MED ORDER — GABAPENTIN 300 MG PO CAPS: 300.0000 mg | ORAL_CAPSULE | Freq: Every day | ORAL | 3 refills | Status: DC

## 2021-01-31 MED ORDER — ALLOPURINOL 300 MG PO TABS: 300.0000 mg | ORAL_TABLET | Freq: Every day | ORAL | 3 refills | Status: AC

## 2021-01-31 MED ORDER — AMLODIPINE BESYLATE 2.5 MG PO TABS: 2.5000 mg | ORAL_TABLET | Freq: Every day | ORAL | 3 refills | Status: DC

## 2021-01-31 MED ORDER — ATORVASTATIN CALCIUM 40 MG PO TABS: 40.0000 mg | ORAL_TABLET | Freq: Every day | ORAL | 3 refills | Status: DC

## 2021-01-31 MED ORDER — METHOCARBAMOL 750 MG PO TABS: ORAL_TABLET | ORAL | 11 refills | Status: DC

## 2021-01-31 MED ORDER — KETOCONAZOLE 2 % EX CREA: 1.0000 "application " | TOPICAL_CREAM | Freq: Two times a day (BID) | CUTANEOUS | 11 refills | Status: DC | PRN

## 2021-01-31 MED ORDER — TRAMADOL HCL 50 MG PO TABS: ORAL_TABLET | ORAL | 5 refills | Status: DC

## 2021-01-31 MED ORDER — POTASSIUM CHLORIDE ER 10 MEQ PO CPCR: 20.0000 meq | ORAL_CAPSULE | ORAL | 3 refills | Status: DC

## 2021-01-31 MED ORDER — TRIAMTERENE-HCTZ 37.5-25 MG PO TABS: 1.0000 | ORAL_TABLET | ORAL | 3 refills | Status: DC

## 2021-01-31 MED ORDER — EZETIMIBE 10 MG PO TABS: 10.0000 mg | ORAL_TABLET | Freq: Every day | ORAL | 3 refills | Status: DC

## 2021-01-31 MED ORDER — PANTOPRAZOLE SODIUM 40 MG PO TBEC: 40.0000 mg | DELAYED_RELEASE_TABLET | Freq: Every day | ORAL | 3 refills | Status: DC

## 2021-01-31 NOTE — Patient Instructions (Addendum)
Consider pfizer boosters rec total 4-5 shots   Check with health dpet about hep A  and B vaccine please

## 2021-01-31 NOTE — Progress Notes (Signed)
Chief Complaint  Patient presents with   Follow-up   F/u  1. BP sl elevated today on norvasc 85m qd and maxzide 37.5-25 mg qd  2. Back pain improved since pyelonephritis txed  3. Gout on allopurinol 300 mg qd  4. Recurrent uti will check urine    Review of Systems  Constitutional:  Negative for weight loss.  HENT:  Negative for hearing loss.   Eyes:  Negative for blurred vision.  Respiratory:  Negative for shortness of breath.   Cardiovascular:  Negative for chest pain.  Gastrointestinal:  Negative for abdominal pain and blood in stool.  Genitourinary:  Negative for dysuria.  Musculoskeletal:  Negative for falls and joint pain.  Skin:  Negative for rash.  Neurological:  Negative for headaches.  Psychiatric/Behavioral:  Negative for depression.   Past Medical History:  Diagnosis Date   Arthritis    Basal cell carcinoma    right temple Dr. AElmer Rampremoved 01/2017    Chicken pox    COVID-19    10/13/19   [place]    h/o bronchitis not emphysema   Fatty liver    09/19/15   Gastric ulcer    neg H pylori    GERD (gastroesophageal reflux disease)    Gout    Hiatal hernia    SMALL   History of kidney stones    Hyperlipidemia    Hypertension    Kidney stone    noted CT ab/pelvis 09/19/15 left kidney    Kidney stone    Dr. WRogers Blocker2019    Lumbar herniated disc    Recurrent UTI    Syncope    per pt presyncope no LOC felt dizzy/vision distorted and couldnt speak EEG neg, CT head neg 07/31/16, f/u [facility]ology with UKoreacarotid, echo but no Holter    Umbilical hernia    noted 09/19/15    UTI (urinary tract infection)    Past Surgical History:  Procedure Laterality Date   BREAST BIOPSY Right 1980   cyst removed negative pathology    CYSTOSCOPY W/ RETROGRADES Left 02/18/2020   Procedure: CYSTOSCOPY WITH RETROGRADE PYELOGRAM;  Surgeon: WRoyston Cowper MD;  Location: [facility] ORS;  Service: Urology;  Laterality: Left;   EXTRACORPOREAL SHOCK WAVE LITHOTRIPSY  Left 09/19/2017   Procedure: EXTRACORPOREAL SHOCK WAVE LITHOTRIPSY (ESWL);  Surgeon: WRoyston Cowper MD;  Location: [facility] ORS;  Service: Urology;  Laterality: Left;   JOINT REPLAC[facility]     [facility]   Family History  Problem Relation Age of Onset   Breast cancer Mother 763  Cancer Mother        breast cancer s/p masectomy lived to be 922   Hypertension Mother    Cancer Father        NMSC skin cancer    Gout Father    Heart disease Father        s/p bypass surgery age 4950lived 536 monthsafter surgery    Kidney disease Father        RAS>kidney failure    Hyperlipidemia Other    Stroke Other    Kidney cancer Neg Hx    Bladder Cancer Neg Hx    Prolactinoma Neg Hx    Social History   Socioeconomic History   Marital status: Widowed    Spouse name: Not on file   Number of children: Not on file   Years of education: Not on file  Highest education level: Not on file  Occupational History   Not on file  Tobacco Use   Smoking status: Former    Packs/day: 1.00    Years: 25.00    Pack years: 25.00    Types: Cigarettes    Quit date: 02/08/1994    Years since quitting: 26.9   Smokeless tobacco: Never   Tobacco comments:    smoked total 30 years quit 20 years ago from 2019 max 1 ppd no FH lung cancer   Vaping Use   Vaping Use: Never used  Substance and Sexual Activity   Alcohol use: No   Drug use: Never   Sexual activity: Not on file  Other Topics Concern   Not on file  Social History Narrative   2 years college    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   Social Determinants of Radio broadcast assistant Strain: Low Risk    Difficulty of Paying Living Expenses: Not hard at all  Food Insecurity: No Food Insecurity   Worried About Charity fundraiser in the Last Year: Never true   Arboriculturist in the Last Year: Never true  Transportation Needs: No Transportation Needs   Lack of Transportation (Medical): No   Lack of  Transportation (Non-Medical): No  Physical Activity: Sufficiently Active   Days of Exercise per Week: 5 days   Minutes of Exercise per Session: 30 min  Stress: No Stress Concern Present   Feeling of Stress : Not at all  Social Connections: Unknown   Frequency of Communication with Friends and Family: More than three times a week   Frequency of Social Gatherings with Friends and Family: More than three times a week   Attends Religious Services: More than 4 times per year   Active Member of [employer] or Organizations: Yes   Attends Music therapist: More than 4 times per year   Marital Status: Not on file  Intimate Partner Violence: Not At Risk   Fear of Current or Ex-Partner: No   Emotionally Abused: No   Physically Abused: No   Sexually Abused: No   Current Meds  Medication Sig   acetaminophen (TYLENOL) 500 MG tablet Take 1,000 mg by mouth 2 (two) times daily.    Cholecalciferol (VITAMIN D3) 5000 units CAPS Take 5,000 Units by mouth daily.    Cyanocobalamin (B-12) 5000 MCG CAPS Take 5,000 mcg by mouth daily.    Magnesium 500 MG CAPS Take 500 mg by mouth at bedtime.    [DISCONTINUED] allopurinol (ZYLOPRIM) 300 MG tablet Take 1 tablet (300 mg total) by mouth daily.   [DISCONTINUED] amLODipine (NORVASC) 2.5 MG tablet Take 1-2 tablets (2.5-5 mg total) by mouth daily. Start with 2.5 mg dose if BP >130/>80 take 2 pills =5 mg   [DISCONTINUED] atorvastatin (LIPITOR) 40 MG tablet Take 1 tablet (40 mg total) by mouth daily at 6 PM.   [DISCONTINUED] ezetimibe (ZETIA) 10 MG tablet Take 1 tablet (10 mg total) by mouth at bedtime.   [DISCONTINUED] gabapentin (NEURONTIN) 300 MG capsule Take 1 capsule (300 mg total) by mouth at bedtime.   [DISCONTINUED] ketoconazole (NIZORAL) 2 % cream Apply 1 application topically 2 (two) times daily as needed for irritation. FACE   [DISCONTINUED] methocarbamol (ROBAXIN) 750 MG tablet TAKE ONE TABLET BY MOUTH EVERY NIGHT AT BEDTIME AS NEEDED MUSCLE SPASMS    [DISCONTINUED] pantoprazole (PROTONIX) 40 MG tablet Take 1 tablet (40 mg total) by mouth at bedtime.   [  DISCONTINUED] potassium chloride (MICRO-K) 10 MEQ CR capsule Take 2 capsules (20 mEq total) by mouth every morning.   [DISCONTINUED] traMADol (ULTRAM) 50 MG tablet TAKE TWO TABLETS BY MOUTH THREE TIMES A DAY AS NEEDED   [DISCONTINUED] triamterene-hydrochlorothiazide (MAXZIDE-25) 37.5-25 MG tablet Take 1 tablet by mouth every morning.   Allergies  Allergen Reactions   Nortriptyline Nausea Only   Sulfa Antibiotics Rash   No results found for this or any previous visit (from the past 2160 hour(s)). Objective  Body mass index is 36.31 kg/m. Wt Readings from Last 3 Encounters:  01/31/21 218 lb 3.2 oz (99 kg)  01/09/21 224 lb (101.6 kg)  07/20/20 224 lb (101.6 kg)   Temp Readings from Last 3 Encounters:  01/31/21 (!) 97.3 F (36.3 C) (Temporal)  07/20/20 98.1 F (36.7 C) (Oral)  02/18/20 (!) 97.1 F (36.2 C) (Temporal)   BP Readings from Last 3 Encounters:  01/31/21 138/88  07/20/20 112/80  02/18/20 (!) 160/67   Pulse Readings from Last 3 Encounters:  01/31/21 77  07/20/20 78  02/18/20 73    Physical Exam Vitals and nursing note reviewed.  Constitutional:      Appearance: Normal appearance. She is well-developed and well-groomed.  HENT:     Head: Normocephalic and atraumatic.  Eyes:     Conjunctiva/sclera: Conjunctivae normal.     Pupils: Pupils are equal, round, and reactive to light.  Cardiovascular:     Rate and Rhythm: Normal rate and regular rhythm.     Heart sounds: Normal heart sounds. No murmur heard. Pulmonary:     Effort: Pulmonary effort is normal.     Breath sounds: Normal breath sounds.  Abdominal:     General: Abdomen is flat. Bowel sounds are normal.     Tenderness: There is no abdominal tenderness.  Musculoskeletal:        General: No tenderness.  Skin:    General: Skin is warm and dry.  Neurological:     General: No focal deficit present.      Mental Status: She is alert and oriented to person, place, and time. Mental status is at baseline.     Cranial Nerves: Cranial nerves 2-12 are intact.     Gait: Gait is intact.  Psychiatric:        Attention and Perception: Attention and perception normal.        Mood and Affect: Mood and affect normal.        Speech: Speech normal.        Behavior: Behavior normal. Behavior is cooperative.        Thought Content: Thought content normal.        Cognition and Memory: Cognition and memory normal.        Judgment: Judgment normal.    Assessment  Plan  Screening mammogram, encounter for - Plan: MM 3D SCREEN BREAST BILATERAL  Screening for colon cancer - Plan: Ambulatory referral to Gastroenterology  Positive colorectal cancer screening using Cologuard test - Plan: Ambulatory referral to Gastroenterology  Primary hypertension - Plan: Comprehensive metabolic panel, Lipid panel, CBC with Differential/Platelet  Recurrent UTI - Plan: Urine Culture  Gout, unspecified cause, unspecified chronicity, unspecified site - Plan: Uric acid, allopurinol (ZYLOPRIM) 300 MG tablet  Hyperlipidemia, unspecified hyperlipidemia type - Plan: Lipid panel, atorvastatin (LIPITOR) 40 MG tablet  Prediabetes - Plan: Hemoglobin A1c  Essential hypertension - Plan: amLODipine (NORVASC) 2.5 MG tablet, triamterene-hydrochlorothiazide (MAXZIDE-25) 37.5-25 MG tablet  Lumbar radiculopathy - Plan: gabapentin (NEURONTIN) 300 MG capsule, methocarbamol (  ROBAXIN) 750 MG tablet, traMADol (ULTRAM) 50 MG tablet  Lumbar herniated disc - Plan: gabapentin (NEURONTIN) 300 MG capsule, methocarbamol (ROBAXIN) 750 MG tablet, traMADol (ULTRAM) 50 MG tablet  Abnormal MRI, lumbar spine - Plan: gabapentin (NEURONTIN) 300 MG capsule, traMADol (ULTRAM) 50 MG tablet  Bilateral low back pain with sciatica, sciatica laterality unspecified, unspecified chronicity - Plan: gabapentin (NEURONTIN) 300 MG capsule  Gastroesophageal reflux disease  - Plan: pantoprazole (PROTONIX) 40 MG tablet  Hypokalemia - Plan: potassium chloride (MICRO-K) 10 MEQ CR capsule  Pain of both hip joints - Plan: traMADol (ULTRAM) 50 MG tablet   Flu shot had utd Tdap 05/11/13  covid 2/2 consider pfizer had covid 10/2019 after 2 covid vaccines with reduced appetite did not have MAB and skin dermatitis started face and scalp   prevnar had 05/11/13, pna 12 had 04/09/16  Had zostavax 2014  1/2 shingrix 08/30/19 2nd dose due 02/29/20   Hep B 2/3 had still due for 3rd vaccine has 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 Was due 10/17/17, also had hep A 03/19/17 2nd dose due in 6-12 months h/o fatty liver   -as of 01/31/21 has not had    MMR immune   Colonoscopy overdue disc reconsider another in future  Declines wants cologuard 09/29/19 +  -consider colonoscopy as of 02/17/20, 07/20/20 she will let me know where wants to go   ? Date of last pap per pt no h/o abnormal pt per chart h/o abnormal pap Dr. Vickki Muff has record. Last I can see 11/21/01 neg pap +BV    Mammogram 11/13/17 2.5 cm cyst right breast f/u in 1 year will need to reorder in future  pt to call  Ordered 2.5 right breast cyst 07/25/20 negative   DEXA 04/03/12 osteopenia rec calcium 600 mg bid and on vit D3 5000 qd. Vit D normal on 5000 IU D3 qd  -consider repeat DEXA ordered sch 07/25/20 mammo and dexa    Former smoker total 30 years max 1 ppd no FH lung cancer quit 20 years ago from 2019 CXR ordered 2/2 wheezing    Hep C neg 04/09/16    H/o skin cancer Dr. Kellie Moor not seen for 2021 f/u in 2021 ln2 face, arms, hands  appt upcoming 07/2020 or 08/2020 Ln2'ed  sch 02/07/21    rec healthy diet and exercise    Urology Dr. Boneta Lucks est appt 10/2020 needs repeat US right kidney cysts   Provider: Dr. Olivia Mackie McLean-Scocuzza-Internal Medicine

## 2021-02-03 ENCOUNTER — Other Ambulatory Visit: Payer: Self-pay | Admitting: Internal Medicine

## 2021-02-03 DIAGNOSIS — N3000 Acute cystitis without hematuria: Secondary | ICD-10-CM

## 2021-02-03 LAB — URINE CULTURE
MICRO NUMBER:: 12690029
SPECIMEN QUALITY:: ADEQUATE

## 2021-02-03 MED ORDER — CIPROFLOXACIN HCL 500 MG PO TABS: 500.0000 mg | ORAL_TABLET | Freq: Two times a day (BID) | ORAL | 0 refills | Status: DC

## 2021-02-17 ENCOUNTER — Other Ambulatory Visit: Payer: Self-pay

## 2021-02-17 ENCOUNTER — Other Ambulatory Visit: Payer: Medicare HMO

## 2021-02-17 DIAGNOSIS — N390 Urinary tract infection, site not specified: Secondary | ICD-10-CM

## 2021-02-18 LAB — URINALYSIS, ROUTINE W REFLEX MICROSCOPIC
Bilirubin Urine: NEGATIVE
Glucose, UA: NEGATIVE
Hgb urine dipstick: NEGATIVE
Hyaline Cast: NONE SEEN /LPF
Ketones, ur: NEGATIVE
Nitrite: NEGATIVE
Specific Gravity, Urine: 1.025 (ref 1.001–1.035)
pH: 5.5 (ref 5.0–8.0)

## 2021-02-18 LAB — MICROSCOPIC MESSAGE

## 2021-02-18 LAB — URINE CULTURE
MICRO NUMBER:: 12767699
SPECIMEN QUALITY:: ADEQUATE

## 2021-04-05 DIAGNOSIS — R195 Other fecal abnormalities: Secondary | ICD-10-CM | POA: Diagnosis not present

## 2021-04-05 DIAGNOSIS — Z8711 Personal history of peptic ulcer disease: Secondary | ICD-10-CM | POA: Diagnosis not present

## 2021-04-17 DIAGNOSIS — D490 Neoplasm of unspecified behavior of digestive system: Secondary | ICD-10-CM | POA: Diagnosis not present

## 2021-04-17 DIAGNOSIS — K5730 Diverticulosis of large intestine without perforation or abscess without bleeding: Secondary | ICD-10-CM | POA: Diagnosis not present

## 2021-04-17 DIAGNOSIS — K6289 Other specified diseases of anus and rectum: Secondary | ICD-10-CM | POA: Diagnosis not present

## 2021-04-17 DIAGNOSIS — K640 First degree hemorrhoids: Secondary | ICD-10-CM | POA: Diagnosis not present

## 2021-04-17 DIAGNOSIS — Z1211 Encounter for screening for malignant neoplasm of colon: Secondary | ICD-10-CM | POA: Diagnosis not present

## 2021-04-17 DIAGNOSIS — D175 Benign lipomatous neoplasm of intra-abdominal organs: Secondary | ICD-10-CM | POA: Diagnosis not present

## 2021-04-17 DIAGNOSIS — R195 Other fecal abnormalities: Secondary | ICD-10-CM | POA: Diagnosis not present

## 2021-04-17 DIAGNOSIS — C19 Malignant neoplasm of rectosigmoid junction: Secondary | ICD-10-CM | POA: Diagnosis not present

## 2021-04-17 DIAGNOSIS — C20 Malignant neoplasm of rectum: Secondary | ICD-10-CM | POA: Diagnosis not present

## 2021-04-17 HISTORY — PX: OTHER SURGICAL HISTORY: SHX169

## 2021-04-21 DIAGNOSIS — E785 Hyperlipidemia, unspecified: Secondary | ICD-10-CM | POA: Diagnosis not present

## 2021-04-21 DIAGNOSIS — I10 Essential (primary) hypertension: Secondary | ICD-10-CM | POA: Diagnosis not present

## 2021-04-21 DIAGNOSIS — D6851 Activated protein C resistance: Secondary | ICD-10-CM | POA: Diagnosis not present

## 2021-04-21 DIAGNOSIS — K219 Gastro-esophageal reflux disease without esophagitis: Secondary | ICD-10-CM | POA: Diagnosis not present

## 2021-04-21 DIAGNOSIS — I700 Atherosclerosis of aorta: Secondary | ICD-10-CM | POA: Diagnosis not present

## 2021-04-21 DIAGNOSIS — M461 Sacroiliitis, not elsewhere classified: Secondary | ICD-10-CM | POA: Diagnosis not present

## 2021-04-21 DIAGNOSIS — C189 Malignant neoplasm of colon, unspecified: Secondary | ICD-10-CM | POA: Diagnosis not present

## 2021-04-21 DIAGNOSIS — Z008 Encounter for other general examination: Secondary | ICD-10-CM | POA: Diagnosis not present

## 2021-04-21 DIAGNOSIS — G63 Polyneuropathy in diseases classified elsewhere: Secondary | ICD-10-CM | POA: Diagnosis not present

## 2021-04-21 DIAGNOSIS — K2270 Barrett's esophagus without dysplasia: Secondary | ICD-10-CM | POA: Diagnosis not present

## 2021-04-21 DIAGNOSIS — K5900 Constipation, unspecified: Secondary | ICD-10-CM | POA: Diagnosis not present

## 2021-04-21 DIAGNOSIS — G8929 Other chronic pain: Secondary | ICD-10-CM | POA: Diagnosis not present

## 2021-04-25 ENCOUNTER — Telehealth: Payer: Self-pay | Admitting: Internal Medicine

## 2021-04-25 ENCOUNTER — Other Ambulatory Visit: Payer: Self-pay | Admitting: Gastroenterology

## 2021-04-25 ENCOUNTER — Encounter: Payer: Self-pay | Admitting: Oncology

## 2021-04-25 ENCOUNTER — Inpatient Hospital Stay: Payer: Medicare HMO | Attending: Oncology | Admitting: Oncology

## 2021-04-25 ENCOUNTER — Encounter: Payer: Self-pay | Admitting: Internal Medicine

## 2021-04-25 ENCOUNTER — Inpatient Hospital Stay: Payer: Medicare HMO

## 2021-04-25 ENCOUNTER — Other Ambulatory Visit: Payer: Self-pay

## 2021-04-25 VITALS — BP 154/69 | HR 86 | Temp 97.5°F | Resp 18 | Wt 218.5 lb

## 2021-04-25 DIAGNOSIS — C20 Malignant neoplasm of rectum: Secondary | ICD-10-CM

## 2021-04-25 DIAGNOSIS — Z809 Family history of malignant neoplasm, unspecified: Secondary | ICD-10-CM

## 2021-04-25 DIAGNOSIS — Z79899 Other long term (current) drug therapy: Secondary | ICD-10-CM | POA: Diagnosis not present

## 2021-04-25 DIAGNOSIS — Z87891 Personal history of nicotine dependence: Secondary | ICD-10-CM | POA: Diagnosis not present

## 2021-04-25 DIAGNOSIS — Z8052 Family history of malignant neoplasm of bladder: Secondary | ICD-10-CM | POA: Insufficient documentation

## 2021-04-25 DIAGNOSIS — Z803 Family history of malignant neoplasm of breast: Secondary | ICD-10-CM | POA: Insufficient documentation

## 2021-04-25 DIAGNOSIS — Z7189 Other specified counseling: Secondary | ICD-10-CM | POA: Insufficient documentation

## 2021-04-25 LAB — COMPREHENSIVE METABOLIC PANEL WITH GFR
ALT: 24 U/L (ref 0–44)
AST: 22 U/L (ref 15–41)
Albumin: 4.4 g/dL (ref 3.5–5.0)
Alkaline Phosphatase: 95 U/L (ref 38–126)
Anion gap: 10 (ref 5–15)
BUN: 18 mg/dL (ref 8–23)
CO2: 25 mmol/L (ref 22–32)
Calcium: 9.5 mg/dL (ref 8.9–10.3)
Chloride: 100 mmol/L (ref 98–111)
Creatinine, Ser: 1.05 mg/dL — ABNORMAL HIGH (ref 0.44–1.00)
GFR, Estimated: 56 mL/min — ABNORMAL LOW
Glucose, Bld: 126 mg/dL — ABNORMAL HIGH (ref 70–99)
Potassium: 3.4 mmol/L — ABNORMAL LOW (ref 3.5–5.1)
Sodium: 135 mmol/L (ref 135–145)
Total Bilirubin: 0.8 mg/dL (ref 0.3–1.2)
Total Protein: 7.7 g/dL (ref 6.5–8.1)

## 2021-04-25 LAB — CBC WITH DIFFERENTIAL/PLATELET
Abs Immature Granulocytes: 0.01 10*3/uL (ref 0.00–0.07)
Basophils Absolute: 0 10*3/uL (ref 0.0–0.1)
Basophils Relative: 0 %
Eosinophils Absolute: 0.2 10*3/uL (ref 0.0–0.5)
Eosinophils Relative: 2 %
HCT: 41.5 % (ref 36.0–46.0)
Hemoglobin: 13.7 g/dL (ref 12.0–15.0)
Immature Granulocytes: 0 %
Lymphocytes Relative: 24 %
Lymphs Abs: 2.5 10*3/uL (ref 0.7–4.0)
MCH: 31 pg (ref 26.0–34.0)
MCHC: 33 g/dL (ref 30.0–36.0)
MCV: 93.9 fL (ref 80.0–100.0)
Monocytes Absolute: 0.5 10*3/uL (ref 0.1–1.0)
Monocytes Relative: 5 %
Neutro Abs: 7.1 10*3/uL (ref 1.7–7.7)
Neutrophils Relative %: 69 %
Platelets: 254 10*3/uL (ref 150–400)
RBC: 4.42 MIL/uL (ref 3.87–5.11)
RDW: 15.6 % — ABNORMAL HIGH (ref 11.5–15.5)
WBC: 10.5 10*3/uL (ref 4.0–10.5)
nRBC: 0 % (ref 0.0–0.2)

## 2021-04-25 NOTE — Progress Notes (Signed)
Call placed to [facility] and spoke with M. Croley's nurse. She will call and get her imaging  scheduled. Will follow up to ensure this is done.

## 2021-04-25 NOTE — Telephone Encounter (Signed)
I need record of colonoscopy please    Colonoscopy performed 2/13 by Dr. Alice Reichert at [facility] for indications of positive Cologuard showed infiltrative, sessile, and ulcerated non-obstructing small mass in distal rectum measuring 2 cm in length and diameter 2 mm 0-3 cm from anal verge. 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 I called patient with results and recommendations.

## 2021-04-25 NOTE — Progress Notes (Signed)
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 Patient had a positive Cologuard and was referred to see gastroenterology.  #Her colonoscopy showed rectal mass 0-3 cm from the anal verge.  Biopsied.  Small lipoma in the distal sigmoid colon, biopsied.  Mild diverticulosis in the sigmoid colon.  Nonbleeding internal hemorrhoids.  04/19/21 Rectal mass biopsy showed moderately differentiated adenocarcinoma, anorectal junctional mucosa with adenomatous high-grade dysplasia.  Distal sigmoid colon biopsy showed colonic mucosa with no significant histopathological changes.  Negative for microscopic colitis.    Patient was referred to establish care with oncology. Patient denies any change of bowel habits, blood in the stool, abdominal pain. She is widowed.  She has no children.  She was accompanied by her nephew's wife.   Review of Systems  Constitutional:  Negative for appetite change, chills, fatigue and fever.  HENT:   Negative for hearing loss and voice change.   Eyes:  Negative for eye problems.  Respiratory:  Negative for chest tightness and cough.   Cardiovascular:  Negative for chest pain.  Gastrointestinal:  Negative for abdominal distention, abdominal pain and blood in stool.  Endocrine: Negative for hot flashes.  Genitourinary:  Negative for difficulty urinating and frequency.   Musculoskeletal:  Negative for arthralgias.  Skin:  Negative for itching and rash.  Neurological:  Negative for extremity weakness.  Hematological:  Negative  for adenopathy.  Psychiatric/Behavioral:  Negative for confusion.    MEDICAL HISTORY:  Past Medical History:  Diagnosis Date   Arthritis    Basal cell carcinoma    right temple Dr. Elmer Ramp removed 01/2017    Chicken pox    COVID-19    10/13/19   Emphysema of lung ([place])    h/o bronchitis not emphysema   Fatty liver    09/19/15   Gastric ulcer    neg H pylori    GERD (gastroesophageal reflux disease)    Gout    Hiatal hernia    SMALL   History of kidney stones    Hyperlipidemia    Hypertension    Kidney stone    noted CT ab/pelvis 09/19/15 left kidney    Kidney stone    Dr. Rogers Blocker 2019    Lumbar herniated disc    Recurrent UTI    Syncope    per pt presyncope no LOC felt dizzy/vision distorted and couldnt speak EEG neg, CT head neg 07/31/16, f/u [facility] cardiology with US carotid, echo but no Holter    Umbilical hernia    noted 09/19/15    UTI (urinary tract infection)     SURGICAL HISTORY: Past Surgical History:  Procedure Laterality Date   BREAST BIOPSY Right 1980   cyst removed negative pathology    CYSTOSCOPY W/ RETROGRADES Left 02/18/2020   Procedure: CYSTOSCOPY WITH RETROGRADE PYELOGRAM;  Surgeon: Royston Cowper, MD;  Location: [facility] ORS;  Service: Urology;  Laterality: Left;   EXTRACORPOREAL SHOCK WAVE LITHOTRIPSY Left 09/19/2017   Procedure: EXTRACORPOREAL SHOCK WAVE LITHOTRIPSY (ESWL);  Surgeon: Royston Cowper, MD;  Location: [facility] ORS;  Service: Urology;  Laterality: Left;   JOINT REPLACEMENT     right knee 11/2015 Dr. Janell Quiet [facility] [place]    SOCIAL HISTORY: Social History   Socioeconomic History   Marital status: Widowed    Spouse name: Not on file   Number of children: Not on file   Years of education: Not on file   Highest education level: Not on file  Occupational History   Not on file  Tobacco Use   Smoking status: Former    Packs/day: 1.00    Years: 25.00    Pack years: 25.00    Types: Cigarettes    Quit date: 02/08/1994    Years since  quitting: 27.2   Smokeless tobacco: Never   Tobacco comments:    smoked total 30 years quit 20 years ago from 2019 max 1 ppd no FH lung cancer   Vaping Use   Vaping Use: Never used  Substance and Sexual Activity   Alcohol use: No   Drug use: Never   Sexual activity: Not on file  Other Topics Concern   Not on file  Social History Narrative   2 years college    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   Social Determinants of Radio broadcast assistant Strain: Low Risk    Difficulty of Paying Living Expenses: Not hard at all  Food Insecurity: No Food Insecurity   Worried About Charity fundraiser in the Last Year: Never true   Arboriculturist in the Last Year: Never true  Transportation Needs: No Transportation Needs   Lack of Transportation (Medical): No   Lack of Transportation (Non-Medical): No  Physical Activity: Sufficiently Active   Days of Exercise per Week: 5 days   Minutes of Exercise per Session: 30 min  Stress: No Stress Concern Present   Feeling of Stress : Not at all  Social Connections: Unknown   Frequency of Communication with Friends and Family: More than three times a week   Frequency of Social Gatherings with Friends and Family: More than three times a week   Attends Religious Services: More than 4 times per year   Active Member of [employer] or Organizations: Yes   Attends Music therapist: More than 4 times per year   Marital Status: Not on file  Intimate Partner Violence: Not At Risk   Fear of Current or Ex-Partner: No   Emotionally Abused: No   Physically Abused: No   Sexually Abused: No    FAMILY HISTORY: Family History  Problem Relation Age of Onset   Stroke Mother    Breast cancer Mother 40   Cancer Mother        breast cancer s/p masectomy lived to be 45    Hypertension Mother    Cancer Father        NMSC skin cancer    Gout Father    Heart disease Father        s/p bypass surgery age 3 lived 68 months after surgery     Kidney disease Father        RAS>kidney failure    Bladder Cancer Brother    Hyperlipidemia Other    Stroke Other    Kidney cancer Neg Hx    Prolactinoma Neg Hx     ALLERGIES:  is allergic to nortriptyline and sulfa antibiotics.  MEDICATIONS:  Current Outpatient Medications  Medication Sig Dispense Refill   acetaminophen (TYLENOL) 500 MG tablet Take 1,000 mg by  mouth 2 (two) times daily.      allopurinol (ZYLOPRIM) 300 MG tablet Take 1 tablet (300 mg total) by mouth daily. 90 tablet 3   amLODipine (NORVASC) 2.5 MG tablet Take 1-2 tablets (2.5-5 mg total) by mouth daily. Start with 2.5 mg dose if BP >130/>80 take 2 pills =5 mg 180 tablet 3   atorvastatin (LIPITOR) 40 MG tablet Take 1 tablet (40 mg total) by mouth daily at 6 PM. 90 tablet 3   Cholecalciferol (VITAMIN D3) 5000 units CAPS Take 5,000 Units by mouth daily.      Cyanocobalamin (B-12) 5000 MCG CAPS Take 5,000 mcg by mouth daily.      ezetimibe (ZETIA) 10 MG tablet Take 1 tablet (10 mg total) by mouth at bedtime. 90 tablet 3   gabapentin (NEURONTIN) 300 MG capsule Take 1 capsule (300 mg total) by mouth at bedtime. 90 capsule 3   ketoconazole (NIZORAL) 2 % cream Apply 1 application topically 2 (two) times daily as needed for irritation. FACE 30 g 11   Magnesium 500 MG CAPS Take 500 mg by mouth at bedtime.      methocarbamol (ROBAXIN) 750 MG tablet TAKE ONE TABLET BY MOUTH EVERY NIGHT AT BEDTIME AS NEEDED MUSCLE SPASMS 30 tablet 11   pantoprazole (PROTONIX) 40 MG tablet Take 1 tablet (40 mg total) by mouth at bedtime. 90 tablet 3   potassium chloride (MICRO-K) 10 MEQ CR capsule Take 2 capsules (20 mEq total) by mouth every morning. 180 capsule 3   traMADol (ULTRAM) 50 MG tablet TAKE TWO TABLETS BY MOUTH THREE TIMES A DAY AS NEEDED 170 tablet 5   triamterene-hydrochlorothiazide (MAXZIDE-25) 37.5-25 MG tablet Take 1 tablet by mouth every morning. 90 tablet 3   ciprofloxacin (CIPRO) 500 MG tablet Take 1 tablet (500 mg total) by mouth  2 (two) times daily. Ith food (Patient not taking: Reported on 04/25/2021) 10 tablet 0   No current facility-administered medications for this visit.     PHYSICAL EXAMINATION: ECOG PERFORMANCE STATUS: 0 - Asymptomatic Vitals:   04/25/21 1506  BP: (!) 154/69  Pulse: 86  Resp: 18  Temp: (!) 97.5 F (36.4 C)   Filed Weights   04/25/21 1506  Weight: 218 lb 8 oz (99.1 kg)    Physical Exam Constitutional:      General: She is not in acute distress. HENT:     Head: Normocephalic and atraumatic.  Eyes:     General: No scleral icterus. Cardiovascular:     Rate and Rhythm: Normal rate and regular rhythm.     Heart sounds: Normal heart sounds.  Pulmonary:     Effort: Pulmonary effort is normal. No respiratory distress.     Breath sounds: No wheezing.  Abdominal:     General: Bowel sounds are normal. There is no distension.     Palpations: Abdomen is soft.  Musculoskeletal:        General: No deformity. Normal range of motion.     Cervical back: Normal range of motion and neck supple.  Skin:    General: Skin is warm and dry.     Findings: No erythema or rash.  Neurological:     Mental Status: She is alert and oriented to person, place, and time. Mental status is at baseline.     Cranial Nerves: No cranial nerve deficit.     Coordination: Coordination normal.  Psychiatric:        Mood and Affect: Mood normal.    LABORATORY DATA:  I  have reviewed the data as listed Lab Results  Component Value Date   WBC 10.5 04/25/2021   HGB 13.7 04/25/2021   HCT 41.5 04/25/2021   MCV 93.9 04/25/2021   PLT 254 04/25/2021   Recent Labs    04/27/20 0814 01/31/21 0910 04/25/21 1552  NA 141 139 135  K 3.9 3.8 3.4*  CL 100 100 100  CO2 31 28 25   GLUCOSE 104* 103* 126*  BUN 23 22 18   CREATININE 0.98 0.95 1.05*  CALCIUM 9.8 10.0 9.5  GFRNONAA  --   --  56*  PROT 7.0 6.8 7.7  ALBUMIN 4.2 4.4 4.4  AST 14 14 22   ALT 18 21 24   ALKPHOS 94 91 95  BILITOT 0.8 0.7 0.8    Iron/TIBC/Ferritin/ %Sat No results found for: IRON, TIBC, FERRITIN, IRONPCTSAT    RADIOGRAPHIC STUDIES: I have personally reviewed the radiological images as listed and agreed with the findings in the report. No results found.    ASSESSMENT & PLAN:  1. Rectal cancer (Cynthia Bryan)   2. Goals of care, counseling/discussion   3. Family history of cancer     #Pathology reports were reviewed and discussed with patient.  Diagnosis was discussed. Check CBC, CMP, CEA. Check CT chest/abdomen with contrast, MRI pelvis rectal protocol for further evaluation and staging. Discussed with patient that depending on the depth of invasion of the tumor and lymph node involvement, she may only need surgical resection or may need neoadjuvant treatment prior to surgery.  Family history of breast cancer, bladder cancer, rectal cancer, discussed about genetic testing.  Patient will consider and update me about her decision. Orders Placed This Encounter  Procedures   CBC with Differential/Platelet    Standing Status:   Future    Number of Occurrences:   1    Standing Expiration Date:   04/25/2022   Comprehensive metabolic panel    Standing Status:   Future    Number of Occurrences:   1    Standing Expiration Date:   04/25/2022   CEA    Standing Status:   Future    Number of Occurrences:   1    Standing Expiration Date:   04/25/2022    All questions were answered. The patient knows to call the clinic with any problems questions or concerns.   Geanie Kenning, [place]*    Return of visit: TBD Thank you for this kind referral and the opportunity to participate in the care of this patient. A copy of today's note is routed to referring provider   Earlie Server, MD, PhD [facility] Hematology Oncology 04/25/2021

## 2021-04-25 NOTE — Progress Notes (Signed)
Patient here to establish care for rectal cancer.

## 2021-04-26 ENCOUNTER — Other Ambulatory Visit: Payer: Self-pay | Admitting: Gastroenterology

## 2021-04-26 ENCOUNTER — Encounter: Payer: Self-pay | Admitting: Internal Medicine

## 2021-04-26 DIAGNOSIS — C20 Malignant neoplasm of rectum: Secondary | ICD-10-CM

## 2021-04-26 LAB — CEA: CEA: 1.8 ng/mL (ref 0.0–4.7)

## 2021-04-27 ENCOUNTER — Ambulatory Visit: Admission: RE | Admit: 2021-04-27 | Payer: Medicare HMO | Source: Ambulatory Visit

## 2021-04-28 ENCOUNTER — Other Ambulatory Visit: Payer: Self-pay

## 2021-04-28 ENCOUNTER — Ambulatory Visit
Admission: RE | Admit: 2021-04-28 | Discharge: 2021-04-28 | Disposition: A | Discharge status: Home / Self Care | Payer: Medicare HMO | Source: Ambulatory Visit | Attending: Gastroenterology | Admitting: Gastroenterology

## 2021-04-28 DIAGNOSIS — C20 Malignant neoplasm of rectum: Secondary | ICD-10-CM | POA: Insufficient documentation

## 2021-04-28 DIAGNOSIS — C189 Malignant neoplasm of colon, unspecified: Secondary | ICD-10-CM | POA: Diagnosis not present

## 2021-05-02 NOTE — Telephone Encounter (Signed)
error 

## 2021-05-09 ENCOUNTER — Ambulatory Visit
Admission: RE | Admit: 2021-05-09 | Discharge: 2021-05-09 | Disposition: A | Discharge status: Home / Self Care | Payer: Medicare HMO | Source: Ambulatory Visit | Attending: Gastroenterology | Admitting: Gastroenterology

## 2021-05-09 DIAGNOSIS — I700 Atherosclerosis of aorta: Secondary | ICD-10-CM | POA: Diagnosis not present

## 2021-05-09 DIAGNOSIS — N281 Cyst of kidney, acquired: Secondary | ICD-10-CM | POA: Diagnosis not present

## 2021-05-09 DIAGNOSIS — C20 Malignant neoplasm of rectum: Secondary | ICD-10-CM | POA: Insufficient documentation

## 2021-05-09 DIAGNOSIS — K449 Diaphragmatic hernia without obstruction or gangrene: Secondary | ICD-10-CM | POA: Diagnosis not present

## 2021-05-09 DIAGNOSIS — N200 Calculus of kidney: Secondary | ICD-10-CM | POA: Diagnosis not present

## 2021-05-09 DIAGNOSIS — I3139 Other pericardial effusion (noninflammatory): Secondary | ICD-10-CM | POA: Diagnosis not present

## 2021-05-09 DIAGNOSIS — R911 Solitary pulmonary nodule: Secondary | ICD-10-CM | POA: Diagnosis not present

## 2021-05-09 MED ORDER — IOHEXOL 300 MG/ML  SOLN
100.0000 mL | Freq: Once | INTRAMUSCULAR | Status: AC | PRN
  Administered 2021-05-09: 100 mL via INTRAVENOUS

## 2021-05-10 ENCOUNTER — Telehealth: Payer: Self-pay

## 2021-05-10 NOTE — Telephone Encounter (Signed)
Call placed to Ms. Nimmons regarding surgical consult that was made to [facility], Dr. Dema Severin. She has not been contacted with an appointment. Call placed to [facility]. Transferred to referral line where a voicemail was left. ?

## 2021-05-10 NOTE — Telephone Encounter (Signed)
Original referral from [facility] refaxed to [facility] with confirmation of receipt. ?

## 2021-05-12 DIAGNOSIS — C20 Malignant neoplasm of rectum: Secondary | ICD-10-CM | POA: Diagnosis not present

## 2021-05-15 ENCOUNTER — Telehealth: Payer: Self-pay

## 2021-05-15 NOTE — Telephone Encounter (Signed)
Transanal excision has been scheduled for 05/26/21. Will arrange follow up with Dr Tasia Catchings 2 weeks after surgery. Message sent to scheduling. ?

## 2021-05-17 ENCOUNTER — Other Ambulatory Visit: Payer: Self-pay

## 2021-05-17 NOTE — Progress Notes (Signed)
The proposed treatment discussed in conference is for discussion purpose only and is not a binding recommendation.  The patients have not been physically examined, or presented with their treatment options.  Therefore, final treatment plans cannot be decided.  

## 2021-05-23 ENCOUNTER — Other Ambulatory Visit: Payer: Self-pay

## 2021-05-23 ENCOUNTER — Encounter (HOSPITAL_BASED_OUTPATIENT_CLINIC_OR_DEPARTMENT_OTHER): Payer: Self-pay | Admitting: Surgery

## 2021-05-23 NOTE — Progress Notes (Signed)
Spoke w/ via phone for pre-op interview---pt ?Lab needs dos----  istat, ekg    per anesthesia surgery orders req dr white epic ib         ?Lab results------echo 08-14-2016 [facility], myocardial perfusion 11-21-2015 [facility] ?COVID test -----patient states asymptomatic no test needed ?Arrive at -------1230 pm 05-26-2021 ?NPO after MN NO Solid Food.  Clear liquids from MN until---1130 am ?Med rec completed ?Medications to take morning of surgery -----amlodipine, allopurinol, tramadol ?Diabetic medication -----n/a ?Patient instructed no nail polish to be worn day of surgery ?Patient instructed to bring photo id and insurance card day of surgery ?Patient aware to have Driver (ride ) / caregiver  nephew chris troutman or christen hunter nephew wife   for 24 hours after surgery  ?Patient Special Instructions -----follow all bowel prep instructions from dr white ?Pre-Op special Istructions -----none ?Patient verbalized understanding of instructions that were given at this phone interview. ?Patient denies shortness of breath, chest pain, fever, cough at this phone interview.  ?

## 2021-05-24 ENCOUNTER — Ambulatory Visit: Payer: Self-pay | Admitting: Surgery

## 2021-05-25 NOTE — Anesthesia Preprocedure Evaluation (Addendum)
Anesthesia Evaluation  ?Patient identified by MRN, date of birth, ID band ?Patient awake ? ? ? ?Reviewed: ?Allergy & Precautions, NPO status , Patient's Chart, lab work & pertinent test results ? ?Airway ?Mallampati: III ? ?TM Distance: >3 FB ?Neck ROM: Full ? ? ? Dental ?no notable dental hx. ?(+) Teeth Intact, Dental Advisory Given ?  ?Pulmonary ?former smoker,  ?  ?Pulmonary exam normal ?breath sounds clear to auscultation ? ? ? ? ? ? Cardiovascular ?hypertension, Pt. on medications ?Normal cardiovascular exam ?Rhythm:Regular Rate:Normal ? ? ?  ?Neuro/Psych ?negative psych ROS  ? GI/Hepatic ?Neg liver ROS, hiatal hernia, GERD  ,Anorectal CA ?  ?Endo/Other  ?negative endocrine ROS ? Renal/GU ?  ? ?  ?Musculoskeletal ? ?(+) Arthritis , Chronic back pain  ? Abdominal ?(+) + obese (BMI 35.2),   ?Peds ? Hematology ?Lab Results ?     Component                Value               Date                ?     HGB                      13.7                04/25/2021                ?     PLT                      254                 04/25/2021           ?   ?Anesthesia Other Findings ?All: sulfa, elavil ? Reproductive/Obstetrics ? ?  ? ? ? ? ? ? ? ? ? ? ? ? ? ?  ?  ? ? ? ? ? ? ? ?Anesthesia Physical ?Anesthesia Plan ? ?ASA: 3 ? ?Anesthesia Plan: General  ? ?Post-op Pain Management: Regional block*  ? ?Induction: Intravenous ? ?PONV Risk Score and Plan: 4 or greater and Treatment may vary due to age or medical condition, Ondansetron and Dexamethasone ? ?Airway Management Planned: Oral ETT ? ?Additional Equipment: None ? ?Intra-op Plan:  ? ?Post-operative Plan: Extubation in OR ? ?Informed Consent:  ? ? ? ?Dental advisory given ? ?Plan Discussed with:  ? ?Anesthesia Plan Comments:   ? ? ? ? ? ? ?Anesthesia Quick Evaluation ? ?

## 2021-05-25 NOTE — Progress Notes (Signed)
Left message with patient voice mail surgery time change arrive 845 am, 05-26-2021 [facility],  clear liquids until 745 am then npo. If questions call [phone]. ?

## 2021-05-26 ENCOUNTER — Other Ambulatory Visit: Payer: Self-pay

## 2021-05-26 ENCOUNTER — Ambulatory Visit (HOSPITAL_BASED_OUTPATIENT_CLINIC_OR_DEPARTMENT_OTHER): Payer: Medicare HMO | Admitting: Anesthesiology

## 2021-05-26 ENCOUNTER — Ambulatory Visit (HOSPITAL_BASED_OUTPATIENT_CLINIC_OR_DEPARTMENT_OTHER)
Admission: RE | Admit: 2021-05-26 | Discharge: 2021-05-26 | Disposition: A | Discharge status: Home / Self Care | Payer: Medicare HMO | Attending: Surgery | Admitting: Surgery

## 2021-05-26 ENCOUNTER — Encounter (HOSPITAL_BASED_OUTPATIENT_CLINIC_OR_DEPARTMENT_OTHER)
Admission: RE | Disposition: A | Discharge status: Home / Self Care | Payer: Self-pay | Source: Home / Self Care | Attending: Surgery

## 2021-05-26 ENCOUNTER — Encounter (HOSPITAL_BASED_OUTPATIENT_CLINIC_OR_DEPARTMENT_OTHER): Payer: Self-pay | Admitting: Surgery

## 2021-05-26 DIAGNOSIS — Z6835 Body mass index (BMI) 35.0-35.9, adult: Secondary | ICD-10-CM | POA: Diagnosis not present

## 2021-05-26 DIAGNOSIS — I4510 Unspecified right bundle-branch block: Secondary | ICD-10-CM | POA: Diagnosis not present

## 2021-05-26 DIAGNOSIS — I10 Essential (primary) hypertension: Secondary | ICD-10-CM

## 2021-05-26 DIAGNOSIS — C20 Malignant neoplasm of rectum: Secondary | ICD-10-CM | POA: Diagnosis not present

## 2021-05-26 DIAGNOSIS — K219 Gastro-esophageal reflux disease without esophagitis: Secondary | ICD-10-CM | POA: Diagnosis not present

## 2021-05-26 DIAGNOSIS — K449 Diaphragmatic hernia without obstruction or gangrene: Secondary | ICD-10-CM

## 2021-05-26 DIAGNOSIS — I517 Cardiomegaly: Secondary | ICD-10-CM | POA: Diagnosis not present

## 2021-05-26 DIAGNOSIS — Z87891 Personal history of nicotine dependence: Secondary | ICD-10-CM | POA: Diagnosis not present

## 2021-05-26 DIAGNOSIS — E669 Obesity, unspecified: Secondary | ICD-10-CM | POA: Diagnosis not present

## 2021-05-26 DIAGNOSIS — G8929 Other chronic pain: Secondary | ICD-10-CM | POA: Diagnosis not present

## 2021-05-26 DIAGNOSIS — M1990 Unspecified osteoarthritis, unspecified site: Secondary | ICD-10-CM | POA: Diagnosis not present

## 2021-05-26 DIAGNOSIS — Z79899 Other long term (current) drug therapy: Secondary | ICD-10-CM | POA: Diagnosis not present

## 2021-05-26 DIAGNOSIS — C19 Malignant neoplasm of rectosigmoid junction: Secondary | ICD-10-CM | POA: Insufficient documentation

## 2021-05-26 HISTORY — DX: Other chronic pain: G89.29

## 2021-05-26 HISTORY — DX: Presence of spectacles and contact lenses: Z97.3

## 2021-05-26 HISTORY — DX: Deficiency of other specified B group vitamins: E53.8

## 2021-05-26 HISTORY — DX: Rosacea, unspecified: L71.9

## 2021-05-26 HISTORY — DX: Vitamin D deficiency, unspecified: E55.9

## 2021-05-26 HISTORY — PX: TRANSANAL EXCISION OF RECTAL MASS: SHX6134

## 2021-05-26 HISTORY — DX: Malignant neoplasm of rectum: C20

## 2021-05-26 HISTORY — PX: RECTAL EXAM UNDER ANESTHESIA: SHX6399

## 2021-05-26 LAB — POCT I-STAT, CHEM 8
BUN: 21 mg/dL (ref 8–23)
Calcium, Ion: 1.22 mmol/L (ref 1.15–1.40)
Chloride: 100 mmol/L (ref 98–111)
Creatinine, Ser: 0.8 mg/dL (ref 0.44–1.00)
Glucose, Bld: 117 mg/dL — ABNORMAL HIGH (ref 70–99)
HCT: 42 % (ref 36.0–46.0)
Hemoglobin: 14.3 g/dL (ref 12.0–15.0)
Potassium: 3.8 mmol/L (ref 3.5–5.1)
Sodium: 140 mmol/L (ref 135–145)
TCO2: 31 mmol/L (ref 22–32)

## 2021-05-26 SURGERY — TRANSANAL EXCISION OF RECTAL MASS
Anesthesia: General | Site: Rectum

## 2021-05-26 MED ORDER — DEXAMETHASONE SODIUM PHOSPHATE 10 MG/ML IJ SOLN
INTRAMUSCULAR | Status: AC
  Filled 2021-05-26: qty 1

## 2021-05-26 MED ORDER — LACTATED RINGERS IV SOLN: INTRAVENOUS | Status: DC

## 2021-05-26 MED ORDER — CEFOTETAN DISODIUM 2 G IJ SOLR
2.0000 g | INTRAMUSCULAR | Status: AC
  Administered 2021-05-26: 2 g via INTRAVENOUS

## 2021-05-26 MED ORDER — FENTANYL CITRATE (PF) 100 MCG/2ML IJ SOLN: 25.0000 ug | INTRAMUSCULAR | Status: DC | PRN

## 2021-05-26 MED ORDER — ACETAMINOPHEN 500 MG PO TABS
1000.0000 mg | ORAL_TABLET | ORAL | Status: AC
  Administered 2021-05-26: 1000 mg via ORAL

## 2021-05-26 MED ORDER — ROCURONIUM BROMIDE 10 MG/ML (PF) SYRINGE
PREFILLED_SYRINGE | INTRAVENOUS | Status: DC | PRN
  Administered 2021-05-26: 50 mg via INTRAVENOUS

## 2021-05-26 MED ORDER — SUGAMMADEX SODIUM 200 MG/2ML IV SOLN
INTRAVENOUS | Status: DC | PRN
  Administered 2021-05-26: 400 mg via INTRAVENOUS

## 2021-05-26 MED ORDER — 0.9 % SODIUM CHLORIDE (POUR BTL) OPTIME
TOPICAL | Status: DC | PRN
  Administered 2021-05-26: 500 mL

## 2021-05-26 MED ORDER — BUPIVACAINE LIPOSOME 1.3 % IJ SUSP
INTRAMUSCULAR | Status: DC | PRN
  Administered 2021-05-26: 20 mL

## 2021-05-26 MED ORDER — PROPOFOL 10 MG/ML IV BOLUS: INTRAVENOUS | Status: DC | PRN

## 2021-05-26 MED ORDER — ACETAMINOPHEN 500 MG PO TABS
ORAL_TABLET | ORAL | Status: AC
  Filled 2021-05-26: qty 2

## 2021-05-26 MED ORDER — DEXMEDETOMIDINE (PRECEDEX) IN NS 20 MCG/5ML (4 MCG/ML) IV SYRINGE
PREFILLED_SYRINGE | INTRAVENOUS | Status: AC
  Filled 2021-05-26: qty 5

## 2021-05-26 MED ORDER — ONDANSETRON HCL 4 MG/2ML IJ SOLN: 4.0000 mg | Freq: Once | INTRAMUSCULAR | Status: DC | PRN

## 2021-05-26 MED ORDER — LIDOCAINE 2% (20 MG/ML) 5 ML SYRINGE
INTRAMUSCULAR | Status: DC | PRN
  Administered 2021-05-26: 100 mg via INTRAVENOUS

## 2021-05-26 MED ORDER — DIBUCAINE (PERIANAL) 1 % EX OINT
TOPICAL_OINTMENT | CUTANEOUS | Status: DC | PRN
  Administered 2021-05-26: 1 "application " via RECTAL

## 2021-05-26 MED ORDER — BUPIVACAINE-EPINEPHRINE 0.25% -1:200000 IJ SOLN
INTRAMUSCULAR | Status: DC | PRN
  Administered 2021-05-26: 30 mL

## 2021-05-26 MED ORDER — PROPOFOL 10 MG/ML IV BOLUS
INTRAVENOUS | Status: DC | PRN
  Administered 2021-05-26: 100 ug via INTRAVENOUS

## 2021-05-26 MED ORDER — FENTANYL CITRATE (PF) 250 MCG/5ML IJ SOLN
INTRAMUSCULAR | Status: AC
  Filled 2021-05-26: qty 5

## 2021-05-26 MED ORDER — ROCURONIUM BROMIDE 10 MG/ML (PF) SYRINGE
PREFILLED_SYRINGE | INTRAVENOUS | Status: AC
  Filled 2021-05-26: qty 10

## 2021-05-26 MED ORDER — MIDAZOLAM HCL 5 MG/5ML IJ SOLN
INTRAMUSCULAR | Status: DC | PRN
  Administered 2021-05-26: 1 mg via INTRAVENOUS

## 2021-05-26 MED ORDER — FENTANYL CITRATE (PF) 100 MCG/2ML IJ SOLN
INTRAMUSCULAR | Status: DC | PRN
  Administered 2021-05-26: 25 ug via INTRAVENOUS
  Administered 2021-05-26: 50 ug via INTRAVENOUS
  Administered 2021-05-26: 25 ug via INTRAVENOUS
  Administered 2021-05-26: 50 ug via INTRAVENOUS

## 2021-05-26 MED ORDER — SODIUM CHLORIDE 0.9 % IV SOLN
INTRAVENOUS | Status: AC
  Filled 2021-05-26: qty 2

## 2021-05-26 MED ORDER — ACETAMINOPHEN 10 MG/ML IV SOLN: 1000.0000 mg | Freq: Once | INTRAVENOUS | Status: DC | PRN

## 2021-05-26 MED ORDER — PROPOFOL 10 MG/ML IV BOLUS
INTRAVENOUS | Status: AC
  Filled 2021-05-26: qty 20

## 2021-05-26 MED ORDER — ONDANSETRON HCL 4 MG/2ML IJ SOLN
INTRAMUSCULAR | Status: DC | PRN
  Administered 2021-05-26: 4 mg via INTRAVENOUS

## 2021-05-26 MED ORDER — FLEET ENEMA 7-19 GM/118ML RE ENEM: 1.0000 | ENEMA | Freq: Once | RECTAL | Status: DC

## 2021-05-26 MED ORDER — BUPIVACAINE LIPOSOME 1.3 % IJ SUSP: 20.0000 mL | Freq: Once | INTRAMUSCULAR | Status: DC

## 2021-05-26 MED ORDER — DEXAMETHASONE SODIUM PHOSPHATE 10 MG/ML IJ SOLN
INTRAMUSCULAR | Status: DC | PRN
  Administered 2021-05-26: 5 mg via INTRAVENOUS

## 2021-05-26 MED ORDER — ONDANSETRON HCL 4 MG/2ML IJ SOLN
INTRAMUSCULAR | Status: AC
  Filled 2021-05-26: qty 2

## 2021-05-26 MED ORDER — MIDAZOLAM HCL 2 MG/2ML IJ SOLN
INTRAMUSCULAR | Status: AC
  Filled 2021-05-26: qty 2

## 2021-05-26 SURGICAL SUPPLY — 44 items
APL SKNCLS STERI-STRIP NONHPOA (GAUZE/BANDAGES/DRESSINGS) ×1
BENZOIN TINCTURE PRP APPL 2/3 (GAUZE/BANDAGES/DRESSINGS) ×2
BLADE SURG 15 STRL LF DISP TIS (BLADE) ×1
BLADE SURG 15 STRL SS (BLADE) ×2
COVER BACK TABLE 60X90IN (DRAPES) ×2
COVER MAYO STAND STRL (DRAPES) ×2
DECANTER SPIKE VIAL GLASS SM (MISCELLANEOUS) ×2
DRAPE LAPAROTOMY 100X72 PEDS (DRAPES) ×2
DRAPE UTILITY XL STRL (DRAPES) ×2
DRSG PAD ABDOMINAL 8X10 ST (GAUZE/BANDAGES/DRESSINGS) ×2
ELECT BLADE TIP CTD 4 INCH (ELECTRODE) ×2
GAUZE 4X4 16PLY ~~LOC~~+RFID DBL (SPONGE) ×2
GAUZE SPONGE 4X4 12PLY STRL (GAUZE/BANDAGES/DRESSINGS) ×2
GLOVE SRG 8 PF TXTR STRL LF DI (GLOVE) ×1
GLOVE SURG ENC MOIS LTX SZ7.5 (GLOVE) ×4
GLOVE SURG LTX SZ6 (GLOVE) ×2
GLOVE SURG POLYISO LF SZ8.5 (GLOVE) ×2
GLOVE SURG UNDER POLY LF SZ6 (GLOVE) ×2
GLOVE SURG UNDER POLY LF SZ7 (GLOVE) ×8
GLOVE SURG UNDER POLY LF SZ8 (GLOVE) ×2
GLOVE SURG UNDER POLY LF SZ8.5 (GLOVE) ×2
GOWN STRL REUS W/ TWL LRG LVL3 (GOWN DISPOSABLE) ×1
GOWN STRL REUS W/TWL LRG LVL3 (GOWN DISPOSABLE) ×6
KIT TURNOVER CYSTO (KITS) ×2
LIGASURE 7.4 SM JAW OPEN (ELECTROSURGICAL) ×2
NEEDLE HYPO 22GX1.5 SAFETY (NEEDLE) ×2
NS IRRIG 500ML POUR BTL (IV SOLUTION) ×2
PACK BASIN DAY SURGERY FS (CUSTOM PROCEDURE TRAY) ×2
PANTS MESH DISP LRG (UNDERPADS AND DIAPERS) ×1
PANTS MESH DISPOSABLE L (UNDERPADS AND DIAPERS) ×1
PENCIL SMOKE EVACUATOR (MISCELLANEOUS) ×2
RETRACTOR RING URO 16.6X16.6 (MISCELLANEOUS) ×2
RETRACTOR STAY HOOK 5MM (MISCELLANEOUS) ×2
SUCTION FRAZIER HANDLE 10FR (MISCELLANEOUS) ×1
SUCTION TUBE FRAZIER 10FR DISP (MISCELLANEOUS) ×1
SUT SILK 2 0 SH (SUTURE) ×2
SUT VIC AB 2-0 UR6 27 (SUTURE) ×8
SYR BULB IRRIG 60ML STRL (SYRINGE) ×2
SYR CONTROL 10ML LL (SYRINGE) ×2
Surgical gown 3XL Long ×2
TOWEL OR 17X26 10 PK STRL BLUE (TOWEL DISPOSABLE) ×2
TRAY DSU PREP LF (CUSTOM PROCEDURE TRAY) ×2
TUBE CONNECTING 12X1/4 (SUCTIONS) ×2
YANKAUER SUCT BULB TIP NO VENT (SUCTIONS) ×2

## 2021-05-26 NOTE — Anesthesia Procedure Notes (Signed)
Procedure Name: Intubation ?Date/Time: 05/26/2021 11:30 AM ?Performed by: Clearnce Sorrel, CRNA ?Pre-anesthesia Checklist: Patient identified, Emergency Drugs available, Suction available and Patient being monitored ?Patient Re-evaluated:Patient Re-evaluated prior to induction ?Oxygen Delivery Method: Circle System Utilized ?Preoxygenation: Pre-oxygenation with 100% oxygen ?Induction Type: IV induction ?Ventilation: Mask ventilation without difficulty ?Laryngoscope Size: Glidescope and 3 (expected difficult intubation) ?Tube type: Oral ?Tube size: 7.0 mm ?Number of attempts: 1 ?Airway Equipment and Method: Stylet and Oral airway ?Placement Confirmation: ETT inserted through vocal cords under direct vision, positive ETCO2 and breath sounds checked- equal and bilateral ?Secured at: 22 cm ?Tube secured with: Tape ?Dental Injury: Teeth and Oropharynx as per pre-operative assessment  ? ? ? ? ?

## 2021-05-26 NOTE — Op Note (Signed)
05/26/2021 ? ?12:53 PM ? ?PATIENT:  Cynthia Bryan  76 y.o. female ? ?Patient Care Team: ?McLean-Scocuzza, Nino Glow, MD as PCP - General (Internal Medicine) ? ?PRE-OPERATIVE DIAGNOSIS:  Distal rectal cancer ? ?POST-OPERATIVE DIAGNOSIS:  Same ? ?PROCEDURE:   ?Transanal excision of distal rectal cancer ?Anorectal exam under anesthesia ? ?SURGEON:  Surgeon(s): ?Ileana Roup, MD ? ?ASSISTANT: OR staff  ? ?ANESTHESIA:   local and general ? ?SPECIMEN:  Right anterolateral distal rectal cancer (pins mark orientation - Alhaji Mcneal = distal; blue = proximal; red = posterior; green = anterior). This is oriented anatomically - anterior is to the right with the patient in the prone position, green is to the left/anterior/vaginal side. ? ?DISPOSITION OF SPECIMEN:  PATHOLOGY - I walked this to pathology myself and handed this off to our pathology PA after explaining orientation ? ?COUNTS:  Sponge, needle, and instrument counts were reported correct x2 at conclusion. ? ?EBL: 30 mL ? ?Drains: None ? ?PLAN OF CARE: Discharge to home after PACU ? ?PATIENT DISPOSITION:  PACU - hemodynamically stable. ? ?OR FINDINGS: Right anterolateral distal rectal mass coming down towards the level of the dentate line.  The MRI had noted that this was in the right posterior border but clearly on my exam, this is on the right anterior lateral position.  This is a fairly classic appearing rectal cancer with heaped up margins.  This is more intraluminal/endophytic than it is a punched-out ulceration.  It is mobile.  It was fully excised with grossly negative margins, taking at least 6 to 8 mm normal mucosal margins around it.  It was excised in a full-thickness approach.  Orientation as noted above. ? ?DESCRIPTION: ?The patient was identified in the preoperative holding area and taken to the OR. SCDs were applied. She then underwent general endotracheal anesthesia without difficulty. The patient was then rolled onto the OR table in the prone jackknife  position. Pressure points were then evaluated and padded. Benzoin was applied to the buttocks and they were gently taped apart.  She was then prepped and draped in usual sterile fashion.  A surgical timeout was performed indicating the correct patient, procedure, and positioning.  A perianal block was then created using a dilute mixture of 0.25% Marcaine with epinephrine and Exparel. ? ?After ascertaining an appropriate level of anesthesia had been achieved, a well lubricated digital rectal exam was performed. This demonstrated mass palpable in the right anterolateral position that is mobile.  A Hill-Ferguson anoscope was into the anal canal and circumferential inspection demonstrated this mass to have the appearance as noted above, classic for a distal rectal adenocarcinoma. ? ?Given its location, size, and mobility, as well as her preoperative discussions, a transanal excision approach was employed.  A Parks retractor was placed.  The lesion is marked circumferentially with grossly normal margins between 6 and 8 mm.  With electrocautery, the lesion is then incised first anteriorly and distally and completed laterally/posteriorly.  The deepest margins which are again full-thickness were taken using a hand-held LigaSure device to ensure hemostasis as it is a fairly well perfused lesion. A small portion of the internal sphincter is potentially involved and included with the specimen.  There is no involvement in the external sphincter.  The wound bed is irrigated and hemostasis verified/achieved with electrocautery.  The specimen orientation was maintained and then placed onto a cork board.  Pins were placed to mark the orientation as noted above.  This was placed into a specimen container covered with  saline. ? ?Gown/gloves were changed.  We then directed our attention to closure.  Hemostasis was again verified.  The rectal mucosa was approximated to about the level of the dentate line using interrupted 2-0 Vicryl  sutures.  The anal canal was then irrigated and hemostasis verified.  Our closure was reinspected and noted to be intact.  There is no significant narrowing of the anal canal or mucosal ectropion's evident.  All counts were reported correct.  Additional local anesthetic was infiltrated.  Topical Dibucaine is applied.  The buttocks are untaped.  A dressing consisting of 4 x 4's, ABD, mesh underwear was placed.  She was rolled back onto a stretcher, awake from anesthesia, extubated, transported to recovery in satisfactory condition. ? ?Of note, I then walked the specimen down to pathology.  This was handed to the pathology PA and we explained orientation. ? ?DISPOSITION: PACU in satisfactory condition. ?

## 2021-05-26 NOTE — Anesthesia Postprocedure Evaluation (Signed)
Anesthesia Post Note ? ?Patient: Cynthia Bryan ? ?Procedure(s) Performed: TRANSANAL EXCISION OF RIGHT ANTERIOR LATERAL DISTAL RECTAL CANCER (Rectum) ?ANORECTAL EXAM UNDER ANESTHESIA (Rectum) ? ?  ? ?Patient location during evaluation: PACU ?Anesthesia Type: General ?Level of consciousness: awake and alert ?Pain management: pain level controlled ?Vital Signs Assessment: post-procedure vital signs reviewed and stable ?Respiratory status: spontaneous breathing, nonlabored ventilation, respiratory function stable and patient connected to nasal cannula oxygen ?Cardiovascular status: blood pressure returned to baseline and stable ?Postop Assessment: no apparent nausea or vomiting ?Anesthetic complications: no ? ? ?No notable events documented. ? ?Last Vitals:  ?Vitals:  ? 05/26/21 0912 05/26/21 1240  ?BP: (!) 157/72 (!) 152/49  ?Pulse: 78 92  ?Resp: 20 17  ?Temp: 37.1 ?C 36.5 ?C  ?SpO2: 98% 97%  ?  ?Last Pain:  ?Vitals:  ? 05/26/21 1240  ?TempSrc:   ?PainSc: 0-No pain  ? ? ?  ?  ?  ?  ?  ?  ? ?[name] ? ? ? ? ?

## 2021-05-26 NOTE — Transfer of Care (Signed)
Immediate Anesthesia Transfer of Care Note ? ?Patient: Cynthia Bryan ? ?Procedure(s) Performed: TRANSANAL EXCISION OF RIGHT ANTERIOR LATERAL DISTAL RECTAL CANCER (Rectum) ?ANORECTAL EXAM UNDER ANESTHESIA (Rectum) ? ?Patient Location: PACU ? ?Anesthesia Type:General ? ?Level of Consciousness: awake, alert  and oriented ? ?Airway & Oxygen Therapy: Patient Spontanous Breathing ? ?Post-op Assessment: Report given to RN and Post -op Vital signs reviewed and stable ? ?Post vital signs: Reviewed and stable ? ?Last Vitals:  ?Vitals Value Taken Time  ?BP 152/49 05/26/21 1238  ?Temp    ?Pulse 89 05/26/21 1241  ?Resp 16 05/26/21 1241  ?SpO2 95 % 05/26/21 1241  ?Vitals shown include unvalidated device data. ? ?Last Pain:  ?Vitals:  ? 05/26/21 0912  ?TempSrc: Oral  ?PainSc: 0-No pain  ?   ? ?Patients Stated Pain Goal: 3 (05/26/21 0912) ? ?Complications: No notable events documented. ?

## 2021-05-26 NOTE — H&P (Signed)
 "  CC: Here today for surgery  HPI: Cynthia Bryan is an 76 y.o. female with history of HTN, HLD, GERD, Gout, whom is seen in the office today as a referral by Dr. Samuella for evaluation of newly diagnosed distal rectal/anal rectal cancer.  She reports she had a colonoscopy back in 2006 and was told she would need a repeat in approximately 10 years. COVID hit and she had not scheduled. She did have a Cologuard completed through her primary care that was positive and triggered a colonoscopy. She underwent colonoscopy with Dr. Aundria in [place], [place] -Per the notes in epic, she was found to have an infiltrative, sessile, ulcerated nonobstructing mass in the distal rectum measuring 2 cm in length and diameter, 0 to 3 cm from the anal verge. 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  MRI pelvis rectal cancer protocoled 05/02/2021 demonstrated the rectal mass to be at the level of the internal anal sphincter. CmriT2N0; no lvi/emvi. Posterior right region of anorectal junction.  CT CAP 05/09/21 -no evidence of metastatic disease. Hepatic steatosis. Kidney stones. Aortic atherosclerosis.  Labs 04/25/21 Albumin 4.4 TBili 0.8 Liver enz nml  CEA: 1.8 (nml) Hgb 13.7  She denies any complaints at this time or changes in her health / history since we met in the office. She denies any anal pain, rectal bleeding, blood in her stool. She otherwise feels well.   PMH: HTN, HLD, GERD, gout  PSH: Right knee surgery; denies any prior abdominal or pelvic surgeries  FHx: Paternal aunt had colon cancer; her mother had breast cancer in her 14s. Denies any other known family history of colorectal, breast, endometrial or ovarian cancer  Social Hx: Denies use of tobacco/EtOH/illicit drug. She is happily retired, previously worked as a psychologist, occupational  Past Medical History:  Diagnosis Date   Arthritis    back knees, shoulders    Basal cell carcinoma    right temple Dr. Therisa Molly removed 01/2017    Chicken pox    as child   Chronic back pain    COVID-19 10/13/2019   scratchy throat chills fever x 6 weeks x 6 weeks all symptoms resolved   Fatty liver    09/19/15   Gastric ulcer    neg H pylori  diet controlled   GERD (gastroesophageal reflux disease)    Gout    Hiatal hernia    SMALL   History of kidney stones    Hyperlipidemia    Hypertension    Lumbar herniated disc    Rectal cancer (HCC)    Recurrent UTI    last uti 6 months ago per pt on 05-23-2021   Rosacea    Syncope none since 2018 per pt    per pt presyncope no LOC felt dizzy/vision distorted and couldnt speak EEG neg, CT head neg 07/31/16, f/u [facility] with US  carotid, echo but no Holter    Umbilical hernia    noted 09/19/15    UTI (urinary tract infection)    Vitamin B 12 deficiency    Vitamin D  deficiency    Wears glasses     Past Surgical History:  Procedure Laterality Date   BREAST BIOPSY Right 1980   cyst removed negative pathology    colonscopy  04/17/2021   at [facility]   CYSTOSCOPY W/ RETROGRADES Left 02/18/2020   Procedure: CYSTOSCOPY WITH RETROGRADE PYELOGRAM;  Surgeon: Kassie Ozell SAUNDERS, MD;  Location: [facility] ORS;  Service: Urology;  Laterality: Left;  EXTRACORPOREAL SHOCK WAVE LITHOTRIPSY Left 09/19/2017   Procedure: EXTRACORPOREAL SHOCK WAVE LITHOTRIPSY (ESWL);  Surgeon: Kassie Ozell SAUNDERS, MD;  Location: [facility] OR[facility];  Service: Urology;  Laterality: Left;   JOINT REPLACEMENT     right knee 11/2015 Dr. Elgin Benjamin [facility] [place]    Family History  Problem Relation Age of Onset   Stroke Mother    Breast cancer Mother 66   Cancer Mother        breast cancer s/p masectomy lived to be 29    Hypertension Mother    Cancer Father        NMSC skin cancer    Gout Father    Heart disease Father        s/p bypass surgery age 49 lived 5 months after surgery    Kidney disease Father        RAS>kidney failure    Bladder Cancer  Brother    Hyperlipidemia Paternal Aunt    Stroke Paternal Aunt    Breast cancer Paternal Aunt    Rectal cancer Paternal Aunt    Kidney cancer Neg Hx    Prolactinoma Neg Hx     Social:  reports that she quit smoking about 27 years ago. Her smoking use included cigarettes. She has a 25.00 pack-year smoking history. She has never used smokeless tobacco. She reports that she does not drink alcohol and does not use drugs.  Allergies:  Allergies  Allergen Reactions   Nortriptyline Nausea Only   Sulfa Antibiotics Rash    Medications: I have reviewed the patient's current medications.  No results found for this or any previous visit (from the past 48 hour(s)).  No results found.  ROS - all of the below systems have been reviewed with the patient and positives are indicated with bold text General: chills, fever or night sweats Eyes: blurry vision or double vision ENT: epistaxis or sore throat Allergy/Immunology: itchy/watery eyes or nasal congestion Hematologic/Lymphatic: bleeding problems, blood clots or swollen lymph nodes Endocrine: temperature intolerance or unexpected weight changes Breast: new or changing breast lumps or nipple discharge Resp: cough, shortness of breath, or wheezing CV: chest pain or dyspnea on exertion GI: as per HPI GU: dysuria, trouble voiding, or hematuria MSK: joint pain or joint stiffness Neuro: TIA or stroke symptoms Derm: pruritus and skin lesion changes Psych: anxiety and depression  PE Height 5' 6 (1.676 m), weight 98.9 kg. Constitutional: NAD; conversant Eyes: Moist conjunctiva; no lid lag; PERRL Lungs: Normal respiratory effort CV: RRR; no palpable thrills; no pitting edema GI: Abd soft, NT/ND; no palpable hepatosplenomegaly MSK: Normal range of motion of extremities Psychiatric: Appropriate affect; alert and oriented x3  No results found for this or any previous visit (from the past 48 hour(s)).  No results found.  A/P: Cynthia Bryan is an 76 y.o. female with hx of HTN, HLD, GERD, gout here for evaluation of newly diagnosed distal rectal/anorectal mod diff adenocarcinoma CmriT2N0, no lvi/pni  -The anatomy and physiology of the GI tract was reviewed with the patient. The pathophysiology of anorectal cancer was discussed as well with associated pictures. -We have discussed various different treatment options going forward. Given the location of this lesion being in the distalmost rectum encroaching into the proximalmost anal canal and clinically being a possible T2 on the MRI, discussed potentially the most aggressive approach to this being followed essentially be an abdominoperineal resection with a permanent end colostomy. That said, we also discussed the possibility of over-staging from the  MRI. Clinically, this lesion is mobile and relatively small. We discussed the possibility of transanal excision as an alternative but with a higher risk of local recurrence and/or even lymph node metastases and T2 lesions. If this were on final pathology found to be a T2 or greater lesion or adverse pathologic features, the potential need for additional treatment which could be chemo/radiation and/or even surgery. We also discussed the results of the watch and wait trials where chemo +chemo/chemoradiation have shown potential for complete pathologic response in 30%. -She would feel most comfortable with proceeding with a transanal excision for further pathologic evaluation/characterization. Based on results of this, considering additional treatment options if necessary.  -Case presented/reviewed at MDT conference - consensus plan was for transanal excision based on above.  -We therefore discussed transanal excision of distal rectal cancer/anorectal cancer; anorectal exam under anesthesia.  -The planned procedure, material risks (including, but not limited to, pain, bleeding, infection, scarring, need for blood transfusion, damage to  surrounding structures- blood vessels/nerves/viscus/organs, leak from anastomosis, need for additional procedures, margin positivity, scenarios where a stoma may be necessary and where it may be permanent, worsening of pre-existing medical conditions, recurrence, pneumonia, heart attack, stroke, death) benefits and alternatives to surgery were discussed at length. The patient's questions were answered to her satisfaction, she voiced understanding and elected to proceed with surgery. Additionally, we discussed typical postoperative expectations and the recovery process.    Lonni Pizza, MD [facility] "

## 2021-05-26 NOTE — Discharge Instructions (Addendum)
ANORECTAL SURGERY: POST OP INSTRUCTIONS ? ?DIET: Follow a light bland diet the first 24 hours after arrival home, such as soup, liquids, crackers, etc.  Be sure to include lots of fluids daily.  Avoid fast food or heavy meals as your are more likely to get nauseated.  Eat a low fat diet the next few days after surgery.   ?Some bleeding with bowel movements is expected for the first couple of days but this should stop in between bowel movements ? ?Take your usually prescribed home medications unless otherwise directed. ? ?PAIN CONTROL: ?It is helpful to take an over-the-counter pain medication regularly for the first few days/weeks.  Choose from the following that works best for you: ?Ibuprofen (Advil, etc) Three '200mg'$  tabs every 6 hours as needed. ?Acetaminophen (Tylenol, etc) 500-'650mg'$  every 6 hours as needed ?NOTE: You may take both of these medications together - most patients find it most helpful when alternating between the two (i.e. Ibuprofen at 6am, tylenol at 9am, ibuprofen at 12pm ...) ?A  prescription for pain medication may have been prescribed for you at discharge.  Take your pain medication as prescribed.  ?If you are having problems/concerns with the prescription medicine, please call us for further advice. ? ?Avoid getting constipated.  Between the surgery and the pain medications, it is common to experience some constipation.  Increasing fluid intake (64oz of water per day) and taking a fiber supplement (such as Metamucil, Citrucel, FiberCon) 1-2 times a day regularly will usually help prevent this problem from occurring.  Take Miralax (over the counter) 1-2x/day while taking a narcotic pain medication. If no bowel movement after 48hours, you may additionally take a laxative like a bottle of Milk of Magnesia which can be purchased over the counter. Avoid enemas if possible as these are often painful. ?  ?Watch out for diarrhea.  If you have many loose bowel movements, simplify your diet to bland  foods.  Stop any stool softeners and decrease your fiber supplement. If this worsens or does not improve, please call us. ? ?Wash / shower every day.  If you were discharged with a dressing, you may remove this the day after your surgery. You may shower normally, getting soap/water on your wound, particularly after bowel movements. ? ?Soaking in a warm bath filled a couple inches ("Sitz bath") is a great way to clean the area after a bowel movement and many patients find it is a way to soothe the area. ? ?ACTIVITIES as tolerated:   ?You may resume regular (light) daily activities beginning the next day--such as daily self-care, walking, climbing stairs--gradually increasing activities as tolerated.  If you can walk 30 minutes without difficulty, it is safe to try more intense activity such as jogging, treadmill, bicycling, low-impact aerobics, etc. ?Refrain from any heavy lifting or straining for the first 2 weeks after your procedure, particularly if your surgery was for hemorrhoids. ?Avoid activities that make your pain worse ?You may drive when you are no longer taking prescription pain medication, you can comfortably wear a seatbelt, and you can safely maneuver your car and apply brakes. ? ?FOLLOW UP in our office ?Please call [facility] at [phone] to set up an appointment to see your surgeon in the office for a follow-up appointment approximately 2 weeks after your surgery. ?Make sure that you call for this appointment the day you arrive home to insure a convenient appointment time. ? ?9. If you have disability or family leave forms that need to be completed,  you may have them completed by your primary care physician's office; for return to work instructions, please ask our office staff and they will be happy to assist you in obtaining this documentation ?  ?When to call us [phone]: ?Poor pain control ?Reactions / problems with new medications (rash/itching, etc)  ?Fever over 101.5 F (38.5 C) ?Inability  to urinate ?Nausea/vomiting ?Worsening swelling or bruising ?Continued bleeding from incision. ?Increased pain, redness, or drainage from the incision ? ?The clinic staff is available to answer your questions during regular business hours (8:30am-5pm).  Please don?t hesitate to call and ask to speak to one of our nurses for clinical concerns.   A surgeon from [facility] is always on call at the hospitals ?  ?If you have a medical emergency, go to the nearest emergency room or call 911. ?  ?[facility] ?[facility] ?[address], [place], [place], [place]  [address] ?MAIN: [phone] ?FAX: [phone] ?[email or web address] ? ? ?Post Anesthesia Home Care Instructions ? ?Activity: ?Get plenty of rest for the remainder of the day. A responsible adult should stay with you for 24 hours following the procedure.  ?For the next 24 hours, DO NOT: ?-Drive a car ?-Paediatric nurse ?-Drink alcoholic beverages ?-Take any medication unless instructed by your physician ?-Make any legal decisions or sign important papers. ? ?Meals: ?Start with liquid foods such as gelatin or soup. Progress to regular foods as tolerated. Avoid greasy, spicy, heavy foods. If nausea and/or vomiting occur, drink only clear liquids until the nausea and/or vomiting subsides. Call your physician if vomiting continues. ? ?Special Instructions/Symptoms: ?Your throat may feel dry or sore from the anesthesia or the breathing tube placed in your throat during surgery. If this causes discomfort, gargle with warm salt water. The discomfort should disappear within 24 hours. ? ?If you had a scopolamine patch placed behind your ear for the management of post- operative nausea and/or vomiting: ? ?1. The medication in the patch is effective for 72 hours, after which it should be removed.  Wrap patch in a tissue and discard in the trash. Wash hands thoroughly with soap and water. ?2. You may remove the  patch earlier than 72 hours if you experience unpleasant side effects which may include dry mouth, dizziness or visual disturbances. ?3. Avoid touching the patch. Wash your hands with soap and water after contact with the patch. ?  Information for Discharge Teaching: ?EXPAREL (bupivacaine liposome injectable suspension)  ? ?Your surgeon gave you EXPAREL(bupivacaine) in your surgical incision to help control your pain after surgery.  ?EXPAREL is a local anesthetic that provides pain relief by numbing the tissue around the surgical site. ?EXPAREL is designed to release pain medication over time and can control pain for up to 72 hours. ?Depending on how you respond to EXPAREL, you may require less pain medication during your recovery. ? ?Possible side effects: ?Temporary loss of sensation or ability to move in the area where bupivacaine was injected. ?Nausea, vomiting, constipation ?Rarely, numbness and tingling in your mouth or lips, lightheadedness, or anxiety may occur. ?Call your doctor right away if you think you may be experiencing any of these sensations, or if you have other questions regarding possible side effects. ? ?Follow all other discharge instructions given to you by your surgeon or nurse. Eat a healthy diet and drink plenty of water or other fluids. ? ?If you return to the hospital for any reason within 96 hours following the administration  of EXPAREL, please inform your health care providers.  ?

## 2021-05-29 ENCOUNTER — Encounter (HOSPITAL_BASED_OUTPATIENT_CLINIC_OR_DEPARTMENT_OTHER): Payer: Self-pay | Admitting: Surgery

## 2021-05-29 ENCOUNTER — Other Ambulatory Visit: Payer: Self-pay

## 2021-05-30 LAB — SURGICAL PATHOLOGY

## 2021-06-05 NOTE — Progress Notes (Incomplete)
?Radiation Oncology         [phone] ?________________________________ ? ?Name: Cynthia Bryan        MRN: [id number]  ?Date of Service: 06/08/2021 DOB: 02-Jul-1945 ? ?LO:[id number], Nino Glow, MD  Ileana Roup, MD    ? ?REFERRING PHYSICIAN: Ileana Roup, MD ? ? ?DIAGNOSIS: The encounter diagnosis was Rectal cancer ([place]). ? ? ?HISTORY OF PRESENT ILLNESS: Cynthia Bryan is a 76 y.o. female seen at the request of Dr. Dema Severin for a newly diagnosed rectal cancer.  She recently had a colonoscopy showing a rectal mass on digital examination 1 to 2 to 3 cm from the anal verge, it was nonobstructing and measured 2 cm in length.  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  She underwent an MRI of the pelvis on 04/28/2021 which showed T2N0 disease.  Additional staging CTs on 05/09/2021 showed no evidence of metastatic findings.  She underwent transanal excision on 05/26/2021, and final pathology showed a 2.5 cm invasive moderately differentiated adenocarcinoma focally invading into the superficial muscularis propria.  Her resection margins were negative for carcinoma.  Given these findings of invasion of the muscularis she is seen to discuss chemoradiation. ? ? ? ?PREVIOUS RADIATION THERAPY: {EXAM; YES/NO:19492::"No"} ? ? ?PAST MEDICAL HISTORY:  ?Past Medical History:  ?Diagnosis Date  ? Arthritis   ? back knees, shoulders  ? Basal cell carcinoma   ? right temple Dr. Elmer Ramp removed 01/2017   ? Chicken pox   ? as child  ? Chronic back pain   ? COVID-19 10/13/2019  ? scratchy throat chills fever x 6 weeks x 6 weeks all symptoms resolved  ? Fatty liver   ? 09/19/15  ? Gastric ulcer   ? neg H pylori  diet controlled  ? GERD (gastroesophageal reflux disease)   ? Gout   ? Hiatal hernia   ? SMALL  ? History of kidney stones   ? Hyperlipidemia   ? Hypertension   ? Lumbar herniated disc   ? Rectal  cancer ([place])   ? Recurrent UTI   ? last uti 6 months ago per pt on 05-23-2021  ? Rosacea   ? Syncope none since 2018 per pt   ? per pt presyncope no LOC felt dizzy/vision distorted and couldnt speak EEG neg, CT head neg 07/31/16, f/u [facility] with US carotid, echo but no Holter   ? Umbilical hernia   ? noted 09/19/15   ? UTI (urinary tract infection)   ? Vitamin B 12 deficiency   ? Vitamin D deficiency   ? Wears glasses   ?   ? ? ?PAST SURGICAL HISTORY: ?Past Surgical History:  ?Procedure Laterality Date  ? BREAST BIOPSY Right 1980  ? cyst removed negative pathology   ? colonscopy  04/17/2021  ? [facility]  ? CYSTOSCOPY W/ RETROGRADES Left 02/18/2020  ? Procedure: CYSTOSCOPY WITH RETROGRADE PYELOGRAM;  Surgeon: Royston Cowper, MD;  Location: [facility];  Service: Urology;  Laterality: Left;  ? EXTRACORPOREAL SHOCK WAVE LITHOTRIPSY Left 09/19/2017  ? Procedure: EXTRACORPOREAL SHOCK WAVE LITHOTRIPSY (ESWL);  Surgeon: Royston Cowper, MD;  Location: [facility];  Service: Urology;  Laterality: Left;  ? JOINT REPLACEMENT    ? right knee 11/2015 Dr. Janell Quiet [facility]  ? RECTAL EXAM UNDER ANESTHESIA N/A 05/26/2021  ? Procedure: ANORECTAL EXAM UNDER ANESTHESIA;  Surgeon: Ileana Roup, MD;  Location: [facility];  Service: General;  Laterality: N/A;  ? TRANSANAL EXCISION OF RECTAL MASS N/A 05/26/2021  ? Procedure: TRANSANAL EXCISION OF RIGHT ANTERIOR LATERAL DISTAL RECTAL CANCER;  Surgeon: Ileana Roup, MD;  Location: [facility];  Service: General;  Laterality: N/A;  ? ? ? ?FAMILY HISTORY:  ?Family History  ?Problem Relation Age of Onset  ? Stroke Mother   ? Breast cancer Mother 46  ? Cancer Mother   ?     breast cancer s/p masectomy lived to be 72   ? Hypertension Mother   ? Cancer Father   ?     NMSC skin cancer   ? Gout Father   ? Heart disease Father   ?     s/p bypass surgery age 13 lived 23 months after surgery   ? Kidney disease Father   ?     RAS>kidney failure   ?  Bladder Cancer Brother   ? Hyperlipidemia Paternal Aunt   ? Stroke Paternal Aunt   ? Breast cancer Paternal Aunt   ? Rectal cancer Paternal Aunt   ? Kidney cancer Neg Hx   ? Prolactinoma Neg Hx   ? ? ? ?SOCIAL HISTORY:  reports that she quit smoking about 27 years ago. Her smoking use included cigarettes. She has a 25.00 pack-year smoking history. She has never used smokeless tobacco. She reports that she does not drink alcohol and does not use drugs. ? ? ?ALLERGIES: Nortriptyline and Sulfa antibiotics ? ? ?MEDICATIONS:  ?Current Outpatient Medications  ?Medication Sig Dispense Refill  ? acetaminophen (TYLENOL) 500 MG tablet Take 1,000 mg by mouth 2 (two) times daily.     ? allopurinol (ZYLOPRIM) 300 MG tablet Take 1 tablet (300 mg total) by mouth daily. 90 tablet 3  ? amLODipine (NORVASC) 2.5 MG tablet Take 1-2 tablets (2.5-5 mg total) by mouth daily. Start with 2.5 mg dose if BP >130/>80 take 2 pills =5 mg (Patient taking differently: Take 5 mg by mouth daily.) 180 tablet 3  ? atorvastatin (LIPITOR) 40 MG tablet Take 1 tablet (40 mg total) by mouth daily at 6 PM. 90 tablet 3  ? Cholecalciferol (VITAMIN D3) 5000 units CAPS Take 5,000 Units by mouth daily.     ? Cyanocobalamin (B-12) 5000 MCG CAPS Take 5,000 mcg by mouth daily.     ? ezetimibe (ZETIA) 10 MG tablet Take 1 tablet (10 mg total) by mouth at bedtime. 90 tablet 3  ? gabapentin (NEURONTIN) 300 MG capsule Take 1 capsule (300 mg total) by mouth at bedtime. 90 capsule 3  ? ketoconazole (NIZORAL) 2 % cream Apply 1 application topically 2 (two) times daily as needed for irritation. FACE 30 g 11  ? Magnesium 500 MG CAPS Take 500 mg by mouth at bedtime.     ? methocarbamol (ROBAXIN) 750 MG tablet TAKE ONE TABLET BY MOUTH EVERY NIGHT AT BEDTIME AS NEEDED MUSCLE SPASMS (Patient taking differently: at bedtime.) 30 tablet 11  ? pantoprazole (PROTONIX) 40 MG tablet Take 1 tablet (40 mg total) by mouth at bedtime. 90 tablet 3  ? potassium chloride (MICRO-K) 10 MEQ CR  capsule Take 2 capsules (20 mEq total) by mouth every morning. 180 capsule 3  ? traMADol (ULTRAM) 50 MG tablet TAKE TWO TABLETS BY MOUTH THREE TIMES A DAY AS NEEDED (Patient taking differently: Takes 1 tab in am , 2 at hs) 170 tablet 5  ? triamterene-hydrochlorothiazide (MAXZIDE-25) 37.5-25 MG tablet Take 1 tablet by mouth every morning. 90 tablet  3  ? ?No current facility-administered medications for this visit.  ? ? ? ?REVIEW OF SYSTEMS: On review of systems, the patient reports that *** is doing well overall. *** denies any chest pain, shortness of breath, cough, fevers, chills, night sweats, unintended weight changes. *** denies any bowel or bladder disturbances, and denies abdominal pain, nausea or vomiting. *** denies any new musculoskeletal or joint aches or pains. A complete review of systems is obtained and is otherwise negative. ? ?  ? ?PHYSICAL EXAM:  ?Wt Readings from Last 3 Encounters:  ?05/26/21 215 lb 11.2 oz (97.8 kg)  ?04/25/21 218 lb 8 oz (99.1 kg)  ?01/31/21 218 lb 3.2 oz (99 kg)  ? ?Temp Readings from Last 3 Encounters:  ?05/26/21 97.8 ?F (36.6 ?C)  ?04/25/21 (!) 97.5 ?F (36.4 ?C)  ?01/31/21 (!) 97.3 ?F (36.3 ?C) (Temporal)  ? ?BP Readings from Last 3 Encounters:  ?05/26/21 140/75  ?04/25/21 (!) 154/69  ?01/31/21 138/88  ? ?Pulse Readings from Last 3 Encounters:  ?05/26/21 85  ?04/25/21 86  ?01/31/21 77  ? ? /10 ? ?In general this is a well appearing *** in no acute distress. ***'s alert and oriented x4 and appropriate throughout the examination. Cardiopulmonary assessment is negative for acute distress and *** exhibits normal effort.  ? ? ? ?ECOG = *** ? ?0 - Asymptomatic (Fully active, able to carry on all predisease activities without restriction) ? ?1 - Symptomatic but completely ambulatory (Restricted in physically strenuous activity but ambulatory and able to carry out work of a light or sedentary nature. For example, light housework, office work) ? ?2 - Symptomatic, <50% in bed during the  day (Ambulatory and capable of all self care but unable to carry out any work activities. Up and about more than 50% of waking hours) ? ?3 - Symptomatic, >50% in bed, but not bedbound (Capable of only l

## 2021-06-07 ENCOUNTER — Other Ambulatory Visit: Payer: Self-pay

## 2021-06-07 NOTE — Progress Notes (Signed)
The proposed treatment discussed in conference is for discussion purpose only and is not a binding recommendation.  The patients have not been physically examined, or presented with their treatment options.  Therefore, final treatment plans cannot be decided.  

## 2021-06-08 ENCOUNTER — Ambulatory Visit: Payer: Medicare HMO

## 2021-06-08 ENCOUNTER — Ambulatory Visit: Payer: Medicare HMO | Admitting: Radiation Oncology

## 2021-06-13 ENCOUNTER — Encounter: Payer: Self-pay | Admitting: Oncology

## 2021-06-13 ENCOUNTER — Inpatient Hospital Stay: Payer: Medicare HMO | Attending: Oncology | Admitting: Oncology

## 2021-06-13 ENCOUNTER — Encounter: Payer: Self-pay | Admitting: Radiation Oncology

## 2021-06-13 ENCOUNTER — Ambulatory Visit
Admission: RE | Admit: 2021-06-13 | Discharge: 2021-06-13 | Disposition: A | Discharge status: Home / Self Care | Payer: Medicare HMO | Source: Ambulatory Visit | Attending: Radiation Oncology | Admitting: Radiation Oncology

## 2021-06-13 VITALS — BP 165/80 | HR 86 | Temp 97.6°F | Resp 18 | Ht 66.0 in | Wt 218.7 lb

## 2021-06-13 VITALS — BP 165/80 | HR 86 | Temp 97.6°F | Resp 18 | Wt 218.0 lb

## 2021-06-13 DIAGNOSIS — Z7189 Other specified counseling: Secondary | ICD-10-CM

## 2021-06-13 DIAGNOSIS — E785 Hyperlipidemia, unspecified: Secondary | ICD-10-CM | POA: Insufficient documentation

## 2021-06-13 DIAGNOSIS — Z8616 Personal history of COVID-19: Secondary | ICD-10-CM | POA: Insufficient documentation

## 2021-06-13 DIAGNOSIS — I10 Essential (primary) hypertension: Secondary | ICD-10-CM | POA: Insufficient documentation

## 2021-06-13 DIAGNOSIS — M129 Arthropathy, unspecified: Secondary | ICD-10-CM | POA: Insufficient documentation

## 2021-06-13 DIAGNOSIS — K449 Diaphragmatic hernia without obstruction or gangrene: Secondary | ICD-10-CM | POA: Insufficient documentation

## 2021-06-13 DIAGNOSIS — Z803 Family history of malignant neoplasm of breast: Secondary | ICD-10-CM | POA: Insufficient documentation

## 2021-06-13 DIAGNOSIS — E559 Vitamin D deficiency, unspecified: Secondary | ICD-10-CM | POA: Insufficient documentation

## 2021-06-13 DIAGNOSIS — Z809 Family history of malignant neoplasm, unspecified: Secondary | ICD-10-CM

## 2021-06-13 DIAGNOSIS — C20 Malignant neoplasm of rectum: Secondary | ICD-10-CM | POA: Diagnosis present

## 2021-06-13 DIAGNOSIS — Z85828 Personal history of other malignant neoplasm of skin: Secondary | ICD-10-CM | POA: Insufficient documentation

## 2021-06-13 DIAGNOSIS — Z87891 Personal history of nicotine dependence: Secondary | ICD-10-CM | POA: Insufficient documentation

## 2021-06-13 DIAGNOSIS — E538 Deficiency of other specified B group vitamins: Secondary | ICD-10-CM | POA: Insufficient documentation

## 2021-06-13 DIAGNOSIS — Z87442 Personal history of urinary calculi: Secondary | ICD-10-CM | POA: Insufficient documentation

## 2021-06-13 DIAGNOSIS — Z79899 Other long term (current) drug therapy: Secondary | ICD-10-CM | POA: Insufficient documentation

## 2021-06-13 DIAGNOSIS — K219 Gastro-esophageal reflux disease without esophagitis: Secondary | ICD-10-CM | POA: Insufficient documentation

## 2021-06-13 DIAGNOSIS — Z8551 Personal history of malignant neoplasm of bladder: Secondary | ICD-10-CM | POA: Diagnosis not present

## 2021-06-13 NOTE — Consult Note (Signed)
 " NEW PATIENT EVALUATION  Name: Cynthia Bryan  MRN: [id number]  Date:   06/13/2021     DOB: June 27, 1945   This 76 y.o. 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  REFERRING PHYSICIAN: McLean-Scocuzza, Randine *  CHIEF COMPLAINT:  Chief Complaint  Patient presents with   Consult    Rectal Cancer    DIAGNOSIS: The encounter diagnosis was Rectal cancer (HCC).   PREVIOUS INVESTIGATIONS:  MRI and CT scans reviewed Pathology reports reviewed Clinical notes reviewed  HPI: Patient is a 76 year old female who presented with a positive Cologuard exam.  At time of colonoscopy she had a rectal mass approximately 3 cm from the anal verge which biopsy showed moderately differentiated adenocarcinoma of the anorectal junction with adenomatous high-grade dysplasia.  MRI scan of her pelvis demonstrated a T2N0 adenocarcinoma with distance to the internal anal sphincter at 0 cm.  Tumor was present at the anorectal junction.  CT scan of chest abdomen pelvis showed no evidence of metastatic disease within the chest or abdomen.  Patient went transanal excision showing invasive moderately differentiated adenocarcinoma 2.5 cm with tumor focally invading into the superficial muscularis propria.  Months margins of resection were negative.  No evidence of lymph-vascular invasion was noted.  No lymph nodes were submitted.  Based on recommendations from adjuvant up-to-date recommendation is now for concurrent chemoradiation therapy based on the transanal surgery as well as lack of pathologic evaluation of lymph nodes.  She is doing well her bowels are normal she is not experiencing any significant constipation or diarrhea.  She is having no abdominal pain or discomfort.  PLANNED TREATMENT REGIMEN: Whole pelvic radiation  PAST MEDICAL HISTORY:  has a past medical history of Arthritis, Basal cell carcinoma, Chicken pox,  Chronic back pain, COVID-19 (10/13/2019), Fatty liver, Gastric ulcer, GERD (gastroesophageal reflux disease), Gout, Hiatal hernia, History of kidney stones, Hyperlipidemia, Hypertension, Lumbar herniated disc, Rectal cancer (HCC), Recurrent UTI, Rosacea, Syncope none since 2018 per pt, Umbilical hernia, UTI (urinary tract infection), Vitamin B 12 deficiency, Vitamin D  deficiency, and Wears glasses.    PAST SURGICAL HISTORY:  Past Surgical History:  Procedure Laterality Date   BREAST BIOPSY Right 1980   cyst removed negative pathology    [facility]  [facility]17/[facility]   [facility]   CYSTOSCOPY W/ RETROGRADES Left 02/18/2020   Procedure: CYSTOSCOPY WITH RETROGRADE PYELOGRAM;  Surgeon: Kassie Ozell SAUNDERS, MD;  Location: [facility];  Service: Urology;  Laterality: Left;   EXTRACORPOREAL SHOCK WAVE LITHOTRIPSY Left 09/19/2017   Procedure: EXTRACORPOREAL SHOCK WAVE LITHOTRIPSY (ESWL);  Surgeon: Kassie Ozell SAUNDERS, MD;  Location: [facility];  Service: Urology;  Laterality: Left;   JOINT REPLACEMENT     right knee 11/2015 Dr. Elgin Benjamin [facility]   RECTAL EXAM UNDER ANESTHESIA N/A 05/26/2021   Procedure: ANORECTAL EXAM UNDER ANESTHESIA;  Surgeon: Teresa Lonni HERO, MD;  Location: [facility];  Service: General;  Laterality: N/A;   TRANSANAL EXCISION OF RECTAL MASS N/A 05/26/2021   Procedure: TRANSANAL EXCISION OF RIGHT ANTERIOR LATERAL DISTAL RECTAL CANCER;  Surgeon: Teresa Lonni HERO, MD;  Location: [facility];  Service: General;  Laterality: N/A;    FAMILY HISTORY: family history includes Bladder Cancer in her brother; Breast cancer in her paternal aunt; Breast cancer (age of onset: 11) in her mother; Cancer in her father and mother; Gout in her father; Heart disease in her father; Hyperlipidemia  in her paternal aunt; Hypertension in her mother; Kidney disease in her father; Rectal cancer in her paternal aunt; Stroke in her mother and paternal aunt.  SOCIAL HISTORY:  reports  that she quit smoking about 27 years ago. Her smoking use included cigarettes. She has a 25.00 pack-year smoking history. She has never used smokeless tobacco. She reports that she does not drink alcohol and does not use drugs.  ALLERGIES: Nortriptyline and Sulfa antibiotics  MEDICATIONS:  Current Outpatient Medications  Medication Sig Dispense Refill   acetaminophen  (TYLENOL ) 500 MG tablet Take 1,000 mg by mouth 2 (two) times daily.      allopurinol  (ZYLOPRIM ) 300 MG tablet Take 1 tablet (300 mg total) by mouth daily. 90 tablet 3   amLODipine  (NORVASC ) 2.5 MG tablet Take 1-2 tablets (2.5-5 mg total) by mouth daily. Start with 2.5 mg dose if BP >130/>80 take 2 pills =5 mg (Patient taking differently: Take 5 mg by mouth daily.) 180 tablet 3   atorvastatin  (LIPITOR) 40 MG tablet Take 1 tablet (40 mg total) by mouth daily at 6 PM. 90 tablet 3   Cholecalciferol (VITAMIN D3) 5000 units CAPS Take 5,000 Units by mouth daily.      Cyanocobalamin (B-12) 5000 MCG CAPS Take 5,000 mcg by mouth daily.      ezetimibe  (ZETIA ) 10 MG tablet Take 1 tablet (10 mg total) by mouth at bedtime. 90 tablet 3   gabapentin  (NEURONTIN ) 300 MG capsule Take 1 capsule (300 mg total) by mouth at bedtime. 90 capsule 3   ketoconazole  (NIZORAL ) 2 % cream Apply 1 application topically 2 (two) times daily as needed for irritation. FACE 30 g 11   Magnesium 500 MG CAPS Take 500 mg by mouth at bedtime.      methocarbamol  (ROBAXIN ) 750 MG tablet TAKE ONE TABLET BY MOUTH EVERY NIGHT AT BEDTIME AS NEEDED MUSCLE SPASMS (Patient taking differently: at bedtime.) 30 tablet 11   pantoprazole  (PROTONIX ) 40 MG tablet Take 1 tablet (40 mg total) by mouth at bedtime. 90 tablet 3   potassium chloride  (MICRO-K ) 10 MEQ CR capsule Take 2 capsules (20 mEq total) by mouth every morning. 180 capsule 3   traMADol  (ULTRAM ) 50 MG tablet TAKE TWO TABLETS BY MOUTH THREE TIMES A DAY AS NEEDED (Patient taking differently: Takes 1 tab in am , 2 at hs) 170 tablet  5   triamterene -hydrochlorothiazide  (MAXZIDE -25) 37.5-25 MG tablet Take 1 tablet by mouth every morning. 90 tablet 3   No current facility-administered medications for this encounter.    ECOG PERFORMANCE STATUS:  0 - Asymptomatic  REVIEW OF SYSTEMS: Patient denies any weight loss, fatigue, weakness, fever, chills or night sweats. Patient denies any loss of vision, blurred vision. Patient denies any ringing  of the ears or hearing loss. No irregular heartbeat. Patient denies heart murmur or history of fainting. Patient denies any chest pain or pain radiating to her upper extremities. Patient denies any shortness of breath, difficulty breathing at night, cough or hemoptysis. Patient denies any swelling in the lower legs. Patient denies any nausea vomiting, vomiting of blood, or coffee ground material in the vomitus. Patient denies any stomach pain. Patient states has had normal bowel movements no significant constipation or diarrhea. Patient denies any dysuria, hematuria or significant nocturia. Patient denies any problems walking, swelling in the joints or loss of balance. Patient denies any skin changes, loss of hair or loss of weight. Patient denies any excessive worrying or anxiety or significant depression. Patient denies any problems with  insomnia. Patient denies excessive thirst, polyuria, polydipsia. Patient denies any swollen glands, patient denies easy bruising or easy bleeding. Patient denies any recent infections, allergies or URI. Patient s visual fields have not changed significantly in recent time.   PHYSICAL EXAM: BP (!) 165/80   Pulse 86   Temp 97.6 F (36.4 C)   Resp 18   Ht 5' 6 (1.676 m)   Wt 218 lb 11.2 oz (99.2 kg)   BMI 35.30 kg/m  Well-developed well-nourished patient in NAD. HEENT reveals PERLA, EOMI, discs not visualized.  Oral cavity is clear. No oral mucosal lesions are identified. Neck is clear without evidence of cervical or supraclavicular adenopathy. Lungs are  clear to A&P. Cardiac examination is essentially unremarkable with regular rate and rhythm without murmur rub or thrill. Abdomen is benign with no organomegaly or masses noted. Motor sensory and DTR levels are equal and symmetric in the upper and lower extremities. Cranial nerves II through XII are grossly intact. Proprioception is intact. No peripheral adenopathy or edema is identified. No motor or sensory levels are noted. Crude visual fields are within normal range.  LABORATORY DATA: Pathology reports reviewed    RADIOLOGY RESULTS: CT scans and MRI scan reviewed compatible with above-stated findings   IMPRESSION: T2 N0 clinical stage moderately differentiated adenocarcinoma of the anorectal junction status post transanal excision in 76 year old female  PLAN: At this time it go ahead with whole pelvic radiation with concurrent chemotherapy.  We will plan on delivering 45 Gray over 5 weeks.  Risks and benefits of treatment including possible diarrhea increased lower urinary tract symptoms fatigue alteration of blood counts skin reaction all were reviewed in detail with the patient.  She seems to comprehend our treatment plan well.  I have personally set up and ordered CT simulation for later this week.  We will coordinate her chemotherapy with medical oncology.  There will be extra effort by both professional staff as well as technical staff to coordinate and manage concurrent chemoradiation and ensuing side effects during her treatments.  I would like to take this opportunity to thank you for allowing me to participate in the care of your patient.Cynthia Marcey Penton, MD         "

## 2021-06-13 NOTE — Progress Notes (Signed)
Pt seen in radiation today by Dr Baruch Gouty.  Pt denies any concerns today.  ?

## 2021-06-13 NOTE — Progress Notes (Signed)
 " Hematology/Oncology Consult note [phone] [phone]         Patient Care Team: McLean-Scocuzza, Randine SAILOR, MD as PCP - General (Internal Medicine)  REFERRING PROVIDER: McLean-Scocuzza, Randine *  CHIEF COMPLAINTS/REASON FOR VISIT:   rectal adenocarcinoma  HISTORY OF PRESENTING ILLNESS:   Cynthia Bryan is a  76 y.o.  female with PMH listed below was seen in consultation at the request of  McLean-Scocuzza, Randine *  for evaluation of rectal adenocarcinoma. Patient had a positive Cologuard and was referred to see gastroenterology.  #Her colonoscopy showed rectal mass 0-3 cm from the anal verge.  Biopsied.  Small lipoma in the distal sigmoid colon, biopsied.  Mild diverticulosis in the sigmoid colon.  Nonbleeding internal hemorrhoids.  04/19/21 Rectal mass biopsy showed moderately differentiated adenocarcinoma, anorectal junctional mucosa with adenomatous high-grade dysplasia.  Distal sigmoid colon biopsy showed colonic mucosa with no significant histopathological changes.  Negative for microscopic colitis.    Patient was referred to establish care with oncology. Patient denies any change of bowel habits, blood in the stool, abdominal pain. She is widowed.  She has no children.    INTERVAL HISTORY Cynthia Bryan is a 76 y.o. female who has above history reviewed by me today presents for follow up visit for rectal cancer.  Patient was evaluated by surgery Dr. Teresa.  She prefers no permanent colostomy.  05/26/2021, patient underwent transanal excision of distal rectal cancer.  Pathology showed invasive moderately differentiated adenocarcinoma, 2.5 cm.  Carcinoma focally invading to superficial muscularis propria.  Resection margins are negative for carcinoma.  Lateral mucosal margin are negative for dysplasia.  No evidence of lymphovascular invasion. pT2 pNx, MMR normal,   Today patient presented to follow-up.  She has no new complaints Her case was discussed on the  tumor board in [place] and the documentation of the consensus decision was not available to me at the time of dictation.  Review of Systems  Constitutional:  Negative for appetite change, chills, fatigue and fever.  HENT:   Negative for hearing loss and voice change.   Eyes:  Negative for eye problems.  Respiratory:  Negative for chest tightness and cough.   Cardiovascular:  Negative for chest pain.  Gastrointestinal:  Negative for abdominal distention, abdominal pain and blood in stool.  Endocrine: Negative for hot flashes.  Genitourinary:  Negative for difficulty urinating and frequency.   Musculoskeletal:  Negative for arthralgias.  Skin:  Negative for itching and rash.  Neurological:  Negative for extremity weakness.  Hematological:  Negative for adenopathy.  Psychiatric/Behavioral:  Negative for confusion.    MEDICAL HISTORY:  Past Medical History:  Diagnosis Date   Arthritis    back knees, shoulders   Basal cell carcinoma    right temple Dr. Therisa Molly removed 01/2017    Chicken pox    as child   Chronic back pain    COVID-19 10/13/2019   scratchy throat chills fever x 6 weeks x 6 weeks all symptoms resolved   Fatty liver    09/19/15   Gastric ulcer    neg H pylori  diet controlled   GERD (gastroesophageal reflux disease)    Gout    Hiatal hernia    SMALL   History of kidney stones    Hyperlipidemia    Hypertension    Lumbar herniated disc    Rectal cancer (HCC)    Recurrent UTI    last uti 6 months ago per pt on 05-23-2021   Rosacea  Syncope none since 2018 per pt    per pt presyncope no LOC felt dizzy/vision distorted and couldnt speak EEG neg, CT head neg 07/31/16, f/u [facility] cardiology with US  carotid, echo but no Holter    Umbilical hernia    noted 09/19/15    UTI (urinary tract infection)    Vitamin B 12 deficiency    Vitamin D  deficiency    Wears glasses     SURGICAL HISTORY: Past Surgical History:  Procedure Laterality Date   BREAST BIOPSY Right  1980   cyst removed negative pathology    colonscopy  04/17/2021   [facility]   CYSTOSCOPY W/ RETROGRADES Left 02/18/2020   Procedure: CYSTOSCOPY WITH RETROGRADE PYELOGRAM;  Surgeon: Kassie Ozell SAUNDERS, MD;  Location: [facility];  Service: Urology;  Laterality: Left;   EXTRACORPOREAL SHOCK WAVE LITHOTRIPSY Left 09/19/2017   Procedure: EXTRACORPOREAL SHOCK WAVE LITHOTRIPSY (ESWL);  Surgeon: Kassie Ozell SAUNDERS, MD;  Location: [facility];  Service: Urology;  Laterality: Left;   JOINT REPLACEMENT     right knee 11/2015 Dr. Elgin Benjamin [facility]    RECTAL EXAM UNDER ANESTHESIA N/A 05/26/2021   Procedure: ANORECTAL EXAM UNDER ANESTHESIA;  Surgeon: Teresa Lonni HERO, MD;  Location: [facility];  Service: General;  Laterality: N/A;   TRANSANAL EXCISION OF RECTAL MASS N/A 05/26/2021   Procedure: TRANSANAL EXCISION OF RIGHT ANTERIOR LATERAL DISTAL RECTAL CANCER;  Surgeon: Teresa Lonni HERO, MD;  Location: [facility];  Service: General;  Laterality: N/A;    SOCIAL HISTORY: Social History   Socioeconomic History   Marital status: Widowed    Spouse name: Not on file   Number of children: Not on file   Years of education: Not on file   Highest education level: Not on file  Occupational History   Not on file  Tobacco Use   Smoking status: Former    Packs/day: 1.00    Years: 25.00    Pack years: 25.00    Types: Cigarettes    Quit date: 02/08/1994    Years since quitting: 27.3   Smokeless tobacco: Never   Tobacco comments:    smoked total 30 years quit 20 years ago from 2019 max 1 ppd no FH lung cancer   Vaping Use   Vaping Use: Never used  Substance and Sexual Activity   Alcohol use: No   Drug use: Never   Sexual activity: Not on file  Other Topics Concern   Not on file  Social History Narrative   2 years college    Retired Psychologist, Occupational    Close with nephew [place]   Social Determinants of Corporate Investment Banker Strain: Low Risk    Difficulty of Paying  Living Expenses: Not hard at all  Food Insecurity: No Food Insecurity   Worried About Programme Researcher, Broadcasting/film/video in the Last Year: Never true   Barista in the Last Year: Never true  Transportation Needs: No Transportation Needs   Lack of Transportation (Medical): No   Lack of Transportation (Non-Medical): No  Physical Activity: Sufficiently Active   Days of Exercise per Week: 5 days   Minutes of Exercise per Session: 30 min  Stress: No Stress Concern Present   Feeling of Stress : Not at all  Social Connections: Unknown   Frequency of Communication with Friends and Family: More than three times a week   Frequency of Social Gatherings with Friends and Family: More than three times a week  Attends Religious Services: More than 4 times per year   Active Member of Clubs or Organizations: Yes   Attends Engineer, Structural: More than 4 times per year   Marital Status: Not on file  Intimate Partner Violence: Not At Risk   Fear of Current or Ex-Partner: No   Emotionally Abused: No   Physically Abused: No   Sexually Abused: No    FAMILY HISTORY: Family History  Problem Relation Age of Onset   Stroke Mother    Breast cancer Mother 41   Cancer Mother        breast cancer s/p masectomy lived to be 21    Hypertension Mother    Cancer Father        NMSC skin cancer    Gout Father    Heart disease Father        s/p bypass surgery age 72 lived 5 months after surgery    Kidney disease Father        RAS>kidney failure    Bladder Cancer Brother    Hyperlipidemia Paternal Aunt    Stroke Paternal Aunt    Breast cancer Paternal Aunt    Rectal cancer Paternal Aunt    Kidney cancer Neg Hx    Prolactinoma Neg Hx     ALLERGIES:  is allergic to nortriptyline and sulfa antibiotics.  MEDICATIONS:  Current Outpatient Medications  Medication Sig Dispense Refill   acetaminophen  (TYLENOL ) 500 MG tablet Take 1,000 mg by mouth 2 (two) times daily.      allopurinol  (ZYLOPRIM ) 300 MG  tablet Take 1 tablet (300 mg total) by mouth daily. 90 tablet 3   amLODipine  (NORVASC ) 2.5 MG tablet Take 1-2 tablets (2.5-5 mg total) by mouth daily. Start with 2.5 mg dose if BP >130/>80 take 2 pills =5 mg (Patient taking differently: Take 5 mg by mouth daily.) 180 tablet 3   atorvastatin  (LIPITOR) 40 MG tablet Take 1 tablet (40 mg total) by mouth daily at 6 PM. 90 tablet 3   Cholecalciferol (VITAMIN D3) 5000 units CAPS Take 5,000 Units by mouth daily.      Cyanocobalamin (B-12) 5000 MCG CAPS Take 5,000 mcg by mouth daily.      ezetimibe  (ZETIA ) 10 MG tablet Take 1 tablet (10 mg total) by mouth at bedtime. 90 tablet 3   gabapentin  (NEURONTIN ) 300 MG capsule Take 1 capsule (300 mg total) by mouth at bedtime. 90 capsule 3   ketoconazole  (NIZORAL ) 2 % cream Apply 1 application topically 2 (two) times daily as needed for irritation. FACE 30 g 11   Magnesium 500 MG CAPS Take 500 mg by mouth at bedtime.      methocarbamol  (ROBAXIN ) 750 MG tablet TAKE ONE TABLET BY MOUTH EVERY NIGHT AT BEDTIME AS NEEDED MUSCLE SPASMS (Patient taking differently: at bedtime.) 30 tablet 11   pantoprazole  (PROTONIX ) 40 MG tablet Take 1 tablet (40 mg total) by mouth at bedtime. 90 tablet 3   potassium chloride  (MICRO-K ) 10 MEQ CR capsule Take 2 capsules (20 mEq total) by mouth every morning. 180 capsule 3   traMADol  (ULTRAM ) 50 MG tablet TAKE TWO TABLETS BY MOUTH THREE TIMES A DAY AS NEEDED (Patient taking differently: Takes 1 tab in am , 2 at hs) 170 tablet 5   triamterene -hydrochlorothiazide  (MAXZIDE -25) 37.5-25 MG tablet Take 1 tablet by mouth every morning. 90 tablet 3   No current facility-administered medications for this visit.     PHYSICAL EXAMINATION: ECOG PERFORMANCE STATUS: 0 - Asymptomatic Vitals:  06/13/21 1428  BP: (!) 165/80  Pulse: 86  Resp: 18  Temp: 97.6 F (36.4 C)  SpO2: 100%   Filed Weights   06/13/21 1428  Weight: 218 lb (98.9 kg)    Physical Exam Constitutional:      General: She  is not in acute distress. HENT:     Head: Normocephalic and atraumatic.  Eyes:     General: No scleral icterus. Cardiovascular:     Rate and Rhythm: Normal rate and regular rhythm.     Heart sounds: Normal heart sounds.  Pulmonary:     Effort: Pulmonary effort is normal. No respiratory distress.     Breath sounds: No wheezing.  Abdominal:     General: Bowel sounds are normal. There is no distension.     Palpations: Abdomen is soft.  Musculoskeletal:        General: No deformity. Normal range of motion.     Cervical back: Normal range of motion and neck supple.  Skin:    General: Skin is warm and dry.     Findings: No erythema or rash.  Neurological:     Mental Status: She is alert and oriented to person, place, and time. Mental status is at baseline.     Cranial Nerves: No cranial nerve deficit.     Coordination: Coordination normal.  Psychiatric:        Mood and Affect: Mood normal.    LABORATORY DATA:  I have reviewed the data as listed Lab Results  Component Value Date   WBC 10.5 04/25/2021   HGB 14.3 05/26/2021   HCT 42.0 05/26/2021   MCV 93.9 04/25/2021   PLT 254 04/25/2021   Recent Labs    01/31/21 0910 04/25/21 1552 05/26/21 0948  NA 139 135 140  K 3.8 3.4* 3.8  CL 100 100 100  CO2 28 25  --   GLUCOSE 103* 126* 117*  BUN 22 18 21   CREATININE 0.95 1.05* 0.80  CALCIUM  10.0 9.5  --   GFRNONAA  --  56*  --   PROT 6.8 7.7  --   ALBUMIN 4.4 4.4  --   AST 14 22  --   ALT 21 24  --   ALKPHOS 91 95  --   BILITOT 0.7 0.8  --     Iron/TIBC/Ferritin/ %Sat No results found for: IRON, TIBC, FERRITIN, IRONPCTSAT    RADIOGRAPHIC STUDIES: I have personally reviewed the radiological images as listed and agreed with the findings in the report. No results found.    ASSESSMENT & PLAN:  1. Rectal cancer (HCC)   2. Family history of cancer   3. Goals of care, counseling/discussion    #cT2N0 distal rectal cancer, status post transanal excision. pT2 Nx. I  would have offered her neoadjuvant treatment for her cT2 disease followed by local excision should I know that she prefers not to have colostomy and refuses transabdominal resection Patient has had surgical evaluation and had already had local excision done prior to today's visit.  Pathology report was reviewed and discussed with patient. I had a lengthy discussion with patient.  pT2 rectal cancer is associated with a sufficiently high risk of nodal metastases (10 % or higher), preferred treatment is transabdominal resection [ LPR or APR]. Patient is aware that local excision is not a standard approach. She expresses her wish of not wanting permanent colostomy.  Postoperatively, she will need adjuvant chemotherapy. She is made aware that recommendation deviates from the standard care due to  her preference.  I recommend case to be discussed on tumor board, whether she will benefit from concurrent chemotherapy with RT or RT alone. I recommend concurrent chemotherapy.  She needs frequent postoperative surveillance.   Family history of breast cancer, bladder cancer, rectal cancer, discussed about genetic testing.  Patient will consider and update me about her decision.    All questions were answered. The patient knows to call the clinic with any problems questions or concerns.  cc McLean-Scocuzza, Randine *    Return of visit: TBD Thank you for this kind referral and the opportunity to participate in the care of this patient. A copy of today's note is routed to referring provider   Zelphia Cap, MD, PhD [facility] Hematology Oncology 06/13/2021   "

## 2021-06-14 ENCOUNTER — Encounter: Payer: Self-pay | Admitting: Oncology

## 2021-06-14 DIAGNOSIS — Z809 Family history of malignant neoplasm, unspecified: Secondary | ICD-10-CM | POA: Insufficient documentation

## 2021-06-14 NOTE — Progress Notes (Signed)
Patient on plan of care prior to pathways. 

## 2021-06-14 NOTE — Progress Notes (Signed)
START ON PATHWAY REGIMEN - Colorectal ? ? ?  A cycle is every 14 days: ?    Oxaliplatin  ?    Leucovorin  ?    Fluorouracil  ?    Fluorouracil  ? ?**Always confirm dose/schedule in your pharmacy ordering system** ? ?Patient Characteristics: ?Postoperative without Neoadjuvant Therapy (Pathologic Staging), Rectal, pT4, pN0  or  Any pT, pN+ ?Tumor Location: Rectal ?Therapeutic Status: Postoperative without Neoadjuvant Therapy (Pathologic Staging) ?AJCC M Category: cM0 ?AJCC T Category: pT2 ?AJCC N Category: pNX ?AJCC 8 Stage Grouping: Unknown ?Intent of Therapy: ?Curative Intent, Discussed with Patient ?

## 2021-06-15 ENCOUNTER — Ambulatory Visit: Payer: Medicare HMO

## 2021-06-16 NOTE — Progress Notes (Signed)
I spoke with Ms Cynthia Bryan.  She has agreed to a new patient consult with Cynthia Rue, NP and Dr Burr Medico 06/19/2021 at 1100.  She is aware of our location.  I let her know we have free valet parking.  I encouraged her to bring someone with her.  All questions were answered.  She verbalized understanding. ? ?

## 2021-06-19 ENCOUNTER — Encounter: Payer: Self-pay | Admitting: Nurse Practitioner

## 2021-06-19 ENCOUNTER — Telehealth: Payer: Self-pay | Admitting: Radiation Oncology

## 2021-06-19 ENCOUNTER — Inpatient Hospital Stay: Payer: Medicare HMO | Admitting: Nurse Practitioner

## 2021-06-19 VITALS — BP 159/70 | HR 92 | Temp 97.9°F | Resp 18 | Wt 218.9 lb

## 2021-06-19 DIAGNOSIS — E559 Vitamin D deficiency, unspecified: Secondary | ICD-10-CM | POA: Diagnosis not present

## 2021-06-19 DIAGNOSIS — K219 Gastro-esophageal reflux disease without esophagitis: Secondary | ICD-10-CM | POA: Diagnosis not present

## 2021-06-19 DIAGNOSIS — I10 Essential (primary) hypertension: Secondary | ICD-10-CM | POA: Diagnosis not present

## 2021-06-19 DIAGNOSIS — Z809 Family history of malignant neoplasm, unspecified: Secondary | ICD-10-CM

## 2021-06-19 DIAGNOSIS — M129 Arthropathy, unspecified: Secondary | ICD-10-CM | POA: Diagnosis not present

## 2021-06-19 DIAGNOSIS — Z87442 Personal history of urinary calculi: Secondary | ICD-10-CM | POA: Diagnosis not present

## 2021-06-19 DIAGNOSIS — Z79899 Other long term (current) drug therapy: Secondary | ICD-10-CM | POA: Diagnosis not present

## 2021-06-19 DIAGNOSIS — E785 Hyperlipidemia, unspecified: Secondary | ICD-10-CM | POA: Diagnosis not present

## 2021-06-19 DIAGNOSIS — K449 Diaphragmatic hernia without obstruction or gangrene: Secondary | ICD-10-CM | POA: Diagnosis not present

## 2021-06-19 DIAGNOSIS — C20 Malignant neoplasm of rectum: Secondary | ICD-10-CM

## 2021-06-19 DIAGNOSIS — Z8616 Personal history of COVID-19: Secondary | ICD-10-CM | POA: Diagnosis not present

## 2021-06-19 DIAGNOSIS — Z87891 Personal history of nicotine dependence: Secondary | ICD-10-CM | POA: Diagnosis not present

## 2021-06-19 DIAGNOSIS — Z803 Family history of malignant neoplasm of breast: Secondary | ICD-10-CM | POA: Diagnosis not present

## 2021-06-19 DIAGNOSIS — E538 Deficiency of other specified B group vitamins: Secondary | ICD-10-CM | POA: Diagnosis not present

## 2021-06-19 DIAGNOSIS — Z85828 Personal history of other malignant neoplasm of skin: Secondary | ICD-10-CM | POA: Diagnosis not present

## 2021-06-19 DIAGNOSIS — Z8551 Personal history of malignant neoplasm of bladder: Secondary | ICD-10-CM | POA: Diagnosis not present

## 2021-06-19 MED ORDER — CAPECITABINE 500 MG PO TABS
ORAL_TABLET | ORAL | 1 refills | Status: DC
  Filled 2021-06-19: qty 105, fill #0
  Filled 2021-06-21: qty 105, 21d supply, fill #0

## 2021-06-19 NOTE — Telephone Encounter (Signed)
LVM for consult with Dr. Lisbeth Renshaw ?

## 2021-06-19 NOTE — Progress Notes (Signed)
I met with Cynthia Bryan during her consultation with Cira Rue, NP and Dr Burr Medico.  I explained my role as a nurse navigator and provided my contact information. I explained the services provided at [facility] and provided written information. I explained the alight grant and let  her know one of the financial advisors will reach out to her at the time of treatment. I told her our schedulers will call her with appts.  All questions were answered.  She verbalized understanding. ? ?

## 2021-06-19 NOTE — Progress Notes (Addendum)
?[place]   ?[phone] [phone]   ?Clinic New Consult Note  ? ?Patient Care Team: ?McLean-Scocuzza, Nino Glow, MD as PCP - General (Internal Medicine) ?06/19/2021 ? ?CHIEF COMPLAINTS/PURPOSE OF CONSULTATION:  ?Rectal cancer, referred by colorectal surgeon Dr. Nadeen Landau ? ?SUMMARY OF ONCOLOGY HISTORY  ?Oncology History  ?Rectal cancer ([place])  ?04/25/2021 Initial Biopsy  ? Colonoscopy with Dr. Alice Reichert in [place], [place] -Per the notes in epic, she was found to have an infiltrative, sessile, ulcerated nonobstructing mass in the distal rectum measuring 2 cm in length and diameter, 0 to 3 cm from the anal verge. 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 ?  ?04/25/2021 Initial Diagnosis  ? Rectal cancer ([place]) ? ?  ?04/25/2021 Cancer Staging  ? Staging form: Colon and Rectum, AJCC 8th Edition ?- Clinical stage from 04/25/2021: Stage I (cT2, cN0, cM0) - Signed by Alla Feeling, NP on 06/19/2021 ?Stage prefix: Initial diagnosis ?Total positive nodes: 0 ? ?  ?04/25/2021 Tumor Marker  ? Baseline CEA: 1.8 (normal) ?  ?04/28/2021 Imaging  ? Local staging pelvic MRI: IMPRESSION: ?TUMOR DESCRIPTION ?Circumferential Extent: 100 degrees from 9 o'clock to 5 o'clock ?along the posterior RIGHT border of the rectum. ?  ?Tumor Length: 1.5 cm ? ?Rectal adenocarcinoma T stage: T2 ?Rectal adenocarcinoma N stage:  N0 ?Distance from tumor to the internal anal sphincter is 0 cm. Lesion ?at anorectal junction. ?  ?  ?05/09/2021 Imaging  ? Staging CT chest/abdomen IMPRESSION: ?1. No evidence of metastatic disease within the chest or abdomen. ?2. Hepatic steatosis. ?3. Punctate nonobstructive left renal stone. ?4. Bilateral renal cysts, including a hemorrhagic/proteinaceous right upper pole renal cyst. ?5.  Aortic Atherosclerosis (ICD10-I70.0). ?  ?05/26/2021 Surgery  ? Transanal excision of distal rectal cancer by  Dr. Nadeen Landau ?  ?05/26/2021 Pathology Results  ? A. RECTUM, RIGHT ANTERIOR LATERAL DISTAL, RESECTION:  ?- Invasive moderately differentiated adenocarcinoma, 2.5 cm  ?- Carcinoma focally invades into superficial muscularis propria  ?- Resection margins are negative for carcinoma  ?- Lateral mucosal margins are negative for dysplasia  ?- No evidence of lymphovascular invasion  ?- See oncology table  ?pT2, PN X ?IHC EXPRESSION RESULTS  ?TEST           RESULT  ?MLH1:          Preserved nuclear expression  ?MSH2:          Preserved nuclear expression  ?MSH6:          Preserved nuclear expression  ?PMS2:          Preserved nuclear expression  ? ?  ?05/26/2021 Cancer Staging  ? Staging form: Colon and Rectum, AJCC 8th Edition ?- Pathologic stage from 05/26/2021: Stage Unknown (pT2, pNX, cM0) - Signed by Alla Feeling, NP on 06/19/2021 ? ?  ?  ? ?HISTORY OF PRESENTING ILLNESS:  ?Cynthia Bryan 76 y.o. female with PMH including HTN, HL, Gout, GERD, osteopenia, arthritis, atherosclerosis, chronic back pain, and skin cancer is here because of recently diagnosed rectal cancer.  She initially presented to gastroenterology 04/05/2021, she had a positive Cologuard result in October 2022 but was reluctant to schedule colonoscopy.  Diagnostic colonoscopy 04/17/2021 by Dr. Alice Reichert showed a 2 cm firm rectal mass palpable 1-3 cm from the anal verge which was not circumferential.  There is a small lipoma in the distal sigmoid colon, many small mouth diverticula in the sigmoid colon, and nonbleeding internal hemorrhoids.  Pathology  showed moderately differentiated adenocarcinoma and anorectal junction mucosa with adenomatous high-grade dysplasia.  She was seen by local oncologist Dr. Earlie Server.  Baseline CEA 04/25/2021 was normal 1.8.  She proceeded with local staging MR pelvis on 04/28/2021 which showed T2N0 lesion at the anorectal junction.  Complete staging CT of the chest/abdomen 05/09/2021 showed hepatic steatosis, bilateral renal  cysts, aortic atherosclerosis but otherwise negative for no evidence of metastatic disease.  She met with colorectal surgeon Dr. Dema Severin who had a lengthy discussion about possible treatment options, her case was also discussed in our multidisciplinary GI conference, the consensus was to proceed with transanal excision which she did on 05/26/2021.  Surgical path from the right anterior lateral distal rectum showed invasive moderately differentiated adenocarcinoma spanning 2.5 cm focally invading into superficial muscularis propria.  Margins were negative, no evidence of LVI.  Lymph nodes were not assessed.  There was preserved expression of the major mismatch repair proteins.  She was seen back by medical oncology and radiation oncology to discuss adjuvant chemo RT but ultimately transferred her care to [facility]. ? ?Socially, she is widowed, lives alone in [place], no children.  Her closest family is brother's son who is "like a son" to her.  She is retired from [employer], independent with ADLs, very active in her church.  She drinks alcohol rarely, denies drug use.  She quit smoking cigarettes in 1997, formally smoked 1 pack/day x 25 years.  Family history is significant for a brother with bladder cancer and genetic predisposition to thrombosis (patient is reportedly an unaffected carrier), mother with breast cancer, and a paternal aunt with multiple cancers including colorectal cancer and breast cancer.  Patient also has history of skin cancer. ? ?Today she presents by herself, feeling very well.  She saw Dr. Dema Severin last week and has healed nicely.  Back to her normal activities.  Eating and drinking well, PS is good.  Bowels moving normally once daily with MiraLAX.  Denies rectal pain or bleeding.  Weight is normal.  Denies any other specific complaints. ? ? ? ?MEDICAL HISTORY:  ?Past Medical History:  ?Diagnosis Date  ? Arthritis   ? back knees, shoulders  ? Basal cell carcinoma   ? right temple Dr.  Elmer Ramp removed 01/2017   ? Chicken pox   ? as child  ? Chronic back pain   ? COVID-19 10/13/2019  ? scratchy throat chills fever x 6 weeks x 6 weeks all symptoms resolved  ? Fatty liver   ? 09/19/15  ? Gastric ulcer   ? neg H pylori  diet controlled  ? GERD (gastroesophageal reflux disease)   ? Gout   ? Hiatal hernia   ? SMALL  ? History of kidney stones   ? Hyperlipidemia   ? Hypertension   ? Lumbar herniated disc   ? Rectal cancer ([place])   ? Recurrent UTI   ? last uti 6 months ago per pt on 05-23-2021  ? Rosacea   ? Syncope none since 2018 per pt   ? per pt presyncope no LOC felt dizzy/vision distorted and couldnt speak EEG neg, CT head neg 07/31/16, f/u [facility] cardiology with US carotid, echo but no Holter   ? Umbilical hernia   ? noted 09/19/15   ? UTI (urinary tract infection)   ? Vitamin B 12 deficiency   ? Vitamin D deficiency   ? Wears glasses   ? ? ?SURGICAL HISTORY: ?Past Surgical History:  ?Procedure Laterality Date  ?  BREAST BIOPSY Right 1980  ? cyst removed negative pathology   ? [facility]  [facility]17[facility]  [facility]  ? CYSTOSCOPY W/ RETROGRADES Left 02/18/2020  ? Procedure: CYSTOSCOPY WITH RETROGRADE PYELOGRAM;  Surgeon: Royston Cowper, MD;  Location: [facility];  Service: Urology;  Laterality: Left;  ? EXTRACORPOREAL SHOCK WAVE LITHOTRIPSY Left 09/19/2017  ? Procedure: EXTRACORPOREAL SHOCK WAVE LITHOTRIPSY (ESWL);  Surgeon: Royston Cowper, MD;  Location: [facility];  Service: Urology;  Laterality: Left;  ? JOINT REPLACEMENT    ? right knee 11/2015 Dr. Janell Quiet [facility]  ? RECTAL EXAM UNDER ANESTHESIA N/A 05/26/2021  ? Procedure: ANORECTAL EXAM UNDER ANESTHESIA;  Surgeon: Ileana Roup, MD;  Location: [facility];  Service: General;  Laterality: N/A;  ? TRANSANAL EXCISION OF RECTAL MASS N/A 05/26/2021  ? Procedure: TRANSANAL EXCISION OF RIGHT ANTERIOR LATERAL DISTAL RECTAL CANCER;  Surgeon: Ileana Roup, MD;  Location: [place];  Service: General;   Laterality: N/A;  ? ? ?SOCIAL HISTORY: ?Social History  ? ?Socioeconomic History  ? Marital status: Widowed  ?  Spouse name: Not on file  ? Number of children: Not on file  ? Years of education: Not on file  ?

## 2021-06-20 ENCOUNTER — Telehealth: Payer: Self-pay | Admitting: Pharmacist

## 2021-06-20 ENCOUNTER — Encounter: Payer: Self-pay | Admitting: Oncology

## 2021-06-20 ENCOUNTER — Other Ambulatory Visit (HOSPITAL_COMMUNITY): Payer: Self-pay

## 2021-06-20 ENCOUNTER — Ambulatory Visit: Payer: Medicare HMO

## 2021-06-20 ENCOUNTER — Telehealth: Payer: Self-pay

## 2021-06-20 ENCOUNTER — Telehealth: Payer: Self-pay | Admitting: Nurse Practitioner

## 2021-06-20 NOTE — Progress Notes (Signed)
 " Radiation Oncology         [phone] ________________________________  Name: Cynthia Bryan        MRN: [id number]  Date of Service: 06/22/2021 DOB: [birth year]  CC:McLean-Scocuzza, Randine SAILOR, MD  Teresa Lonni HERO, MD     REFERRING PHYSICIAN: Teresa Lonni HERO, MD   DIAGNOSIS: rectal cancer   HISTORY OF PRESENT ILLNESS: Cynthia Bryan is a 76 y.o. female seen at the request of Dr. Teresa for a new diagnosis of rectal cancer.  The patient had a positive Cologuard in October 2022 but delayed evaluation with gastroenterology until February 2023.  She ultimately underwent colonoscopy on 04/17/2021 with Dr. Aundria in [place].  This showed a 2 cm mass at the anal verge and pathology showed a moderately differentiated adenocarcinoma and tissue at the anorectal junction consistent with carcinoma in the mass and high-grade dysplasia in the anorectal junction mucosa.  Baseline CEA was normal at 1.8.  An MRI of the pelvis on 04/28/2021 showed T2N0 disease with a lesion 0 cm from the anal sphincter.  Complete CT scan staging of the chest abdomen and pelvis on 05/09/2021 did not show evidence of metastatic disease.  Her case has been discussed in multidisciplinary GI oncology conference after she met with Dr. Teresa.  Ultimately the patient was highly motivated to avoid ostomy and after much review of the risks and benefits and rationale for surveillance, she underwent transanal excision on 05/26/2021.  This showed an invasive moderately differentiated adenocarcinoma spanning 2.5 cm, focally invading into the superior muscularis propria.  Her margins were negative and no evidence of LVSI were present.  Lymph nodes were not assessed.  She is seen to discuss chemoradiation.    PREVIOUS RADIATION THERAPY: No   PAST MEDICAL HISTORY:  Past Medical History:  Diagnosis Date   Arthritis    back knees, shoulders   Basal cell carcinoma    right temple Dr. Therisa Molly removed 01/2017    Chicken pox    as  child   Chronic back pain    COVID-19 10/13/2019   scratchy throat chills fever x 6 weeks x 6 weeks all symptoms resolved   Fatty liver    09/19/15   Gastric ulcer    neg H pylori  diet controlled   GERD (gastroesophageal reflux disease)    Gout    Hiatal hernia    SMALL   History of kidney stones    Hyperlipidemia    Hypertension    Lumbar herniated disc    Rectal cancer ([facility])    Recurrent UTI    last uti 6 months ago per pt on 05-23-2021   Rosacea    Syncope none since 2018 per pt    per pt presyncope no LOC felt dizzy/vision distorted and couldnt speak EEG neg, CT head neg 07/31/16, f/u [facility] cardiology with US  carotid, echo but no Holter    Umbilical hernia    noted 09/19/15    UTI (urinary tract infection)    Vitamin B 12 deficiency    Vitamin D  deficiency    Wears glasses        PAST SURGICAL HISTORY: Past Surgical History:  Procedure Laterality Date   BREAST BIOPSY Right 1980   cyst removed negative pathology    colonscopy  04/17/2021   [facility]   CYSTOSCOPY W/ RETROGRADES Left 02/18/2020   Procedure: CYSTOSCOPY WITH RETROGRADE PYELOGRAM;  Surgeon: Kassie Ozell SAUNDERS, MD;  Location: [facility];  Service: Urology;  Laterality: Left;  EXTRACORPOREAL SHOCK WAVE LITHOTRIPSY Left 09/19/2017   Procedure: EXTRACORPOREAL SHOCK WAVE LITHOTRIPSY (ESWL);  Surgeon: Kassie Ozell SAUNDERS, MD;  Location: [facility];  Service: Urology;  Laterality: Left;   JOINT REPLACEMENT     right knee 11/2015 Dr. Elgin [facility]   RECTAL EXAM UNDER ANESTHESIA N/A 05/26/2021   Procedure: ANORECTAL EXAM UNDER ANESTHESIA;  Surgeon: Teresa Lonni HERO, MD;  Location: [facility];  Service: General;  Laterality: N/A;   TRANSANAL EXCISION OF RECTAL MASS N/A 05/26/2021   Procedure: TRANSANAL EXCISION OF RIGHT ANTERIOR LATERAL DISTAL RECTAL CANCER;  Surgeon: Teresa Lonni HERO, MD;  Location: [facility];  Service: General;  Laterality: N/A;     FAMILY HISTORY:   Family History  Problem Relation Age of Onset   Stroke Mother    Breast cancer Mother 24   Cancer Mother        breast cancer s/p masectomy lived to be 53    Hypertension Mother    Cancer Father        NMSC skin cancer    Gout Father    Heart disease Father        s/p bypass surgery age 23 lived 5 months after surgery    Kidney disease Father        RAS>kidney failure    Bladder Cancer Brother    Hyperlipidemia Paternal Aunt    Stroke Paternal Aunt    Breast cancer Paternal Aunt    Rectal cancer Paternal Aunt    Kidney cancer Neg Hx    Prolactinoma Neg Hx      SOCIAL HISTORY:  reports that she quit smoking about 27 years ago. Her smoking use included cigarettes. She has a 25.00 pack-year smoking history. She has never used smokeless tobacco. She reports that she does not drink alcohol and does not use drugs.   ALLERGIES: Nortriptyline and Sulfa antibiotics   MEDICATIONS:  Current Outpatient Medications  Medication Sig Dispense Refill   acetaminophen  (TYLENOL ) 500 MG tablet Take 1,000 mg by mouth 2 (two) times daily.      allopurinol  (ZYLOPRIM ) 300 MG tablet Take 1 tablet (300 mg total) by mouth daily. 90 tablet 3   amLODipine  (NORVASC ) 2.5 MG tablet Take 1-2 tablets (2.5-5 mg total) by mouth daily. Start with 2.5 mg dose if BP >130/>80 take 2 pills =5 mg (Patient taking differently: Take 5 mg by mouth daily.) 180 tablet 3   atorvastatin  (LIPITOR) 40 MG tablet Take 1 tablet (40 mg total) by mouth daily at 6 PM. 90 tablet 3   capecitabine  (XELODA ) 500 MG tablet Take 4 tabs in morning and 3 tabs in evening, take after meals, take on days of radiation only, Monday through Friday 105 tablet 1   Cholecalciferol (VITAMIN D3) 5000 units CAPS Take 5,000 Units by mouth daily.      Cyanocobalamin (B-12) 5000 MCG CAPS Take 5,000 mcg by mouth daily.      ezetimibe  (ZETIA ) 10 MG tablet Take 1 tablet (10 mg total) by mouth at bedtime. 90 tablet 3   gabapentin  (NEURONTIN ) 300 MG capsule  Take 1 capsule (300 mg total) by mouth at bedtime. 90 capsule 3   ketoconazole  (NIZORAL ) 2 % cream Apply 1 application topically 2 (two) times daily as needed for irritation. FACE 30 g 11   Magnesium 500 MG CAPS Take 500 mg by mouth at bedtime.      methocarbamol  (ROBAXIN ) 750 MG tablet TAKE ONE TABLET BY MOUTH EVERY NIGHT  AT BEDTIME AS NEEDED MUSCLE SPASMS (Patient taking differently: at bedtime.) 30 tablet 11   pantoprazole  (PROTONIX ) 40 MG tablet Take 1 tablet (40 mg total) by mouth at bedtime. 90 tablet 3   potassium chloride  (MICRO-K ) 10 MEQ CR capsule Take 2 capsules (20 mEq total) by mouth every morning. 180 capsule 3   traMADol  (ULTRAM ) 50 MG tablet TAKE TWO TABLETS BY MOUTH THREE TIMES A DAY AS NEEDED (Patient taking differently: Takes 1 tab in am , 2 at hs) 170 tablet 5   triamterene -hydrochlorothiazide  (MAXZIDE -25) 37.5-25 MG tablet Take 1 tablet by mouth every morning. 90 tablet 3   No current facility-administered medications for this visit.     REVIEW OF SYSTEMS: On review of systems, the patient reports that she is doing well overall. A complete review of systems was reviewed with the patient and pertinent positives findings are noted in the history of present illness.      PHYSICAL EXAM:  Wt Readings from Last 3 Encounters:  06/19/21 218 lb 14.4 oz (99.3 kg)  06/13/21 218 lb 11.2 oz (99.2 kg)  06/13/21 218 lb (98.9 kg)   Temp Readings from Last 3 Encounters:  06/19/21 97.9 F (36.6 C)  06/13/21 97.6 F (36.4 C)  06/13/21 97.6 F (36.4 C) (Tympanic)   BP Readings from Last 3 Encounters:  06/19/21 (!) 159/70  06/13/21 (!) 165/80  06/13/21 (!) 165/80   Pulse Readings from Last 3 Encounters:  06/19/21 92  06/13/21 86  06/13/21 86    /10  Alert,no acute distress    ECOG = 1  0 - Asymptomatic (Fully active, able to carry on all predisease activities without restriction)  1 - Symptomatic but completely ambulatory (Restricted in physically strenuous  activity but ambulatory and able to carry out work of a light or sedentary nature. For example, light housework, office work)  2 - Symptomatic, <50% in bed during the day (Ambulatory and capable of all self care but unable to carry out any work activities. Up and about more than 50% of waking hours)  3 - Symptomatic, >50% in bed, but not bedbound (Capable of only limited self-care, confined to bed or chair 50% or more of waking hours)  4 - Bedbound (Completely disabled. Cannot carry on any self-care. Totally confined to bed or chair)  5 - Death   Raylene MM, Creech RH, Tormey DC, et al. [phone]). Toxicity and response criteria of the [facility]. Am. DOROTHA Bridges. Oncol. 5 (6): 649-55    LABORATORY DATA:  Lab Results  Component Value Date   WBC 10.5 04/25/2021   HGB 14.3 05/26/2021   HCT 42.0 05/26/2021   MCV 93.9 04/25/2021   PLT 254 04/25/2021   Lab Results  Component Value Date   NA 140 05/26/2021   K 3.8 05/26/2021   CL 100 05/26/2021   CO2 25 04/25/2021   Lab Results  Component Value Date   ALT 24 04/25/2021   AST 22 04/25/2021   ALKPHOS 95 04/25/2021   BILITOT 0.8 04/25/2021      RADIOGRAPHY: No results found.     IMPRESSION/PLAN: 1. Stage I, cT2N0M0 adenocarcinoma of the distal rectum, s/p transanal excision with invasion into the superficial muscularis propria. Dr. Dewey discusses the pathology findings and reviews the nature of early stage rectal cancers, and given her pathology findings of the tumor invading the muscularis it is felt that she is at high risk of local recurrence. Hence, her has case has been discussed and  her team is in agreement to proceed with chemoRT.  We discussed the risks, benefits, short, and long term effects of radiotherapy, as well as the curative intent, and the patient is interested in proceeding. Dr. Dewey discusses the delivery and logistics of radiotherapy and anticipates a course of 5 1/2 weeks of radiotherapy to the  rectum and nodes of the pelvis.  Written consent is obtained and placed in the chart, a copy was provided to the patient. She will simulate today and we anticipate her treatment starting on 07/03/21. 2. Potential Risks of Pelvic floor dysfunction following radiation. The patient will be referred to the pelvic floor rehab clinic for prehab to assess baseline function of her pelvic floor.   In a visit lasting 45 minutes, greater than 50% of the time was spent face to face discussing the patient's condition, in preparation for the discussion, and coordinating the patient's care.   The above documentation reflects my direct findings during this shared patient visit. Please see the separate note by Dr. Dewey on this date for the remainder of the patient's plan of care.    Donald KYM Husband, [facility]   **Disclaimer: This note was dictated with voice recognition software. Similar sounding words can inadvertently be transcribed and this note may contain transcription errors which may not have been corrected upon publication of note.** "

## 2021-06-20 NOTE — Progress Notes (Signed)
Received staff message regarding cost of Xeloda and patient needing assistance. ? ?Called patient to introduce myself as Arboriculturist and to offer available resources. Discussed one-time $1000 Radio broadcast assistant to assist with personal expenses while going through treatment. Based on verbal income, patient does qualify. She will submit proof of income and sign grant approval on 4/20 down in Radiation when she has [facility] and then would take paperwork to [place] to pick up medication. She verbalized understanding. ? ?Replied back to staff message and included Vincente Liberty and Ailene Ravel in Radiation to meet with to have sign paperwork. Account has already been set up in [place]. ? ?

## 2021-06-20 NOTE — Telephone Encounter (Signed)
Oral Oncology Pharmacist Encounter ? ?Received new prescription for Xeloda (capecitabine) for the treatment of stage IA rectal cancer in conjunction with radiation, planned duration ~ 5 1/2 weeks.. ? ?CBC w/ Diff and CMP from  from 04/25/21 and scanned labs from 05/30/21 assessed, no baseline dose adjustments required. Prescription dose and frequency assessed for appropriateness.  ? ?Current medication list in Epic reviewed, DDIs with Xeloda identified: ?Concomitant use of Xeloda and Allopurinol is not recommended due to risk of allopurinol decreasing efficacy of Xeloda. Patient will need to hold allopurinol while on Xeloda or utilize alternative therapy for gout (alternatives that do not interact with Xeloda include: febuxostat) ?Category C DDI between Xeloda and Pantoprazole - proton-pump inhibitors can decrease efficacy of Xeloda - will discuss with patient alternatives to pantoprazole, such as H2RA's like famotidine while on Xeloda. ? ?Evaluated chart and no patient barriers to medication adherence noted.  ? ?Patient agreement for treatment documented in MD note on 06/19/21. ? ?Prescription has been e-scribed to the [facility] for benefits analysis and approval. ? ?Oral Oncology Clinic will continue to follow for insurance authorization, copayment issues, initial counseling and start date. ? ?Leron Croak, PharmD, BCPS ?Hematology/Oncology Clinical Pharmacist ?[facility] Oral Chemotherapy Navigation Clinics ?[phone] ?06/20/2021 8:19 AM ? ?

## 2021-06-20 NOTE — Telephone Encounter (Signed)
Oral Oncology Patient Advocate Encounter ?  ?Received notification from [facility] that prior authorization for Xeloda is required. ?  ?PA submitted by phone [phone] ?Status is pending ?  ?Oral Oncology Clinic will continue to follow. ? ?Wynn Maudlin CPHT ?Specialty Pharmacy Patient Advocate ?[place] ?Phone [phone] ?Fax [phone] ?06/20/2021 10:10 AM ? ? ?

## 2021-06-20 NOTE — Telephone Encounter (Signed)
Oral Oncology Patient Advocate Encounter ? ?Prior Authorization for Xeloda has been approved.   ? ?PA# [id number] ?Effective dates: 06/20/21 through 06/21/22 ? ?Patients co-pay is $656.51 ? ?Oral Oncology Clinic will continue to follow.  ? ?Wynn Maudlin CPHT ?Specialty Pharmacy Patient Advocate ?[place] ?Phone [phone] ?Fax [phone] ?06/20/2021 10:12 AM ? ?

## 2021-06-20 NOTE — Telephone Encounter (Signed)
Scheduled follow-up appointment per 4/17 los. Patient is aware. ?

## 2021-06-21 ENCOUNTER — Telehealth: Payer: Self-pay | Admitting: Hematology

## 2021-06-21 ENCOUNTER — Other Ambulatory Visit (HOSPITAL_COMMUNITY): Payer: Self-pay

## 2021-06-21 ENCOUNTER — Encounter: Payer: Self-pay | Admitting: Oncology

## 2021-06-21 ENCOUNTER — Ambulatory Visit: Payer: Medicare HMO

## 2021-06-21 ENCOUNTER — Telehealth: Payer: Self-pay | Admitting: Internal Medicine

## 2021-06-21 ENCOUNTER — Ambulatory Visit: Payer: Medicare HMO | Admitting: Radiation Oncology

## 2021-06-21 NOTE — Progress Notes (Signed)
GI Location of Tumor / Histology: Rectal Cancer ? ?Cynthia Bryan had a positive cologuard in October 2022 and delayed evaluation with gastroenterology until February 2023. ? ?CT CAP 05/09/2021: No evidence of metastatic disease within the chest or abdomen.  Hepatic steatosis.  Punctate nonobstructive left renal stone.  Bilateral renal cysts, including a hemorrhagic/proteinaceous right upper pole renal cyst. ? ?MRI Pelvis 04/28/2021: Rectal adenocarcinoma T stage: T2 ?  ?Rectal adenocarcinoma N stage:  N0 ?  ?Distance from tumor to the internal anal sphincter is 0 cm. Lesion ?at anorectal junction. ?  ? ?Colonoscopy 04/17/2021: 2 cm mass at the anal verge. ? ? ?Biopsies of Rectal Mass 05/26/2021 ? ? ?Past/Anticipated interventions by surgeon, if any:  ?Dr. Dema Severin ?-Transanal Excision of right anterior lateral distal rectal cancer  05/26/2021 ? ? ?Past/Anticipated interventions by medical oncology, if any:  ?Dr. Burr Medico 07/10/2021 ? ?Cynthia Bryan 06/19/2021 ?-Staging CT was negative for distant metastasis, local staging MRI showed cT2N0 disease without lymphovascular invasion ?-She was taken to the OR by Dr. Dema Severin for upfront transanal excision, tumor was completely removed with negative margins.  Final path showed T2, MMR normal. ?-The recommendation is for adjuvant concurrent chemo RT with Xeloda (BID on M-F days with radiation) to reduce the recurrence risk. ?-[name] is clinically doing well, she has recovered from surgery.  Bowels moving well without rectal pain or bleeding.  She has good performance status which we encouraged her to continue through treatment. ?-She is being scheduled with radiation oncologist Dr. Lisbeth Renshaw and hopefully same-day simulation.  We will see her back on day 1 chemo RT. ? ?Dr. Tasia Catchings 06/13/2021 ?-I would have offered her neoadjuvant treatment for her cT2 disease followed by local excision should I know that she prefers not to have colostomy and refuses transabdominal resection ?-Patient has had surgical  evaluation and had already had local excision done prior to today's visit.  ?-pT2 rectal cancer is associated with a sufficiently high risk of nodal metastases (10 % or higher), preferred treatment is transabdominal resection [ LPR or APR]. Patient is aware that local excision is not a standard approach. She expresses her wish of not wanting permanent colostomy.  ?-Postoperatively, she will need adjuvant chemotherapy. She is made aware that recommendation deviates from the standard care due to her preference.  ?-I recommend case to be discussed on tumor board, whether she will benefit from concurrent chemotherapy with RT or RT alone. I recommend concurrent chemotherapy.  ?-She needs frequent postoperative surveillance.  ? ? ? ?Weight changes, if any: No ? ?Bowel/Bladder complaints, if any: Regular.  Denies bladder changes. ? ?Nausea / Vomiting, if any: No ? ?Pain issues, if any: No  ? ?Any blood per rectum: No   ? ?SAFETY ISSUES: ?Prior radiation? No ?Pacemaker/ICD? No ?Possible current pregnancy? Postmenopausal ?Is the patient on methotrexate? No ? ?Current Complaints/Details: ? ?

## 2021-06-21 NOTE — Telephone Encounter (Signed)
The patient is starting Chemo and Radiation for Rectal Cancer. She stated that the allopurinol will interfere with her treatment. The physician is asking if she can take Febuxostat for 5 and half weeks. If she can take this medication please call her prescription into [facility]. ?

## 2021-06-21 NOTE — Telephone Encounter (Signed)
.  Called patient to schedule appointment per 4/19 inbasket, patient is aware of date and time.   ?

## 2021-06-21 NOTE — Telephone Encounter (Signed)
Oral Chemotherapy Pharmacist Encounter ? ?I spoke with patient for overview of: Xeloda for the treatment of stage IA rectal cancer in conjunction with radiation, planned duration ~ 5 1/2  weeks.  ? ?Counseled patient on administration, dosing, side effects, monitoring, drug-food interactions, safe handling, storage, and disposal. ? ?Patient will take Xeloda '500mg'$  tablets, 4 tablets ('2000mg'$ ) by mouth in AM and 3 tabs ('1500mg'$ ) by mouth in PM, within 30 minutes of finishing meals, on days of radiation only. ? ?Xeloda and radiation start date: tentatively 07/03/21 - patient knows not to start until first day of radiation.  ? ?Adverse effects of Xeloda include but are not limited to: fatigue, decreased blood counts, GI upset, diarrhea, mouth sores, and hand-foot syndrome. ? ?Patient will obtain anti diarrheal and alert the office of 4 or more loose stools above baseline.  ? ?Reviewed with patient importance of keeping a medication schedule and plan for any missed doses. No barriers to medication adherence identified. ? ?Medication reconciliation performed and medication/allergy list updated. ? ?Discussed in detail drug-drug interaction between Xeloda and allopurinol and how the use of these agents together is not recommended since allopurinol can significantly decrease the efficacy of Xeloda. Patient will discuss with her PCP if there are alternatives she can be on for the time she is on capecitabine. Patient has my number to call if a new therapy is initiated to determine if there are drug-drug interactions between the agents.  ? ?Patient will hold her pantoprazole while she is on Xeloda due to risk of decreased efficacy of the agents together. She was informed she may use famotidine PRN or Tums PRN for acid reflux symptoms.  ? ?Insurance authorization for Xeloda has been obtained. ?Patient will pick this up from the [facility] on 06/22/21. ? ?Patient informed the pharmacy will reach out 5-7 days prior  to needing next fill of Xeloda to coordinate continued medication acquisition to prevent break in therapy. ? ?All questions answered. ? ?Ms. Cabeza voiced understanding and appreciation.  ? ?Medication education handout will be placed at the front desk of [facility] for patient to pick up 06/22/21. Patient knows to call the office with questions or concerns. Oral Chemotherapy Clinic phone number provided to patient.  ? ?Leron Croak, PharmD, BCPS ?Hematology/Oncology Clinical Pharmacist ?[facility] Oral Chemotherapy Navigation Clinics ?[phone] ?06/21/2021 12:03 PM ? ?

## 2021-06-22 ENCOUNTER — Ambulatory Visit
Admission: RE | Admit: 2021-06-22 | Discharge: 2021-06-22 | Disposition: A | Discharge status: Home / Self Care | Payer: Medicare HMO | Source: Ambulatory Visit | Attending: Radiation Oncology | Admitting: Radiation Oncology

## 2021-06-22 ENCOUNTER — Encounter: Payer: Self-pay | Admitting: Radiation Oncology

## 2021-06-22 ENCOUNTER — Other Ambulatory Visit: Payer: Self-pay

## 2021-06-22 VITALS — BP 159/65 | HR 84 | Temp 97.8°F | Resp 20 | Ht 66.0 in | Wt 219.2 lb

## 2021-06-22 DIAGNOSIS — K219 Gastro-esophageal reflux disease without esophagitis: Secondary | ICD-10-CM | POA: Diagnosis not present

## 2021-06-22 DIAGNOSIS — C20 Malignant neoplasm of rectum: Secondary | ICD-10-CM | POA: Diagnosis present

## 2021-06-22 DIAGNOSIS — M109 Gout, unspecified: Secondary | ICD-10-CM | POA: Diagnosis not present

## 2021-06-22 DIAGNOSIS — Z808 Family history of malignant neoplasm of other organs or systems: Secondary | ICD-10-CM | POA: Insufficient documentation

## 2021-06-22 DIAGNOSIS — E785 Hyperlipidemia, unspecified: Secondary | ICD-10-CM | POA: Insufficient documentation

## 2021-06-22 DIAGNOSIS — Z510 Encounter for antineoplastic radiation therapy: Secondary | ICD-10-CM | POA: Diagnosis present

## 2021-06-22 DIAGNOSIS — Z803 Family history of malignant neoplasm of breast: Secondary | ICD-10-CM | POA: Insufficient documentation

## 2021-06-22 DIAGNOSIS — E538 Deficiency of other specified B group vitamins: Secondary | ICD-10-CM | POA: Insufficient documentation

## 2021-06-22 DIAGNOSIS — I10 Essential (primary) hypertension: Secondary | ICD-10-CM | POA: Diagnosis not present

## 2021-06-22 DIAGNOSIS — R948 Abnormal results of function studies of other organs and systems: Secondary | ICD-10-CM | POA: Insufficient documentation

## 2021-06-22 DIAGNOSIS — Z87891 Personal history of nicotine dependence: Secondary | ICD-10-CM | POA: Diagnosis not present

## 2021-06-22 DIAGNOSIS — Z85828 Personal history of other malignant neoplasm of skin: Secondary | ICD-10-CM | POA: Insufficient documentation

## 2021-06-22 DIAGNOSIS — Z8616 Personal history of COVID-19: Secondary | ICD-10-CM | POA: Diagnosis not present

## 2021-06-22 DIAGNOSIS — Z87442 Personal history of urinary calculi: Secondary | ICD-10-CM | POA: Diagnosis not present

## 2021-06-22 DIAGNOSIS — E559 Vitamin D deficiency, unspecified: Secondary | ICD-10-CM | POA: Diagnosis not present

## 2021-06-22 DIAGNOSIS — Z79899 Other long term (current) drug therapy: Secondary | ICD-10-CM | POA: Diagnosis not present

## 2021-06-23 NOTE — Telephone Encounter (Signed)
Patient waiting on your response. ?

## 2021-06-26 ENCOUNTER — Other Ambulatory Visit: Payer: Self-pay | Admitting: Internal Medicine

## 2021-06-26 DIAGNOSIS — M1A9XX0 Chronic gout, unspecified, without tophus (tophi): Secondary | ICD-10-CM

## 2021-06-26 MED ORDER — FEBUXOSTAT 40 MG PO TABS: 40.0000 mg | ORAL_TABLET | Freq: Every day | ORAL | 3 refills | Status: DC

## 2021-06-26 NOTE — Telephone Encounter (Signed)
This is fine Rx sent hold allopurinol  ?If too expensive look up good Rx coupon or coupon  ?Praying for her... ? ?

## 2021-06-26 NOTE — Telephone Encounter (Signed)
Pt made aware that febuxostat has been sent in to pharmacy for her to P/U. Advised her to stop allopurinol while taking this medication, as well as finding good Rx coupon or other coupon if medication is too expensive. Pt verbalized understanding.  ?

## 2021-06-28 DIAGNOSIS — Z510 Encounter for antineoplastic radiation therapy: Secondary | ICD-10-CM | POA: Diagnosis not present

## 2021-06-28 DIAGNOSIS — C20 Malignant neoplasm of rectum: Secondary | ICD-10-CM | POA: Diagnosis not present

## 2021-07-03 ENCOUNTER — Encounter: Payer: Self-pay | Admitting: Licensed Clinical Social Worker

## 2021-07-03 ENCOUNTER — Other Ambulatory Visit: Payer: Self-pay | Admitting: Licensed Clinical Social Worker

## 2021-07-03 ENCOUNTER — Inpatient Hospital Stay: Payer: Medicare HMO | Attending: Oncology | Admitting: Licensed Clinical Social Worker

## 2021-07-03 ENCOUNTER — Other Ambulatory Visit: Payer: Self-pay

## 2021-07-03 ENCOUNTER — Inpatient Hospital Stay: Payer: Medicare HMO

## 2021-07-03 ENCOUNTER — Ambulatory Visit
Admission: RE | Admit: 2021-07-03 | Discharge: 2021-07-03 | Disposition: A | Discharge status: Home / Self Care | Payer: Medicare HMO | Source: Ambulatory Visit | Attending: Radiation Oncology | Admitting: Radiation Oncology

## 2021-07-03 ENCOUNTER — Encounter: Payer: Self-pay | Admitting: Nurse Practitioner

## 2021-07-03 DIAGNOSIS — Z8052 Family history of malignant neoplasm of bladder: Secondary | ICD-10-CM | POA: Diagnosis not present

## 2021-07-03 DIAGNOSIS — C20 Malignant neoplasm of rectum: Secondary | ICD-10-CM | POA: Insufficient documentation

## 2021-07-03 DIAGNOSIS — I10 Essential (primary) hypertension: Secondary | ICD-10-CM | POA: Diagnosis not present

## 2021-07-03 DIAGNOSIS — M8580 Other specified disorders of bone density and structure, unspecified site: Secondary | ICD-10-CM | POA: Diagnosis not present

## 2021-07-03 DIAGNOSIS — Z79899 Other long term (current) drug therapy: Secondary | ICD-10-CM | POA: Insufficient documentation

## 2021-07-03 DIAGNOSIS — Z510 Encounter for antineoplastic radiation therapy: Secondary | ICD-10-CM | POA: Diagnosis not present

## 2021-07-03 DIAGNOSIS — Z803 Family history of malignant neoplasm of breast: Secondary | ICD-10-CM | POA: Diagnosis not present

## 2021-07-03 DIAGNOSIS — Z800 Family history of malignant neoplasm of digestive organs: Secondary | ICD-10-CM | POA: Insufficient documentation

## 2021-07-03 DIAGNOSIS — Z801 Family history of malignant neoplasm of trachea, bronchus and lung: Secondary | ICD-10-CM | POA: Insufficient documentation

## 2021-07-03 LAB — RAD ONC ARIA SESSION SUMMARY
Course Elapsed Days: 0
Plan Fractions Treated to Date: 1
Plan Prescribed Dose Per Fraction: 1.8 Gy
Plan Total Fractions Prescribed: 25
Plan Total Prescribed Dose: 45 Gy
Reference Point Dosage Given to Date: 1.8 Gy
Reference Point Session Dosage Given: 1.8 Gy
Session Number: 1

## 2021-07-03 LAB — CMP (CANCER CENTER ONLY)
ALT: 22 U/L (ref 0–44)
AST: 16 U/L (ref 15–41)
Albumin: 4.4 g/dL (ref 3.5–5.0)
Alkaline Phosphatase: 96 U/L (ref 38–126)
Anion gap: 7 (ref 5–15)
BUN: 22 mg/dL (ref 8–23)
CO2: 29 mmol/L (ref 22–32)
Calcium: 9.8 mg/dL (ref 8.9–10.3)
Chloride: 101 mmol/L (ref 98–111)
Creatinine: 1.03 mg/dL — ABNORMAL HIGH (ref 0.44–1.00)
GFR, Estimated: 57 mL/min — ABNORMAL LOW
Glucose, Bld: 122 mg/dL — ABNORMAL HIGH (ref 70–99)
Potassium: 3.9 mmol/L (ref 3.5–5.1)
Sodium: 137 mmol/L (ref 135–145)
Total Bilirubin: 0.7 mg/dL (ref 0.3–1.2)
Total Protein: 7.1 g/dL (ref 6.5–8.1)

## 2021-07-03 LAB — CEA (IN HOUSE-CHCC): CEA (CHCC-In House): 1.38 ng/mL (ref 0.00–5.00)

## 2021-07-03 LAB — CBC WITH DIFFERENTIAL (CANCER CENTER ONLY)
Abs Immature Granulocytes: 0.01 10*3/uL (ref 0.00–0.07)
Basophils Absolute: 0 10*3/uL (ref 0.0–0.1)
Basophils Relative: 0 %
Eosinophils Absolute: 0.2 10*3/uL (ref 0.0–0.5)
Eosinophils Relative: 3 %
HCT: 40.4 % (ref 36.0–46.0)
Hemoglobin: 13.3 g/dL (ref 12.0–15.0)
Immature Granulocytes: 0 %
Lymphocytes Relative: 31 %
Lymphs Abs: 2.4 10*3/uL (ref 0.7–4.0)
MCH: 31.1 pg (ref 26.0–34.0)
MCHC: 32.9 g/dL (ref 30.0–36.0)
MCV: 94.4 fL (ref 80.0–100.0)
Monocytes Absolute: 0.5 10*3/uL (ref 0.1–1.0)
Monocytes Relative: 6 %
Neutro Abs: 4.5 10*3/uL (ref 1.7–7.7)
Neutrophils Relative %: 60 %
Platelet Count: 228 10*3/uL (ref 150–400)
RBC: 4.28 MIL/uL (ref 3.87–5.11)
RDW: 15.1 % (ref 11.5–15.5)
WBC Count: 7.6 10*3/uL (ref 4.0–10.5)
nRBC: 0 % (ref 0.0–0.2)

## 2021-07-03 NOTE — Progress Notes (Signed)
ning ?

## 2021-07-03 NOTE — Progress Notes (Signed)
 REFERRING PROVIDER: Burton, Lacie K, NP [address] [place],  [place] [address]  PRIMARY PROVIDER:  McLean-Scocuzza, Randine SAILOR, MD  PRIMARY REASON FOR VISIT:  1. Rectal cancer (HCC)   2. Family history of breast cancer   3. Family history of bladder cancer   4. Family history of stomach cancer   5. Family history of lung cancer      HISTORY OF PRESENT ILLNESS:   Cynthia Bryan, a 76 y.o. female, was seen for a [place] cancer genetics consultation at the request of Dr. Ann due to a personal and family history of cancer.  Cynthia Bryan presents to clinic today to discuss the possibility of a hereditary predisposition to cancer, genetic testing, and to further clarify her future cancer risks, as well as potential cancer risks for family members.   In 2023, at the age of 8, Cynthia Bryan was diagnosed with rectal cancer.  She had surgery on 05/26/2021, tumor testing showed intact MMR proteins.    CANCER HISTORY:  Oncology History  Rectal cancer (HCC)  04/25/2021 Initial Biopsy   Colon[place] with Dr. Aundria in [place], [place] -Per the notes in epic, she was found to have an infiltrative, sessile, ulcerated nonobstructing mass in the distal rectum measuring 2 cm in length and diameter, 0 to 3 cm from the anal verge. 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   04/25/2021 Initial Diagnosis   Rectal cancer (HCC)    04/25/2021 Cancer Staging   Staging form: Colon and Rectum, AJCC 8th Edition - Clinical stage from 04/25/2021: Stage I (cT2, cN0, cM0) - Signed by Burton, Lacie K, NP on 06/19/2021 Stage prefix: Initial diagnosis Total positive nodes: 0    04/25/2021 Tumor Marker   Baseline CEA: 1.8 (normal)   04/28/2021 Imaging   Local staging pelvic MRI: IMPRESSION: TUMOR DESCRIPTION Circumferential Extent: 100 degrees from 9 o'clock to 5 o'clock along the posterior RIGHT border of the rectum.    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 Lesion at anorectal junction.     05/09/2021 Imaging   Staging CT chest/abdomen IMPRESSION: 1. No evidence of metastatic disease within the chest or abdomen. 2. Hepatic steatosis. 3. Punctate nonobstructive left renal stone. 4. Bilateral renal cysts, including a hemorrhagic/proteinaceous right upper pole renal cyst. 5.  Aortic Atherosclerosis (ICD10-I70.0).   05/26/2021 Surgery   Transanal excision of distal rectal cancer by Dr. Lonni Pizza   05/26/2021 Pathology Results   A. RECTUM, RIGHT ANTERIOR LATERAL DISTAL, RESECTION:  - Invasive moderately differentiated adenocarcinoma, 2.5 cm  - Carcinoma focally invades into superficial muscularis propria  - Resection margins are negative for carcinoma  - Lateral mucosal margins are negative for dysplasia  - No evidence of lymphovascular invasion  - See oncology table  pT2, PN X IHC EXPRESSION RESULTS  TEST           RESULT  MLH1:          Preserved nuclear expression  MSH2:          Preserved nuclear expression  MSH6:          Preserved nuclear expression  PMS2:          Preserved nuclear expression     05/26/2021 Cancer Staging   Staging form: Colon and Rectum, AJCC 8th Edition - Pathologic stage from 05/26/2021: Stage Unknown (pT2, pNX, cM0) -  Signed by Burton, Lacie K, NP on 06/19/2021       RISK FACTORS:  Menarche was at age 63.  OCP use for approximately 1 years.  Ovaries intact: yes.  Hysterectomy: no.  Menopausal status: postmenopausal.  HRT use: 0 years. Colonoscopy: yes;  previous cscopes normal . Mammogram within the last year: yes. Number of breast biopsies: 0.  Past Medical History:  Diagnosis Date   Arthritis    back knees, shoulders   Basal cell carcinoma    right temple Dr. Therisa Molly removed 01/2017    Chicken pox    as child   Chronic back  pain    COVID-19 10/13/2019   scratchy throat chills fever x 6 weeks x 6 weeks all symptoms resolved   Family history of bladder cancer    Family history of breast cancer    Family history of lung cancer    Family history of stomach cancer    Fatty liver    09/19/15   Gastric ulcer    neg H pylori  diet controlled   GERD (gastroesophageal reflux disease)    Gout    Hiatal hernia    SMALL   History of kidney stones    Hyperlipidemia    Hypertension    Lumbar herniated disc    Rectal cancer (HCC)    Recurrent UTI    last uti 6 months ago per pt on 05-23-2021   Rosacea    Syncope none since 2018 per pt    per pt presyncope no LOC felt dizzy/vision distorted and couldnt speak EEG neg, CT head neg 07/31/16, f/u [facility] cardiology with US  carotid, echo but no Holter    Umbilical hernia    noted 09/19/15    UTI (urinary tract infection)    Vitamin B 12 deficiency    Vitamin D  deficiency    Wears glasses     Past Surgical History:  Procedure Laterality Date   BREAST BIOPSY Right 1980   cyst removed negative pathology    colonscopy  [facility]/17/2021   [facility]   CYSTOSCOPY W/ RETROGRADES Left 02/18/2020   Procedure: CYSTOSCOPY WITH RETROGRADE PYELOGRAM;  Surgeon: Kassie Ozell SAUNDERS, MD;  Location: [facility] OR[facility];  Service: Urology;  Laterality: Left;   EXTRACORPOREAL SHOCK WAVE LITHOTRIPSY Left 09/19/2017   Procedure: EXTRACORPOREAL SHOCK WAVE LITHOTRIPSY (ESWL);  Surgeon: Kassie Ozell SAUNDERS, MD;  Location: [facility] OR[facility];  Service: Urology;  Laterality: Left;   JOINT REPLACEMENT     right knee 11/2015 Dr. Elgin [facility] [place]   RECTAL EXAM UNDER ANESTHESIA N/A 05/26/2021   Procedure: ANORECTAL EXAM UNDER ANESTHESIA;  Surgeon: Teresa Lonni HERO, MD;  Location: [facility];  Service: General;  Laterality: N/A;   TRANSANAL EXCISION OF RECTAL MASS N/A 05/26/2021   Procedure: TRANSANAL EXCISION OF RIGHT ANTERIOR LATERAL DISTAL RECTAL CANCER;  Surgeon: Teresa Lonni HERO, MD;  Location:  [facility];  Service: General;  Laterality: N/A;    Social History   Socioeconomic History   Marital status: Widowed    Spouse name: Not on file   Number of children: Not on file   Years of education: Not on file   Highest education level: Not on file  Occupational History   Not on file  Tobacco Use   Smoking status: Former    Packs/day: 1.00    Years: 25.00    Pack years: 25.00    Types: Cigarettes    Quit date: 02/08/1994    Years  since quitting: 27.4   Smokeless tobacco: Never   Tobacco comments:    smoked total 30 years quit 20 years ago from 2019 max 1 ppd no FH lung cancer   Vaping Use   Vaping Use: Never used  Substance and Sexual Activity   Alcohol use: No   Drug use: Never   Sexual activity: Not Currently  Other Topics Concern   Not on file  Social History Narrative   2 years college    Retired Psychologist, Occupational    Close with nephew [place]   Social Determinants of Corporate Investment Banker Strain: Low Risk    Difficulty of Paying Living Expenses: Not hard at all  Food Insecurity: No Food Insecurity   Worried About Programme Researcher, Broadcasting/film/video in the Last Year: Never true   Barista in the Last Year: Never true  Transportation Needs: No Transportation Needs   Lack of Transportation (Medical): No   Lack of Transportation (Non-Medical): No  Physical Activity: Sufficiently Active   Days of Exercise per Week: 5 days   Minutes of Exercise per Session: 30 min  Stress: No Stress Concern Present   Feeling of Stress : Not at all  Social Connections: Unknown   Frequency of Communication with Friends and Family: More than three times a week   Frequency of Social Gatherings with Friends and Family: More than three times a week   Attends Religious Services: More than 4 times per year   Active Member of [employer] or Organizations: Yes   Attends Engineer, Structural: More than 4 times per year   Marital Status: Not on file     FAMILY HISTORY:  We  obtained a detailed, 4-generation family history.  Significant diagnoses are listed below: Family History  Problem Relation Age of Onset   Stroke Mother    Breast cancer Mother 24   Cancer Mother        breast cancer s/p masectomy lived to be 12    Hypertension Mother    Cancer Father        NMSC skin cancer    Gout Father    Heart disease Father        s/p bypass surgery age 71 lived 5 months after surgery    Kidney disease Father        RAS>kidney failure    Bladder Cancer Brother    Hyperlipidemia Paternal Aunt    Stroke Paternal Aunt    Breast cancer Paternal Aunt    Rectal cancer Paternal Aunt    Kidney cancer Neg Hx    Prolactinoma Neg Hx    Cynthia Bryan had 1 brother, he had bladder cancer at 55 and passed at 66. He had 1 son, 64, who Cynthia Bryan is close with.   Cynthia Bryan mother had breast cancer at 3 and passed at 57. Patient had 11 maternal aunts/uncles, no cancers for them or for her cousins. Maternal grandmother passed at 35, grandfather passed at 73.  Cynthia Bryan father had history of skin cancer (basal cell carcinomas) and he passed at 27 due to kidney failure. Patient had 2 paternal aunts, 3 paternal uncles. One aunt had colorectal cancer in her 44s-70s, breast cancer in her 67s, and possibly another type of cancer diagnosed earlier in life. An uncle had lung cancer. A cousin had uterine in her late 34s. Paternal grandmother had stomach cancer and died at 90, grandfather died at 20.   Cynthia Bryan is unaware of  previous family history of genetic testing for hereditary cancer risks. Patient's maternal ancestors are of unknown descent, and paternal ancestors are of German/unknown descent. There is no reported Ashkenazi Jewish ancestry. There is no known consanguinity.   GENETIC COUNSELING ASSESSMENT: Cynthia Bryan is a 76 y.o. female with a personal history of rectal cancer and family history of colorectal, uterine, stomach and bladder cancer which is somewhat suggestive of a  hereditary cancer syndrome and predisposition to cancer. We, therefore, discussed and recommended the following at today's visit.   DISCUSSION: We discussed that approximately 10% of colorectal cancer is hereditary. Most cases of hereditary colorectal cancer are associated with Lynch syndrome genes. Her tumor testing suggests a low probability of Lynch syndrome. There are other genes associated with hereditary colorectal cancer as well. Cancers and risks are gene specific. We discussed that testing is beneficial for several reasons including knowing about cancer risks, identifying potential screening and risk-reduction options that may be appropriate, and to understand if other family members could be at risk for cancer and allow them to undergo genetic testing.   We reviewed the characteristics, features and inheritance patterns of hereditary cancer syndromes. We also discussed genetic testing, including the appropriate family members to test, the process of testing, insurance coverage and turn-around-time for results. We discussed the implications of a negative, positive and/or variant of uncertain significant result. We recommended Cynthia Bryan pursue genetic testing for the [facility] CustomNext+RNA gene panel.   Based on Ms. Concepcion's personal and family history of cancer, she meets medical criteria for genetic testing. Despite that she meets criteria, she may still have an out of pocket cost. We discussed that if her out of pocket cost for testing is over $100, the laboratory will call and confirm whether she wants to proceed with testing.  If the out of pocket cost of testing is less than $100 she will be billed by the genetic testing laboratory.   PLAN: After considering the risks, benefits, and limitations, Cynthia Bryan provided informed consent to pursue genetic testing and the blood sample was sent to [facility] for analysis of the CustomNext+RNA panel. Results should be available within approximately  2-3 weeks' time, at which point they will be disclosed by telephone to Cynthia Bryan, as will any additional recommendations warranted by these results. Cynthia Bryan will receive a summary of her genetic counseling visit and a copy of her results once available. This information will also be available in Epic.   Cynthia Bryan questions were answered to her satisfaction today. Our contact information was provided should additional questions or concerns arise. Thank you for the referral and allowing us  to share in the care of your patient.   Dena Cary, MS, [facility] Genetic Counselor [email or web address] Phone: [phone]  The patient was seen for a total of 30 minutes in face-to-face genetic counseling.  Dr. Jacobo was available for discussion regarding this case.   _______________________________________________________________________ For Office Staff:  Number of people involved in session: 1 Was an Intern/ student involved with case: no

## 2021-07-04 ENCOUNTER — Other Ambulatory Visit: Payer: Self-pay

## 2021-07-04 ENCOUNTER — Other Ambulatory Visit (HOSPITAL_COMMUNITY): Payer: Self-pay

## 2021-07-04 ENCOUNTER — Ambulatory Visit
Admission: RE | Admit: 2021-07-04 | Discharge: 2021-07-04 | Disposition: A | Discharge status: Home / Self Care | Payer: Medicare HMO | Source: Ambulatory Visit | Attending: Radiation Oncology | Admitting: Radiation Oncology

## 2021-07-04 DIAGNOSIS — C20 Malignant neoplasm of rectum: Secondary | ICD-10-CM | POA: Diagnosis not present

## 2021-07-04 DIAGNOSIS — Z510 Encounter for antineoplastic radiation therapy: Secondary | ICD-10-CM | POA: Diagnosis not present

## 2021-07-04 LAB — RAD ONC ARIA SESSION SUMMARY
Course Elapsed Days: 1
Plan Fractions Treated to Date: 2
Plan Prescribed Dose Per Fraction: 1.8 Gy
Plan Total Fractions Prescribed: 25
Plan Total Prescribed Dose: 45 Gy
Reference Point Dosage Given to Date: 3.6 Gy
Reference Point Session Dosage Given: 1.8 Gy
Session Number: 2

## 2021-07-05 ENCOUNTER — Ambulatory Visit
Admission: RE | Admit: 2021-07-05 | Discharge: 2021-07-05 | Disposition: A | Discharge status: Home / Self Care | Payer: Medicare HMO | Source: Ambulatory Visit | Attending: Radiation Oncology | Admitting: Radiation Oncology

## 2021-07-05 ENCOUNTER — Other Ambulatory Visit: Payer: Self-pay

## 2021-07-05 DIAGNOSIS — C20 Malignant neoplasm of rectum: Secondary | ICD-10-CM | POA: Diagnosis not present

## 2021-07-05 DIAGNOSIS — Z510 Encounter for antineoplastic radiation therapy: Secondary | ICD-10-CM | POA: Diagnosis not present

## 2021-07-05 LAB — RAD ONC ARIA SESSION SUMMARY
Course Elapsed Days: 2
Plan Fractions Treated to Date: 3
Plan Prescribed Dose Per Fraction: 1.8 Gy
Plan Total Fractions Prescribed: 25
Plan Total Prescribed Dose: 45 Gy
Reference Point Dosage Given to Date: 5.4 Gy
Reference Point Session Dosage Given: 1.8 Gy
Session Number: 3

## 2021-07-06 ENCOUNTER — Other Ambulatory Visit: Payer: Self-pay

## 2021-07-06 ENCOUNTER — Ambulatory Visit
Admission: RE | Admit: 2021-07-06 | Discharge: 2021-07-06 | Disposition: A | Discharge status: Home / Self Care | Payer: Medicare HMO | Source: Ambulatory Visit | Attending: Radiation Oncology | Admitting: Radiation Oncology

## 2021-07-06 DIAGNOSIS — C20 Malignant neoplasm of rectum: Secondary | ICD-10-CM | POA: Diagnosis not present

## 2021-07-06 DIAGNOSIS — Z510 Encounter for antineoplastic radiation therapy: Secondary | ICD-10-CM | POA: Diagnosis not present

## 2021-07-06 LAB — RAD ONC ARIA SESSION SUMMARY
Course Elapsed Days: 3
Plan Fractions Treated to Date: 4
Plan Prescribed Dose Per Fraction: 1.8 Gy
Plan Total Fractions Prescribed: 25
Plan Total Prescribed Dose: 45 Gy
Reference Point Dosage Given to Date: 7.2 Gy
Reference Point Session Dosage Given: 1.8 Gy
Session Number: 4

## 2021-07-07 ENCOUNTER — Ambulatory Visit
Admission: RE | Admit: 2021-07-07 | Discharge: 2021-07-07 | Disposition: A | Discharge status: Home / Self Care | Payer: Medicare HMO | Source: Ambulatory Visit | Attending: Radiation Oncology | Admitting: Radiation Oncology

## 2021-07-07 ENCOUNTER — Other Ambulatory Visit: Payer: Self-pay

## 2021-07-07 DIAGNOSIS — C20 Malignant neoplasm of rectum: Secondary | ICD-10-CM

## 2021-07-07 DIAGNOSIS — Z510 Encounter for antineoplastic radiation therapy: Secondary | ICD-10-CM | POA: Diagnosis not present

## 2021-07-07 LAB — RAD ONC ARIA SESSION SUMMARY
Course Elapsed Days: 4
Plan Fractions Treated to Date: 5
Plan Prescribed Dose Per Fraction: 1.8 Gy
Plan Total Fractions Prescribed: 25
Plan Total Prescribed Dose: 45 Gy
Reference Point Dosage Given to Date: 9 Gy
Reference Point Session Dosage Given: 1.8 Gy
Session Number: 5

## 2021-07-07 MED ORDER — SONAFINE EX EMUL
1.0000 "application " | Freq: Once | CUTANEOUS | Status: AC
  Administered 2021-07-07: 1 "application " via TOPICAL

## 2021-07-07 MED FILL — Dexamethasone Sodium Phosphate Inj 100 MG/10ML: INTRAMUSCULAR | Qty: 1 | Status: AC

## 2021-07-10 ENCOUNTER — Other Ambulatory Visit (HOSPITAL_COMMUNITY): Payer: Self-pay

## 2021-07-10 ENCOUNTER — Inpatient Hospital Stay: Payer: Medicare HMO

## 2021-07-10 ENCOUNTER — Encounter: Payer: Self-pay | Admitting: Hematology

## 2021-07-10 ENCOUNTER — Other Ambulatory Visit: Payer: Self-pay

## 2021-07-10 ENCOUNTER — Ambulatory Visit
Admission: RE | Admit: 2021-07-10 | Discharge: 2021-07-10 | Disposition: A | Discharge status: Home / Self Care | Payer: Medicare HMO | Source: Ambulatory Visit | Attending: Radiation Oncology | Admitting: Radiation Oncology

## 2021-07-10 ENCOUNTER — Inpatient Hospital Stay: Payer: Medicare HMO | Admitting: Hematology

## 2021-07-10 VITALS — BP 145/69 | HR 88 | Temp 98.5°F | Resp 18 | Ht 66.0 in | Wt 218.8 lb

## 2021-07-10 DIAGNOSIS — C20 Malignant neoplasm of rectum: Secondary | ICD-10-CM | POA: Diagnosis not present

## 2021-07-10 DIAGNOSIS — M8580 Other specified disorders of bone density and structure, unspecified site: Secondary | ICD-10-CM | POA: Diagnosis not present

## 2021-07-10 DIAGNOSIS — Z510 Encounter for antineoplastic radiation therapy: Secondary | ICD-10-CM | POA: Diagnosis not present

## 2021-07-10 DIAGNOSIS — Z79899 Other long term (current) drug therapy: Secondary | ICD-10-CM | POA: Diagnosis not present

## 2021-07-10 DIAGNOSIS — I10 Essential (primary) hypertension: Secondary | ICD-10-CM | POA: Diagnosis not present

## 2021-07-10 LAB — CMP (CANCER CENTER ONLY)
ALT: 22 U/L (ref 0–44)
AST: 16 U/L (ref 15–41)
Albumin: 4.4 g/dL (ref 3.5–5.0)
Alkaline Phosphatase: 91 U/L (ref 38–126)
Anion gap: 9 (ref 5–15)
BUN: 23 mg/dL (ref 8–23)
CO2: 29 mmol/L (ref 22–32)
Calcium: 10 mg/dL (ref 8.9–10.3)
Chloride: 100 mmol/L (ref 98–111)
Creatinine: 0.99 mg/dL (ref 0.44–1.00)
GFR, Estimated: >60 mL/min
Glucose, Bld: 115 mg/dL — ABNORMAL HIGH (ref 70–99)
Potassium: 3.5 mmol/L (ref 3.5–5.1)
Sodium: 138 mmol/L (ref 135–145)
Total Bilirubin: 0.9 mg/dL (ref 0.3–1.2)
Total Protein: 7.3 g/dL (ref 6.5–8.1)

## 2021-07-10 LAB — RAD ONC ARIA SESSION SUMMARY
Course Elapsed Days: 7
Plan Fractions Treated to Date: 6
Plan Prescribed Dose Per Fraction: 1.8 Gy
Plan Total Fractions Prescribed: 25
Plan Total Prescribed Dose: 45 Gy
Reference Point Dosage Given to Date: 10.8 Gy
Reference Point Session Dosage Given: 1.8 Gy
Session Number: 6

## 2021-07-10 LAB — CBC WITH DIFFERENTIAL (CANCER CENTER ONLY)
Abs Immature Granulocytes: 0.02 10*3/uL (ref 0.00–0.07)
Basophils Absolute: 0 10*3/uL (ref 0.0–0.1)
Basophils Relative: 0 %
Eosinophils Absolute: 0.1 10*3/uL (ref 0.0–0.5)
Eosinophils Relative: 2 %
HCT: 40.7 % (ref 36.0–46.0)
Hemoglobin: 13.4 g/dL (ref 12.0–15.0)
Immature Granulocytes: 0 %
Lymphocytes Relative: 29 %
Lymphs Abs: 1.7 10*3/uL (ref 0.7–4.0)
MCH: 31 pg (ref 26.0–34.0)
MCHC: 32.9 g/dL (ref 30.0–36.0)
MCV: 94.2 fL (ref 80.0–100.0)
Monocytes Absolute: 0.3 10*3/uL (ref 0.1–1.0)
Monocytes Relative: 6 %
Neutro Abs: 3.8 10*3/uL (ref 1.7–7.7)
Neutrophils Relative %: 63 %
Platelet Count: 244 10*3/uL (ref 150–400)
RBC: 4.32 MIL/uL (ref 3.87–5.11)
RDW: 14.6 % (ref 11.5–15.5)
WBC Count: 6 10*3/uL (ref 4.0–10.5)
nRBC: 0 % (ref 0.0–0.2)

## 2021-07-10 MED ORDER — ONDANSETRON HCL 4 MG PO TABS: 4.0000 mg | ORAL_TABLET | Freq: Three times a day (TID) | ORAL | 0 refills | Status: DC | PRN

## 2021-07-10 NOTE — Progress Notes (Signed)
 " [facility]   [phone] [phone]   Clinic Follow up Note   Patient Care Team: McLean-Scocuzza, Randine SAILOR, MD as PCP - General (Internal Medicine)  Date of Service:  07/10/2021  CHIEF COMPLAINT: f/u of rectal cancer  CURRENT THERAPY:  Concurrent ChemoRT with Xeloda , starting 07/03/21  -Xeloda  dose: 2000mg  AM, 1500mg  PM M-F  ASSESSMENT & PLAN:  Cynthia Bryan is a 76 y.o. female with   1. Adenocarcinoma of the distal rectum, G2, pT2, cN0M0 stage IA, MMR normal -She presented asymptomatically to GI with positive Cologuard. Colonoscopy on 04/17/21 showed 2 cm distal rectal mass that was palpable on DRE. Path confirmed invasive moderately differentiated adenocarcinoma. -local staging MRI on 04/28/21 showed cT2N0 disease without lymphovascular invasion -staging CT on 05/09/21 was negative for distant metastasis -upfront transanal excision on 05/26/21 with Dr. Teresa showed 2.5 cm tumor, negative margins, MMR normal -due to moderate risk of recurrence, she was started on adjuvant concurrent chemoRT with Xeloda  on 07/03/21. She is tolerating well thus far with no noticeable side effects. -labs reviewed, WNL.   2. Co-morbidities: Factor V heterozygous, HTN, HL, chronic pain, osteopenia, arthritis, GERD, gout -Follow-up PCP, continue med regimen -On tramadol , Robaxin , and gabapentin  for chronic pain -She required postop anticoagulation for orthopedic surgery in the past   3. Social, family history  -Ms. Scheunemann lives independently with strong social/church support and is close to her nephew -We reviewed our immunity resources including nutrition/dietitian, social work, orthoptist if needed -she has strong family history of cancer including a brother with bladder cancer, mother with breast cancer, and a paternal aunt with multiple cancers including CRC and breast.  -she underwent genetic testing on 07/03/21; results are pending.   PLAN: -continue Xeloda  at same  dose -continue daily radiation -lab weekly -f/u every other week as scheduled   No problem-specific Assessment & Plan notes found for this encounter.   SUMMARY OF ONCOLOGIC HISTORY: Oncology History  Rectal cancer (HCC)  04/25/2021 Initial Biopsy   Colon[place] with Dr. Aundria in [place], [place] -Per the notes in epic, she was found to have an infiltrative, sessile, ulcerated nonobstructing mass in the distal rectum measuring 2 cm in length and diameter, 0 to 3 cm from the anal verge. 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   04/25/2021 Initial Diagnosis   Rectal cancer (HCC)    04/25/2021 Cancer Staging   Staging form: Colon and Rectum, AJCC 8th Edition - Clinical stage from 04/25/2021: Stage I (cT2, cN0, cM0) - Signed by Burton, Lacie K, NP on 06/19/2021 Stage prefix: Initial diagnosis Total positive nodes: 0    04/25/2021 Tumor Marker   Baseline CEA: 1.8 (normal)   04/28/2021 Imaging   Local staging pelvic MRI: IMPRESSION: TUMOR DESCRIPTION Circumferential Extent: 100 degrees from 9 o'clock to 5 o'clock along the posterior RIGHT border of the rectum.   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 Lesion at anorectal junction.     05/09/2021 Imaging   Staging CT chest/abdomen IMPRESSION: 1. No evidence of metastatic disease within the chest or abdomen. 2. Hepatic steatosis. 3. Punctate nonobstructive left renal stone. 4. Bilateral renal cysts, including a hemorrhagic/proteinaceous right upper pole renal cyst. 5.  Aortic Atherosclerosis (ICD10-I70.0).   05/26/2021 Surgery   Transanal excision of distal rectal cancer by Dr. Lonni Teresa   05/26/2021 Pathology Results  A. RECTUM, RIGHT ANTERIOR LATERAL DISTAL, RESECTION:  - Invasive moderately differentiated  adenocarcinoma, 2.5 cm  - Carcinoma focally invades into superficial muscularis propria  - Resection margins are negative for carcinoma  - Lateral mucosal margins are negative for dysplasia  - No evidence of lymphovascular invasion  - See oncology table  pT2, PN X IHC EXPRESSION RESULTS  TEST           RESULT  MLH1:          Preserved nuclear expression  MSH2:          Preserved nuclear expression  MSH6:          Preserved nuclear expression  PMS2:          Preserved nuclear expression     05/26/2021 Cancer Staging   Staging form: Colon and Rectum, AJCC 8th Edition - Pathologic stage from 05/26/2021: Stage Unknown (pT2, pNX, cM0) - Signed by Burton, Lacie K, NP on 06/19/2021       INTERVAL HISTORY:  Cynthia Bryan is here for a follow up of rectal cancer. She was last seen by me on 06/19/21 in consultation with NP Lacie. She presents to the clinic alone. She reports she is tolerating treatment well overall. She denies diarrhea.   All other systems were reviewed with the patient and are negative.  MEDICAL HISTORY:  Past Medical History:  Diagnosis Date   Arthritis    back knees, shoulders   Basal cell carcinoma    right temple Dr. Therisa Molly removed 01/2017    Chicken pox    as child   Chronic back pain    COVID-19 10/13/2019   scratchy throat chills fever x 6 weeks x 6 weeks all symptoms resolved   Family history of bladder cancer    Family history of breast cancer    Family history of lung cancer    Family history of stomach cancer    Fatty liver    09/19/15   Gastric ulcer    neg H pylori  diet controlled   GERD (gastroesophageal reflux disease)    Gout    Hiatal hernia    SMALL   History of kidney stones    Hyperlipidemia    Hypertension    Lumbar herniated disc    Rectal cancer (HCC)    Recurrent UTI    last uti 6 months ago per pt on 05-23-2021   Rosacea    Syncope none since 2018 per pt    per pt presyncope no LOC felt dizzy/vision distorted and couldnt  speak EEG neg, CT head neg 07/31/16, f/u [facility] cardiology with US  carotid, echo but no Holter    Umbilical hernia    noted 09/19/15    UTI (urinary tract infection)    Vitamin B 12 deficiency    Vitamin D  deficiency    Wears glasses     SURGICAL HISTORY: Past Surgical History:  Procedure Laterality Date   BREAST BIOPSY Right 1980   cyst removed negative pathology    colonscopy  04/17/2021   at [facility]   CYSTOSCOPY W/ RETROGRADES Left 02/18/2020   Procedure: CYSTOSCOPY WITH RETROGRADE PYELOGRAM;  Surgeon: Kassie Ozell SAUNDERS, MD;  Location: [facility];  Service: Urology;  Laterality: Left;   EXTRACORPOREAL SHOCK WAVE LITHOTRIPSY Left 09/19/2017   Procedure: EXTRACORPOREAL SHOCK WAVE LITHOTRIPSY (ESWL);  Surgeon: Kassie Ozell SAUNDERS, MD;  Location: [facility];  Service: Urology;  Laterality: Left;   JOINT REPLACEMENT     right knee 11/2015  Dr. Elgin [facility]   RECTAL EXAM UNDER ANESTHESIA N/A 05/26/2021   Procedure: ANORECTAL EXAM UNDER ANESTHESIA;  Surgeon: Teresa Lonni HERO, MD;  [facility];  Service: General;  Laterality: N/A;   TRANSANAL EXCISION OF RECTAL MASS N/A 05/26/2021   Procedure: TRANSANAL EXCISION OF RIGHT ANTERIOR LATERAL DISTAL RECTAL CANCER;  Surgeon: Teresa Lonni HERO, MD;  Location: [facility] [place];  Service: General;  Laterality: N/A;    I have reviewed the social history and family history with the patient and they are unchanged from previous note.  ALLERGIES:  is allergic to nortriptyline and sulfa antibiotics.  MEDICATIONS:  Current Outpatient Medications  Medication Sig Dispense Refill   ondansetron  (ZOFRAN ) 4 MG tablet Take 1-2 tablets (4-8 mg total) by mouth every 8 (eight) hours as needed for nausea or vomiting. 20 tablet 0   acetaminophen  (TYLENOL ) 500 MG tablet Take 1,000 mg by mouth 2 (two) times daily.      allopurinol  (ZYLOPRIM ) 300 MG tablet Take 1 tablet (300 mg total) by mouth daily. (Patient not taking:  Reported on 06/21/2021) 90 tablet 3   amLODipine  (NORVASC ) 2.5 MG tablet Take 1-2 tablets (2.5-5 mg total) by mouth daily. Start with 2.5 mg dose if BP >130/>80 take 2 pills =5 mg (Patient taking differently: Take 5 mg by mouth daily.) 180 tablet 3   atorvastatin  (LIPITOR) 40 MG tablet Take 1 tablet (40 mg total) by mouth daily at 6 PM. 90 tablet 3   capecitabine  (XELODA ) 500 MG tablet Take 4 tabs in morning and 3 tabs in evening, take after meals, take on days of radiation only, Monday through Friday 105 tablet 1   Cholecalciferol (VITAMIN D3) 5000 units CAPS Take 5,000 Units by mouth daily.      Cyanocobalamin (B-12) 5000 MCG CAPS Take 5,000 mcg by mouth daily.      ezetimibe  (ZETIA ) 10 MG tablet Take 1 tablet (10 mg total) by mouth at bedtime. 90 tablet 3   febuxostat  (ULORIC ) 40 MG tablet Take 1 tablet (40 mg total) by mouth daily. Hold allopurinol  90 tablet 3   gabapentin  (NEURONTIN ) 300 MG capsule Take 1 capsule (300 mg total) by mouth at bedtime. 90 capsule 3   ketoconazole  (NIZORAL ) 2 % cream Apply 1 application topically 2 (two) times daily as needed for irritation. FACE 30 g 11   Magnesium 500 MG CAPS Take 500 mg by mouth at bedtime.      methocarbamol  (ROBAXIN ) 750 MG tablet TAKE ONE TABLET BY MOUTH EVERY NIGHT AT BEDTIME AS NEEDED MUSCLE SPASMS (Patient taking differently: at bedtime.) 30 tablet 11   pantoprazole  (PROTONIX ) 40 MG tablet Take 1 tablet (40 mg total) by mouth at bedtime. (Patient not taking: Reported on 06/21/2021) 90 tablet 3   potassium chloride  (MICRO-K ) 10 MEQ CR capsule Take 2 capsules (20 mEq total) by mouth every morning. 180 capsule 3   traMADol  (ULTRAM ) 50 MG tablet TAKE TWO TABLETS BY MOUTH THREE TIMES A DAY AS NEEDED (Patient taking differently: Takes 1 tab in am , 2 at hs) 170 tablet 5   triamterene -hydrochlorothiazide  (MAXZIDE -25) 37.5-25 MG tablet Take 1 tablet by mouth every morning. 90 tablet 3   No current facility-administered medications for this visit.     PHYSICAL EXAMINATION: ECOG PERFORMANCE STATUS: 0 - Asymptomatic  Vitals:   07/10/21 0939  BP: (!) 145/69  Pulse: 88  Resp: 18  Temp: 98.5 F (36.9 C)  SpO2: 100%   Wt Readings from Last  3 Encounters:  07/10/21 218 lb 12.8 oz (99.2 kg)  06/22/21 219 lb 3.2 oz (99.4 kg)  06/19/21 218 lb 14.4 oz (99.3 kg)     GENERAL:alert, no distress and comfortable SKIN: skin color normal, no rashes or significant lesions EYES: normal, Conjunctiva are pink and non-injected, sclera clear  NEURO: alert & oriented x 3 with fluent speech  LABORATORY DATA:  I have reviewed the data as listed    Latest Ref Rng & Units 07/10/2021    9:21 AM 07/03/2021    9:45 AM 05/26/2021    9:48 AM  CBC  WBC 4.0 - 10.5 K/uL 6.0   7.6     Hemoglobin 12.0 - 15.0 g/dL 86.5   86.6   85.6    Hematocrit 36.0 - 46.0 % 40.7   40.4   42.0    Platelets 150 - 400 K/uL 244   228           Latest Ref Rng & Units 07/10/2021    9:21 AM 07/03/2021    9:45 AM 05/26/2021    9:48 AM  CMP  Glucose 70 - 99 mg/dL 884   877   882    BUN 8 - 23 mg/dL 23   22   21     Creatinine 0.44 - 1.00 mg/dL 9.00   8.96   9.19    Sodium 135 - 145 mmol/L 138   137   140    Potassium 3.5 - 5.1 mmol/L 3.5   3.9   3.8    Chloride 98 - 111 mmol/L 100   101   100    CO2 22 - 32 mmol/L 29   29     Calcium  8.9 - 10.3 mg/dL 89.9   9.8     Total Protein 6.5 - 8.1 g/dL 7.3   7.1     Total Bilirubin 0.3 - 1.2 mg/dL 0.9   0.7     Alkaline Phos 38 - 126 U/L 91   96     AST 15 - 41 U/L 16   16     ALT 0 - 44 U/L 22   22         RADIOGRAPHIC STUDIES: I have personally reviewed the radiological images as listed and agreed with the findings in the report. No results found.    No orders of the defined types were placed in this encounter.  All questions were answered. The patient knows to call the clinic with any problems, questions or concerns. No barriers to learning was detected. The total time spent in the appointment was 25 minutes.      Onita Mattock, MD 07/10/2021   I, Izetta Neither, am acting as scribe for Onita Mattock, MD.   I have reviewed the above documentation for accuracy and completeness, and I agree with the above.     "

## 2021-07-11 ENCOUNTER — Other Ambulatory Visit: Payer: Self-pay

## 2021-07-11 ENCOUNTER — Ambulatory Visit
Admission: RE | Admit: 2021-07-11 | Discharge: 2021-07-11 | Disposition: A | Discharge status: Home / Self Care | Payer: Medicare HMO | Source: Ambulatory Visit | Attending: Radiation Oncology | Admitting: Radiation Oncology

## 2021-07-11 DIAGNOSIS — C20 Malignant neoplasm of rectum: Secondary | ICD-10-CM | POA: Diagnosis not present

## 2021-07-11 DIAGNOSIS — Z510 Encounter for antineoplastic radiation therapy: Secondary | ICD-10-CM | POA: Diagnosis not present

## 2021-07-11 LAB — RAD ONC ARIA SESSION SUMMARY
Course Elapsed Days: 8
Plan Fractions Treated to Date: 7
Plan Prescribed Dose Per Fraction: 1.8 Gy
Plan Total Fractions Prescribed: 25
Plan Total Prescribed Dose: 45 Gy
Reference Point Dosage Given to Date: 12.6 Gy
Reference Point Session Dosage Given: 1.8 Gy
Session Number: 7

## 2021-07-12 ENCOUNTER — Other Ambulatory Visit: Payer: Self-pay

## 2021-07-12 ENCOUNTER — Ambulatory Visit
Admission: RE | Admit: 2021-07-12 | Discharge: 2021-07-12 | Disposition: A | Discharge status: Home / Self Care | Payer: Medicare HMO | Source: Ambulatory Visit | Attending: Radiation Oncology | Admitting: Radiation Oncology

## 2021-07-12 ENCOUNTER — Other Ambulatory Visit (HOSPITAL_COMMUNITY): Payer: Self-pay

## 2021-07-12 DIAGNOSIS — Z510 Encounter for antineoplastic radiation therapy: Secondary | ICD-10-CM | POA: Diagnosis not present

## 2021-07-12 DIAGNOSIS — C20 Malignant neoplasm of rectum: Secondary | ICD-10-CM | POA: Diagnosis not present

## 2021-07-12 LAB — RAD ONC ARIA SESSION SUMMARY
Course Elapsed Days: 9
Plan Fractions Treated to Date: 8
Plan Prescribed Dose Per Fraction: 1.8 Gy
Plan Total Fractions Prescribed: 25
Plan Total Prescribed Dose: 45 Gy
Reference Point Dosage Given to Date: 14.4 Gy
Reference Point Session Dosage Given: 1.8 Gy
Session Number: 8

## 2021-07-13 ENCOUNTER — Other Ambulatory Visit: Payer: Self-pay

## 2021-07-13 ENCOUNTER — Telehealth: Payer: Self-pay

## 2021-07-13 ENCOUNTER — Other Ambulatory Visit (HOSPITAL_COMMUNITY): Payer: Self-pay

## 2021-07-13 ENCOUNTER — Ambulatory Visit
Admission: RE | Admit: 2021-07-13 | Discharge: 2021-07-13 | Disposition: A | Discharge status: Home / Self Care | Payer: Medicare HMO | Source: Ambulatory Visit | Attending: Radiation Oncology | Admitting: Radiation Oncology

## 2021-07-13 DIAGNOSIS — C20 Malignant neoplasm of rectum: Secondary | ICD-10-CM | POA: Diagnosis not present

## 2021-07-13 DIAGNOSIS — Z510 Encounter for antineoplastic radiation therapy: Secondary | ICD-10-CM | POA: Diagnosis not present

## 2021-07-13 LAB — RAD ONC ARIA SESSION SUMMARY
Course Elapsed Days: 10
Plan Fractions Treated to Date: 9
Plan Prescribed Dose Per Fraction: 1.8 Gy
Plan Total Fractions Prescribed: 25
Plan Total Prescribed Dose: 45 Gy
Reference Point Dosage Given to Date: 16.2 Gy
Reference Point Session Dosage Given: 1.8 Gy
Session Number: 9

## 2021-07-13 NOTE — Telephone Encounter (Signed)
S/w pt and informed her that we received fax letter from [facility] stating that the FDA identified a recall on Pantoprazole Sodium 40 mg Delayed Released Tablets, due to "all pills looking discolored and they are unsure if they are dirty, they are all a tan brown with white spots." Pt stated she has not been taking protonix due to taking chemo pill Uloric and was advised to stop protonix and take prilosec otc. Pt has 4 wks left of chemotherapy.  ?

## 2021-07-14 ENCOUNTER — Other Ambulatory Visit: Payer: Self-pay

## 2021-07-14 ENCOUNTER — Ambulatory Visit
Admission: RE | Admit: 2021-07-14 | Discharge: 2021-07-14 | Disposition: A | Discharge status: Home / Self Care | Payer: Medicare HMO | Source: Ambulatory Visit | Attending: Radiation Oncology | Admitting: Radiation Oncology

## 2021-07-14 DIAGNOSIS — C20 Malignant neoplasm of rectum: Secondary | ICD-10-CM | POA: Diagnosis not present

## 2021-07-14 DIAGNOSIS — Z510 Encounter for antineoplastic radiation therapy: Secondary | ICD-10-CM | POA: Diagnosis not present

## 2021-07-14 LAB — RAD ONC ARIA SESSION SUMMARY
Course Elapsed Days: 11
Plan Fractions Treated to Date: 10
Plan Prescribed Dose Per Fraction: 1.8 Gy
Plan Total Fractions Prescribed: 25
Plan Total Prescribed Dose: 45 Gy
Reference Point Dosage Given to Date: 18 Gy
Reference Point Session Dosage Given: 1.8 Gy
Session Number: 10

## 2021-07-17 ENCOUNTER — Inpatient Hospital Stay: Payer: Medicare HMO

## 2021-07-17 ENCOUNTER — Other Ambulatory Visit: Payer: Self-pay

## 2021-07-17 ENCOUNTER — Ambulatory Visit
Admission: RE | Admit: 2021-07-17 | Discharge: 2021-07-17 | Disposition: A | Discharge status: Home / Self Care | Payer: Medicare HMO | Source: Ambulatory Visit | Attending: Radiation Oncology | Admitting: Radiation Oncology

## 2021-07-17 DIAGNOSIS — M8580 Other specified disorders of bone density and structure, unspecified site: Secondary | ICD-10-CM | POA: Diagnosis not present

## 2021-07-17 DIAGNOSIS — Z510 Encounter for antineoplastic radiation therapy: Secondary | ICD-10-CM | POA: Diagnosis not present

## 2021-07-17 DIAGNOSIS — I10 Essential (primary) hypertension: Secondary | ICD-10-CM | POA: Diagnosis not present

## 2021-07-17 DIAGNOSIS — C20 Malignant neoplasm of rectum: Secondary | ICD-10-CM

## 2021-07-17 DIAGNOSIS — Z79899 Other long term (current) drug therapy: Secondary | ICD-10-CM | POA: Diagnosis not present

## 2021-07-17 LAB — CMP (CANCER CENTER ONLY)
ALT: 22 U/L (ref 0–44)
AST: 18 U/L (ref 15–41)
Albumin: 4.3 g/dL (ref 3.5–5.0)
Alkaline Phosphatase: 74 U/L (ref 38–126)
Anion gap: 8 (ref 5–15)
BUN: 18 mg/dL (ref 8–23)
CO2: 32 mmol/L (ref 22–32)
Calcium: 9.8 mg/dL (ref 8.9–10.3)
Chloride: 98 mmol/L (ref 98–111)
Creatinine: 0.89 mg/dL (ref 0.44–1.00)
GFR, Estimated: >60 mL/min
Glucose, Bld: 113 mg/dL — ABNORMAL HIGH (ref 70–99)
Potassium: 3.5 mmol/L (ref 3.5–5.1)
Sodium: 138 mmol/L (ref 135–145)
Total Bilirubin: 0.9 mg/dL (ref 0.3–1.2)
Total Protein: 7 g/dL (ref 6.5–8.1)

## 2021-07-17 LAB — CBC WITH DIFFERENTIAL (CANCER CENTER ONLY)
Abs Immature Granulocytes: 0.01 10*3/uL (ref 0.00–0.07)
Basophils Absolute: 0 10*3/uL (ref 0.0–0.1)
Basophils Relative: 0 %
Eosinophils Absolute: 0.2 10*3/uL (ref 0.0–0.5)
Eosinophils Relative: 4 %
HCT: 36.9 % (ref 36.0–46.0)
Hemoglobin: 12.5 g/dL (ref 12.0–15.0)
Immature Granulocytes: 0 %
Lymphocytes Relative: 20 %
Lymphs Abs: 1.2 10*3/uL (ref 0.7–4.0)
MCH: 32.1 pg (ref 26.0–34.0)
MCHC: 33.9 g/dL (ref 30.0–36.0)
MCV: 94.9 fL (ref 80.0–100.0)
Monocytes Absolute: 0.4 10*3/uL (ref 0.1–1.0)
Monocytes Relative: 6 %
Neutro Abs: 4 10*3/uL (ref 1.7–7.7)
Neutrophils Relative %: 70 %
Platelet Count: 193 10*3/uL (ref 150–400)
RBC: 3.89 MIL/uL (ref 3.87–5.11)
RDW: 15.5 % (ref 11.5–15.5)
WBC Count: 5.7 10*3/uL (ref 4.0–10.5)
nRBC: 0 % (ref 0.0–0.2)

## 2021-07-17 LAB — RAD ONC ARIA SESSION SUMMARY
Course Elapsed Days: 14
Plan Fractions Treated to Date: 11
Plan Prescribed Dose Per Fraction: 1.8 Gy
Plan Total Fractions Prescribed: 25
Plan Total Prescribed Dose: 45 Gy
Reference Point Dosage Given to Date: 19.8 Gy
Reference Point Session Dosage Given: 1.8 Gy
Session Number: 11

## 2021-07-18 ENCOUNTER — Telehealth: Payer: Self-pay | Admitting: Licensed Clinical Social Worker

## 2021-07-18 ENCOUNTER — Other Ambulatory Visit: Payer: Self-pay

## 2021-07-18 ENCOUNTER — Ambulatory Visit
Admission: RE | Admit: 2021-07-18 | Discharge: 2021-07-18 | Disposition: A | Discharge status: Home / Self Care | Payer: Medicare HMO | Source: Ambulatory Visit | Attending: Radiation Oncology | Admitting: Radiation Oncology

## 2021-07-18 DIAGNOSIS — C20 Malignant neoplasm of rectum: Secondary | ICD-10-CM | POA: Diagnosis not present

## 2021-07-18 DIAGNOSIS — Z510 Encounter for antineoplastic radiation therapy: Secondary | ICD-10-CM | POA: Diagnosis not present

## 2021-07-18 LAB — RAD ONC ARIA SESSION SUMMARY
Course Elapsed Days: 15
Plan Fractions Treated to Date: 12
Plan Prescribed Dose Per Fraction: 1.8 Gy
Plan Total Fractions Prescribed: 25
Plan Total Prescribed Dose: 45 Gy
Reference Point Dosage Given to Date: 21.6 Gy
Reference Point Session Dosage Given: 1.8 Gy
Session Number: 12

## 2021-07-19 ENCOUNTER — Other Ambulatory Visit (HOSPITAL_COMMUNITY): Payer: Self-pay

## 2021-07-19 ENCOUNTER — Other Ambulatory Visit: Payer: Self-pay

## 2021-07-19 ENCOUNTER — Ambulatory Visit
Admission: RE | Admit: 2021-07-19 | Discharge: 2021-07-19 | Disposition: A | Discharge status: Home / Self Care | Payer: Medicare HMO | Source: Ambulatory Visit | Attending: Radiation Oncology | Admitting: Radiation Oncology

## 2021-07-19 DIAGNOSIS — C20 Malignant neoplasm of rectum: Secondary | ICD-10-CM

## 2021-07-19 DIAGNOSIS — Z510 Encounter for antineoplastic radiation therapy: Secondary | ICD-10-CM | POA: Diagnosis not present

## 2021-07-19 LAB — RAD ONC ARIA SESSION SUMMARY
Course Elapsed Days: 16
Plan Fractions Treated to Date: 13
Plan Prescribed Dose Per Fraction: 1.8 Gy
Plan Total Fractions Prescribed: 25
Plan Total Prescribed Dose: 45 Gy
Reference Point Dosage Given to Date: 23.4 Gy
Reference Point Session Dosage Given: 1.8 Gy
Session Number: 13

## 2021-07-19 MED ORDER — CAPECITABINE 500 MG PO TABS
ORAL_TABLET | ORAL | 1 refills | Status: DC
  Filled 2021-07-19: qty 105, 21d supply, fill #0
  Filled 2021-07-19: qty 105, fill #0

## 2021-07-20 ENCOUNTER — Ambulatory Visit
Admission: RE | Admit: 2021-07-20 | Discharge: 2021-07-20 | Disposition: A | Discharge status: Home / Self Care | Payer: Medicare HMO | Source: Ambulatory Visit | Attending: Radiation Oncology | Admitting: Radiation Oncology

## 2021-07-20 ENCOUNTER — Other Ambulatory Visit: Payer: Self-pay

## 2021-07-20 DIAGNOSIS — Z510 Encounter for antineoplastic radiation therapy: Secondary | ICD-10-CM | POA: Diagnosis not present

## 2021-07-20 DIAGNOSIS — C20 Malignant neoplasm of rectum: Secondary | ICD-10-CM | POA: Diagnosis not present

## 2021-07-20 LAB — RAD ONC ARIA SESSION SUMMARY
Course Elapsed Days: 17
Plan Fractions Treated to Date: 14
Plan Prescribed Dose Per Fraction: 1.8 Gy
Plan Total Fractions Prescribed: 25
Plan Total Prescribed Dose: 45 Gy
Reference Point Dosage Given to Date: 25.2 Gy
Reference Point Session Dosage Given: 1.8 Gy
Session Number: 14

## 2021-07-21 ENCOUNTER — Ambulatory Visit
Admission: RE | Admit: 2021-07-21 | Discharge: 2021-07-21 | Disposition: A | Discharge status: Home / Self Care | Payer: Medicare HMO | Source: Ambulatory Visit | Attending: Radiation Oncology | Admitting: Radiation Oncology

## 2021-07-21 ENCOUNTER — Other Ambulatory Visit: Payer: Self-pay

## 2021-07-21 DIAGNOSIS — Z510 Encounter for antineoplastic radiation therapy: Secondary | ICD-10-CM | POA: Diagnosis not present

## 2021-07-21 DIAGNOSIS — C20 Malignant neoplasm of rectum: Secondary | ICD-10-CM | POA: Diagnosis not present

## 2021-07-21 LAB — RAD ONC ARIA SESSION SUMMARY
Course Elapsed Days: 18
Plan Fractions Treated to Date: 15
Plan Prescribed Dose Per Fraction: 1.8 Gy
Plan Total Fractions Prescribed: 25
Plan Total Prescribed Dose: 45 Gy
Reference Point Dosage Given to Date: 27 Gy
Reference Point Session Dosage Given: 1.8 Gy
Session Number: 15

## 2021-07-24 ENCOUNTER — Other Ambulatory Visit (HOSPITAL_COMMUNITY): Payer: Self-pay

## 2021-07-24 ENCOUNTER — Ambulatory Visit
Admission: RE | Admit: 2021-07-24 | Discharge: 2021-07-24 | Disposition: A | Discharge status: Home / Self Care | Payer: Medicare HMO | Source: Ambulatory Visit | Attending: Radiation Oncology | Admitting: Radiation Oncology

## 2021-07-24 ENCOUNTER — Inpatient Hospital Stay: Payer: Medicare HMO

## 2021-07-24 ENCOUNTER — Other Ambulatory Visit: Payer: Self-pay

## 2021-07-24 ENCOUNTER — Inpatient Hospital Stay: Payer: Medicare HMO | Admitting: Hematology

## 2021-07-24 ENCOUNTER — Encounter: Payer: Self-pay | Admitting: Licensed Clinical Social Worker

## 2021-07-24 ENCOUNTER — Encounter: Payer: Self-pay | Admitting: Hematology

## 2021-07-24 VITALS — BP 144/59 | HR 82 | Temp 98.5°F | Resp 18 | Ht 66.0 in | Wt 216.1 lb

## 2021-07-24 DIAGNOSIS — C20 Malignant neoplasm of rectum: Secondary | ICD-10-CM | POA: Diagnosis not present

## 2021-07-24 DIAGNOSIS — I10 Essential (primary) hypertension: Secondary | ICD-10-CM | POA: Diagnosis not present

## 2021-07-24 DIAGNOSIS — M8580 Other specified disorders of bone density and structure, unspecified site: Secondary | ICD-10-CM | POA: Diagnosis not present

## 2021-07-24 DIAGNOSIS — Z79899 Other long term (current) drug therapy: Secondary | ICD-10-CM | POA: Diagnosis not present

## 2021-07-24 DIAGNOSIS — Z510 Encounter for antineoplastic radiation therapy: Secondary | ICD-10-CM | POA: Diagnosis not present

## 2021-07-24 LAB — CBC WITH DIFFERENTIAL (CANCER CENTER ONLY)
Abs Immature Granulocytes: 0.03 10*3/uL (ref 0.00–0.07)
Basophils Absolute: 0 10*3/uL (ref 0.0–0.1)
Basophils Relative: 0 %
Eosinophils Absolute: 0.2 10*3/uL (ref 0.0–0.5)
Eosinophils Relative: 3 %
HCT: 36 % (ref 36.0–46.0)
Hemoglobin: 12.3 g/dL (ref 12.0–15.0)
Immature Granulocytes: 1 %
Lymphocytes Relative: 14 %
Lymphs Abs: 0.9 10*3/uL (ref 0.7–4.0)
MCH: 32.5 pg (ref 26.0–34.0)
MCHC: 34.2 g/dL (ref 30.0–36.0)
MCV: 95.2 fL (ref 80.0–100.0)
Monocytes Absolute: 0.4 10*3/uL (ref 0.1–1.0)
Monocytes Relative: 7 %
Neutro Abs: 4.7 10*3/uL (ref 1.7–7.7)
Neutrophils Relative %: 75 %
Platelet Count: 223 10*3/uL (ref 150–400)
RBC: 3.78 MIL/uL — ABNORMAL LOW (ref 3.87–5.11)
RDW: 16.4 % — ABNORMAL HIGH (ref 11.5–15.5)
WBC Count: 6.2 10*3/uL (ref 4.0–10.5)
nRBC: 0 % (ref 0.0–0.2)

## 2021-07-24 LAB — RAD ONC ARIA SESSION SUMMARY
Course Elapsed Days: 21
Plan Fractions Treated to Date: 16
Plan Prescribed Dose Per Fraction: 1.8 Gy
Plan Total Fractions Prescribed: 25
Plan Total Prescribed Dose: 45 Gy
Reference Point Dosage Given to Date: 28.8 Gy
Reference Point Session Dosage Given: 1.8 Gy
Session Number: 16

## 2021-07-24 LAB — CMP (CANCER CENTER ONLY)
ALT: 21 U/L (ref 0–44)
AST: 17 U/L (ref 15–41)
Albumin: 4.3 g/dL (ref 3.5–5.0)
Alkaline Phosphatase: 78 U/L (ref 38–126)
Anion gap: 8 (ref 5–15)
BUN: 19 mg/dL (ref 8–23)
CO2: 32 mmol/L (ref 22–32)
Calcium: 9.9 mg/dL (ref 8.9–10.3)
Chloride: 97 mmol/L — ABNORMAL LOW (ref 98–111)
Creatinine: 0.95 mg/dL (ref 0.44–1.00)
GFR, Estimated: >60 mL/min
Glucose, Bld: 126 mg/dL — ABNORMAL HIGH (ref 70–99)
Potassium: 3.3 mmol/L — ABNORMAL LOW (ref 3.5–5.1)
Sodium: 137 mmol/L (ref 135–145)
Total Bilirubin: 0.8 mg/dL (ref 0.3–1.2)
Total Protein: 7.2 g/dL (ref 6.5–8.1)

## 2021-07-24 MED ORDER — TRIAMCINOLONE ACETONIDE 0.5 % EX OINT: 1.0000 "application " | TOPICAL_OINTMENT | Freq: Two times a day (BID) | CUTANEOUS | 1 refills | Status: DC

## 2021-07-24 NOTE — Progress Notes (Signed)
[facility]   [phone] [phone]   Clinic Follow up Note   Patient Care Team: McLean-Scocuzza, Nino Glow, MD as PCP - General (Internal Medicine)  Date of Service:  07/24/2021  CHIEF COMPLAINT: f/u of rectal cancer  CURRENT THERAPY:  Concurrent ChemoRT with Xeloda, 07/03/21 - 08/14/21             -Xeloda dose: $RemoveBefor'1500mg'lgWjownlSWKV$  BID M-F  ASSESSMENT & PLAN:  Cynthia Bryan is a 76 y.o. female with   1. Adenocarcinoma of the distal rectum, G2, pT2, cN0M0 stage IA, MMR normal -She presented asymptomatically to GI with positive Cologuard. Colonoscopy on 04/17/21 showed 2 cm distal rectal mass that was palpable on DRE. Path confirmed invasive moderately differentiated adenocarcinoma. -local staging MRI on 04/28/21 showed cT2N0 disease without lymphovascular invasion -staging CT on 05/09/21 was negative for distant metastasis -upfront transanal excision on 05/26/21 with Dr. Dema Severin showed 2.5 cm tumor, negative margins, MMR normal -due to moderate risk of recurrence, she was started on adjuvant concurrent chemoRT with Xeloda on 07/03/21. She has developed a rash to the back of her hands and chest, as well as diarrhea. We will decrease Xeloda slightly to $RemoveBef'1500mg'AlbazIhCfF$  BID. -labs reviewed, overall no concern.   2. Co-morbidities: Factor V heterozygous, HTN, HL, chronic pain, osteopenia, arthritis, GERD, gout -Follow-up PCP, continue med regimen -On tramadol, Robaxin, and gabapentin for chronic pain -She required postop anticoagulation for orthopedic surgery in the past   3. Social, family history  -Ms. Speciale lives independently with strong social/church support and is close to her nephew -We reviewed our immunity resources including nutrition/dietitian, social work, Clinical biochemist if needed -she has strong family history of cancer including a brother with bladder cancer, mother with breast cancer, and a paternal aunt with multiple cancers including CRC and breast.  -she underwent genetic  testing on 07/03/21; results are pending.     PLAN: -continue Xeloda, decrease to $RemoveBef'1500mg'BFshCVikSG$  BID, M-F with concurrent radiation  -continue daily radiation -I called in kenalog cream for her rash -lab weekly -f/u every other week as scheduled   No problem-specific Assessment & Plan notes found for this encounter.   SUMMARY OF ONCOLOGIC HISTORY: Oncology History  Rectal cancer ([place])  04/25/2021 Initial Biopsy   Colonoscopy with Dr. Alice Reichert in [place], [place] -Per the notes in epic, she was found to have an infiltrative, sessile, ulcerated nonobstructing mass in the distal rectum measuring 2 cm in length and diameter, 0 to 3 cm from the anal verge. 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   04/25/2021 Initial Diagnosis   Rectal cancer ([place])    04/25/2021 Cancer Staging   Staging form: Colon and Rectum, AJCC 8th Edition - Clinical stage from 04/25/2021: Stage I (cT2, cN0, cM0) - Signed by Alla Feeling, NP on 06/19/2021 Stage prefix: Initial diagnosis Total positive nodes: 0    04/25/2021 Tumor Marker   Baseline CEA: 1.8 (normal)   04/28/2021 Imaging   Local staging pelvic MRI: IMPRESSION: TUMOR DESCRIPTION Circumferential Extent: 100 degrees from 9 o'clock to 5 o'clock along the posterior RIGHT border of the rectum.   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 Lesion at anorectal junction.     05/09/2021 Imaging   Staging CT chest/abdomen IMPRESSION: 1. No evidence of metastatic disease within the chest or abdomen. 2. Hepatic steatosis. 3. Punctate nonobstructive  left renal stone. 4. Bilateral renal cysts, including a hemorrhagic/proteinaceous right upper pole renal cyst. 5.  Aortic Atherosclerosis (ICD10-I70.0).   05/26/2021 Surgery   Transanal excision of distal rectal  cancer by Dr. Nadeen Landau   05/26/2021 Pathology Results   A. RECTUM, RIGHT ANTERIOR LATERAL DISTAL, RESECTION:  - Invasive moderately differentiated adenocarcinoma, 2.5 cm  - Carcinoma focally invades into superficial muscularis propria  - Resection margins are negative for carcinoma  - Lateral mucosal margins are negative for dysplasia  - No evidence of lymphovascular invasion  - See oncology table  pT2, PN X IHC EXPRESSION RESULTS  TEST           RESULT  MLH1:          Preserved nuclear expression  MSH2:          Preserved nuclear expression  MSH6:          Preserved nuclear expression  PMS2:          Preserved nuclear expression     05/26/2021 Cancer Staging   Staging form: Colon and Rectum, AJCC 8th Edition - Pathologic stage from 05/26/2021: Stage Unknown (pT2, pNX, cM0) - Signed by Alla Feeling, NP on 06/19/2021       INTERVAL HISTORY:  Cynthia Bryan is here for a follow up of rectal cancer. She was last seen by me on 07/10/21. She presents to the clinic alone. She is developing a rash to the backs of her hands and her chest. She notes the rash is itchy and burning. She also reports diarrhea, worse in the morning. She endorses using imodium, but this will cause her to move to constipation.   All other systems were reviewed with the patient and are negative.  MEDICAL HISTORY:  Past Medical History:  Diagnosis Date   Arthritis    back knees, shoulders   Basal cell carcinoma    right temple Dr. Elmer Ramp removed 01/2017    Chicken pox    as child   Chronic back pain    COVID-19 10/13/2019   scratchy throat chills fever x 6 weeks x 6 weeks all symptoms resolved   Family history of bladder cancer    Family history of breast cancer    Family history of lung cancer    Family history of stomach cancer    Fatty liver    09/19/15   Gastric ulcer    neg H pylori  diet controlled   GERD (gastroesophageal reflux disease)    Gout    Hiatal hernia    SMALL    History of kidney stones    Hyperlipidemia    Hypertension    Lumbar herniated disc    Rectal cancer ([place])    Recurrent UTI    last uti 6 months ago per pt on 05-23-2021   Rosacea    Syncope none since 2018 per pt    per pt presyncope no LOC felt dizzy/vision distorted and couldnt speak EEG neg, CT head neg 07/31/16, f/u [facility] cardiology with US carotid, echo but no Holter    Umbilical hernia    noted 09/19/15    UTI (urinary tract infection)    Vitamin B 12 deficiency    Vitamin D deficiency    Wears glasses     SURGICAL HISTORY: Past Surgical History:  Procedure Laterality Date   BREAST BIOPSY Right 1980   cyst removed negative pathology    colonscopy  04/17/2021   at [facility]  W/ RETROGRADES Left 02/18/2020   Procedure: CYSTOSCOPY WITH RETROGRADE PYELOGRAM;  Surgeon: Royston Cowper, MD;  Location: [facility];  Service: Urology;  Laterality: Left;   EXTRACORPOREAL SHOCK WAVE LITHOTRIPSY Left 09/19/2017   Procedure: EXTRACORPOREAL SHOCK WAVE LITHOTRIPSY (ESWL);  Surgeon: Royston Cowper, MD;  Location: [facility];  Service: Urology;  Laterality: Left;   JOINT REPLACEMENT     right knee 11/2015 Dr. Janell Quiet [facility] N/A 05/26/2021   Procedure: ANORECTAL EXAM UNDER ANESTHESIA;  Surgeon: Ileana Roup, MD;  Location: [facility];  Service: General;  Laterality: N/A;   TRANSANAL EXCISION OF RECTAL MASS N/A 05/26/2021   Procedure: TRANSANAL EXCISION OF RIGHT ANTERIOR LATERAL DISTAL RECTAL CANCER;  Surgeon: Ileana Roup, MD;  Location: [facility];  Service: General;  Laterality: N/A;    I have reviewed the social history and family history with the patient and they are unchanged from previous note.  ALLERGIES:  is allergic to nortriptyline and sulfa antibiotics.  MEDICATIONS:  Current Outpatient Medications  Medication Sig Dispense Refill   triamcinolone ointment (KENALOG) 0.5 % Apply 1  application. topically 2 (two) times daily. 30 g 1   acetaminophen (TYLENOL) 500 MG tablet Take 1,000 mg by mouth 2 (two) times daily.      allopurinol (ZYLOPRIM) 300 MG tablet Take 1 tablet (300 mg total) by mouth daily. (Patient not taking: Reported on 06/21/2021) 90 tablet 3   amLODipine (NORVASC) 2.5 MG tablet Take 1-2 tablets (2.5-5 mg total) by mouth daily. Start with 2.5 mg dose if BP >130/>80 take 2 pills =5 mg (Patient taking differently: Take 5 mg by mouth daily.) 180 tablet 3   atorvastatin (LIPITOR) 40 MG tablet Take 1 tablet (40 mg total) by mouth daily at 6 PM. 90 tablet 3   capecitabine (XELODA) 500 MG tablet Take 4 tabs in morning and 3 tabs in evening, take after meals, take on days of radiation only, Monday through Friday 105 tablet 1   Cholecalciferol (VITAMIN D3) 5000 units CAPS Take 5,000 Units by mouth daily.      Cyanocobalamin (B-12) 5000 MCG CAPS Take 5,000 mcg by mouth daily.      ezetimibe (ZETIA) 10 MG tablet Take 1 tablet (10 mg total) by mouth at bedtime. 90 tablet 3   febuxostat (ULORIC) 40 MG tablet Take 1 tablet (40 mg total) by mouth daily. Hold allopurinol 90 tablet 3   gabapentin (NEURONTIN) 300 MG capsule Take 1 capsule (300 mg total) by mouth at bedtime. 90 capsule 3   ketoconazole (NIZORAL) 2 % cream Apply 1 application topically 2 (two) times daily as needed for irritation. FACE 30 g 11   Magnesium 500 MG CAPS Take 500 mg by mouth at bedtime.      methocarbamol (ROBAXIN) 750 MG tablet TAKE ONE TABLET BY MOUTH EVERY NIGHT AT BEDTIME AS NEEDED MUSCLE SPASMS (Patient taking differently: at bedtime.) 30 tablet 11   ondansetron (ZOFRAN) 4 MG tablet Take 1-2 tablets (4-8 mg total) by mouth every 8 (eight) hours as needed for nausea or vomiting. 20 tablet 0   pantoprazole (PROTONIX) 40 MG tablet Take 1 tablet (40 mg total) by mouth at bedtime. (Patient not taking: Reported on 06/21/2021) 90 tablet 3   potassium chloride (MICRO-K) 10 MEQ CR capsule Take 2 capsules (20  mEq total) by mouth every morning. 180 capsule 3   traMADol (ULTRAM) 50 MG tablet TAKE TWO TABLETS BY MOUTH THREE  TIMES A DAY AS NEEDED (Patient taking differently: Takes 1 tab in am , 2 at hs) 170 tablet 5   triamterene-hydrochlorothiazide (MAXZIDE-25) 37.5-25 MG tablet Take 1 tablet by mouth every morning. 90 tablet 3   No current facility-administered medications for this visit.    PHYSICAL EXAMINATION: ECOG PERFORMANCE STATUS: 1 - Symptomatic but completely ambulatory  Vitals:   07/24/21 1240  BP: (!) 144/59  Pulse: 82  Resp: 18  Temp: 98.5 F (36.9 C)  SpO2: 100%   Wt Readings from Last 3 Encounters:  07/24/21 216 lb 1.6 oz (98 kg)  07/10/21 218 lb 12.8 oz (99.2 kg)  06/22/21 219 lb 3.2 oz (99.4 kg)     GENERAL:alert, no distress and comfortable SKIN: skin color normal, (+) diffuse papular skin rash on her hands and upper arm, upper chest (see picture below) EYES: normal, Conjunctiva are pink and non-injected, sclera clear  NEURO: alert & oriented x 3 with fluent speech   LABORATORY DATA:  I have reviewed the data as listed    Latest Ref Rng & Units 07/24/2021   11:38 AM 07/17/2021   10:28 AM 07/10/2021    9:21 AM  CBC  WBC 4.0 - 10.5 K/uL 6.2   5.7   6.0    Hemoglobin 12.0 - 15.0 g/dL 12.3   12.5   13.4    Hematocrit 36.0 - 46.0 % 36.0   36.9   40.7    Platelets 150 - 400 K/uL 223   193   244          Latest Ref Rng & Units 07/24/2021   11:38 AM 07/17/2021   10:28 AM 07/10/2021    9:21 AM  CMP  Glucose 70 - 99 mg/dL 126   113   115    BUN 8 - 23 mg/dL $Remove'19   18   23    'dhUOrav$ Creatinine 0.44 - 1.00 mg/dL 0.95   0.89   0.99    Sodium 135 - 145 mmol/L 137   138   138    Potassium 3.5 - 5.1 mmol/L 3.3   3.5   3.5    Chloride 98 - 111 mmol/L 97   98   100    CO2 22 - 32 mmol/L 32   32   29    Calcium 8.9 - 10.3 mg/dL 9.9   9.8   10.0    Total Protein 6.5 - 8.1 g/dL 7.2   7.0   7.3    Total Bilirubin 0.3 - 1.2 mg/dL 0.8   0.9   0.9    Alkaline Phos 38 - 126 U/L 78   74    91    AST 15 - 41 U/L $Remo'17   18   16    'CQgHP$ ALT 0 - 44 U/L $Remo'21   22   22        'WFWwL$ RADIOGRAPHIC STUDIES: I have personally reviewed the radiological images as listed and agreed with the findings in the report. No results found.    No orders of the defined types were placed in this encounter.  All questions were answered. The patient knows to call the clinic with any problems, questions or concerns. No barriers to learning was detected. The total time spent in the appointment was 30 minutes.     Truitt Merle, MD 07/24/2021   I, Wilburn Mylar, am acting as scribe for Truitt Merle, MD.   I have reviewed the above documentation for accuracy and completeness,  and I agree with the above.

## 2021-07-25 ENCOUNTER — Ambulatory Visit
Admission: RE | Admit: 2021-07-25 | Discharge: 2021-07-25 | Disposition: A | Discharge status: Home / Self Care | Payer: Medicare HMO | Source: Ambulatory Visit | Attending: Radiation Oncology | Admitting: Radiation Oncology

## 2021-07-25 ENCOUNTER — Other Ambulatory Visit: Payer: Self-pay

## 2021-07-25 DIAGNOSIS — C20 Malignant neoplasm of rectum: Secondary | ICD-10-CM | POA: Diagnosis not present

## 2021-07-25 DIAGNOSIS — Z510 Encounter for antineoplastic radiation therapy: Secondary | ICD-10-CM | POA: Diagnosis not present

## 2021-07-25 LAB — RAD ONC ARIA SESSION SUMMARY
Course Elapsed Days: 22
Plan Fractions Treated to Date: 17
Plan Prescribed Dose Per Fraction: 1.8 Gy
Plan Total Fractions Prescribed: 25
Plan Total Prescribed Dose: 45 Gy
Reference Point Dosage Given to Date: 30.6 Gy
Reference Point Session Dosage Given: 1.8 Gy
Session Number: 17

## 2021-07-26 ENCOUNTER — Other Ambulatory Visit: Payer: Self-pay

## 2021-07-26 ENCOUNTER — Ambulatory Visit
Admission: RE | Admit: 2021-07-26 | Discharge: 2021-07-26 | Disposition: A | Discharge status: Home / Self Care | Payer: Medicare HMO | Source: Ambulatory Visit | Attending: Radiation Oncology | Admitting: Radiation Oncology

## 2021-07-26 DIAGNOSIS — Z510 Encounter for antineoplastic radiation therapy: Secondary | ICD-10-CM | POA: Diagnosis not present

## 2021-07-26 DIAGNOSIS — C20 Malignant neoplasm of rectum: Secondary | ICD-10-CM | POA: Diagnosis not present

## 2021-07-26 LAB — RAD ONC ARIA SESSION SUMMARY
Course Elapsed Days: 23
Plan Fractions Treated to Date: 18
Plan Prescribed Dose Per Fraction: 1.8 Gy
Plan Total Fractions Prescribed: 25
Plan Total Prescribed Dose: 45 Gy
Reference Point Dosage Given to Date: 32.4 Gy
Reference Point Session Dosage Given: 1.8 Gy
Session Number: 18

## 2021-07-27 ENCOUNTER — Other Ambulatory Visit: Payer: Self-pay

## 2021-07-27 ENCOUNTER — Ambulatory Visit
Admission: RE | Admit: 2021-07-27 | Discharge: 2021-07-27 | Disposition: A | Discharge status: Home / Self Care | Payer: Medicare HMO | Source: Ambulatory Visit | Attending: Radiation Oncology | Admitting: Radiation Oncology

## 2021-07-27 DIAGNOSIS — C20 Malignant neoplasm of rectum: Secondary | ICD-10-CM | POA: Diagnosis not present

## 2021-07-27 DIAGNOSIS — Z510 Encounter for antineoplastic radiation therapy: Secondary | ICD-10-CM | POA: Diagnosis not present

## 2021-07-27 LAB — RAD ONC ARIA SESSION SUMMARY
Course Elapsed Days: 24
Plan Fractions Treated to Date: 19
Plan Prescribed Dose Per Fraction: 1.8 Gy
Plan Total Fractions Prescribed: 25
Plan Total Prescribed Dose: 45 Gy
Reference Point Dosage Given to Date: 34.2 Gy
Reference Point Session Dosage Given: 1.8 Gy
Session Number: 19

## 2021-07-28 ENCOUNTER — Other Ambulatory Visit: Payer: Self-pay

## 2021-07-28 ENCOUNTER — Ambulatory Visit
Admission: RE | Admit: 2021-07-28 | Discharge: 2021-07-28 | Disposition: A | Discharge status: Home / Self Care | Payer: Medicare HMO | Source: Ambulatory Visit | Attending: Radiation Oncology | Admitting: Radiation Oncology

## 2021-07-28 DIAGNOSIS — C20 Malignant neoplasm of rectum: Secondary | ICD-10-CM | POA: Diagnosis not present

## 2021-07-28 DIAGNOSIS — Z510 Encounter for antineoplastic radiation therapy: Secondary | ICD-10-CM | POA: Diagnosis not present

## 2021-07-28 LAB — RAD ONC ARIA SESSION SUMMARY
Course Elapsed Days: 25
Plan Fractions Treated to Date: 20
Plan Prescribed Dose Per Fraction: 1.8 Gy
Plan Total Fractions Prescribed: 25
Plan Total Prescribed Dose: 45 Gy
Reference Point Dosage Given to Date: 36 Gy
Reference Point Session Dosage Given: 1.8 Gy
Session Number: 20

## 2021-08-01 ENCOUNTER — Ambulatory Visit
Admission: RE | Admit: 2021-08-01 | Discharge: 2021-08-01 | Disposition: A | Discharge status: Home / Self Care | Payer: Medicare HMO | Source: Ambulatory Visit | Attending: Radiation Oncology | Admitting: Radiation Oncology

## 2021-08-01 ENCOUNTER — Encounter: Payer: Self-pay | Admitting: Internal Medicine

## 2021-08-01 ENCOUNTER — Other Ambulatory Visit (HOSPITAL_COMMUNITY): Payer: Self-pay

## 2021-08-01 ENCOUNTER — Other Ambulatory Visit: Payer: Self-pay

## 2021-08-01 ENCOUNTER — Inpatient Hospital Stay: Payer: Medicare HMO

## 2021-08-01 ENCOUNTER — Ambulatory Visit (INDEPENDENT_AMBULATORY_CARE_PROVIDER_SITE_OTHER): Payer: Medicare HMO | Admitting: Internal Medicine

## 2021-08-01 VITALS — BP 130/70 | HR 82 | Temp 98.2°F | Resp 14 | Ht 66.0 in | Wt 213.2 lb

## 2021-08-01 DIAGNOSIS — Z1329 Encounter for screening for other suspected endocrine disorder: Secondary | ICD-10-CM

## 2021-08-01 DIAGNOSIS — I10 Essential (primary) hypertension: Secondary | ICD-10-CM

## 2021-08-01 DIAGNOSIS — M5126 Other intervertebral disc displacement, lumbar region: Secondary | ICD-10-CM

## 2021-08-01 DIAGNOSIS — R7303 Prediabetes: Secondary | ICD-10-CM

## 2021-08-01 DIAGNOSIS — C20 Malignant neoplasm of rectum: Secondary | ICD-10-CM

## 2021-08-01 DIAGNOSIS — Z79899 Other long term (current) drug therapy: Secondary | ICD-10-CM | POA: Diagnosis not present

## 2021-08-01 DIAGNOSIS — M25551 Pain in right hip: Secondary | ICD-10-CM | POA: Diagnosis not present

## 2021-08-01 DIAGNOSIS — Z510 Encounter for antineoplastic radiation therapy: Secondary | ICD-10-CM | POA: Diagnosis not present

## 2021-08-01 DIAGNOSIS — E876 Hypokalemia: Secondary | ICD-10-CM

## 2021-08-01 DIAGNOSIS — K219 Gastro-esophageal reflux disease without esophagitis: Secondary | ICD-10-CM | POA: Diagnosis not present

## 2021-08-01 DIAGNOSIS — R9389 Abnormal findings on diagnostic imaging of other specified body structures: Secondary | ICD-10-CM

## 2021-08-01 DIAGNOSIS — R5383 Other fatigue: Secondary | ICD-10-CM

## 2021-08-01 DIAGNOSIS — M5416 Radiculopathy, lumbar region: Secondary | ICD-10-CM

## 2021-08-01 DIAGNOSIS — R937 Abnormal findings on diagnostic imaging of other parts of musculoskeletal system: Secondary | ICD-10-CM

## 2021-08-01 DIAGNOSIS — E785 Hyperlipidemia, unspecified: Secondary | ICD-10-CM

## 2021-08-01 DIAGNOSIS — M5440 Lumbago with sciatica, unspecified side: Secondary | ICD-10-CM

## 2021-08-01 DIAGNOSIS — N3000 Acute cystitis without hematuria: Secondary | ICD-10-CM | POA: Diagnosis not present

## 2021-08-01 DIAGNOSIS — M25552 Pain in left hip: Secondary | ICD-10-CM

## 2021-08-01 DIAGNOSIS — M8580 Other specified disorders of bone density and structure, unspecified site: Secondary | ICD-10-CM | POA: Diagnosis not present

## 2021-08-01 LAB — CMP (CANCER CENTER ONLY)
ALT: 19 U/L (ref 0–44)
AST: 17 U/L (ref 15–41)
Albumin: 4.5 g/dL (ref 3.5–5.0)
Alkaline Phosphatase: 86 U/L (ref 38–126)
Anion gap: 10 (ref 5–15)
BUN: 18 mg/dL (ref 8–23)
CO2: 29 mmol/L (ref 22–32)
Calcium: 10 mg/dL (ref 8.9–10.3)
Chloride: 97 mmol/L — ABNORMAL LOW (ref 98–111)
Creatinine: 1.04 mg/dL — ABNORMAL HIGH (ref 0.44–1.00)
GFR, Estimated: 57 mL/min — ABNORMAL LOW
Glucose, Bld: 126 mg/dL — ABNORMAL HIGH (ref 70–99)
Potassium: 3.3 mmol/L — ABNORMAL LOW (ref 3.5–5.1)
Sodium: 136 mmol/L (ref 135–145)
Total Bilirubin: 0.9 mg/dL (ref 0.3–1.2)
Total Protein: 7.3 g/dL (ref 6.5–8.1)

## 2021-08-01 LAB — RAD ONC ARIA SESSION SUMMARY
Course Elapsed Days: 29
Plan Fractions Treated to Date: 21
Plan Prescribed Dose Per Fraction: 1.8 Gy
Plan Total Fractions Prescribed: 25
Plan Total Prescribed Dose: 45 Gy
Reference Point Dosage Given to Date: 37.8 Gy
Reference Point Session Dosage Given: 1.8 Gy
Session Number: 21

## 2021-08-01 LAB — TSH: TSH: 1.19 u[IU]/mL (ref 0.35–5.50)

## 2021-08-01 LAB — CBC WITH DIFFERENTIAL (CANCER CENTER ONLY)
Abs Immature Granulocytes: 0.03 10*3/uL (ref 0.00–0.07)
Basophils Absolute: 0 10*3/uL (ref 0.0–0.1)
Basophils Relative: 0 %
Eosinophils Absolute: 0.2 10*3/uL (ref 0.0–0.5)
Eosinophils Relative: 3 %
HCT: 36 % (ref 36.0–46.0)
Hemoglobin: 12.4 g/dL (ref 12.0–15.0)
Immature Granulocytes: 0 %
Lymphocytes Relative: 11 %
Lymphs Abs: 0.8 10*3/uL (ref 0.7–4.0)
MCH: 33.5 pg (ref 26.0–34.0)
MCHC: 34.4 g/dL (ref 30.0–36.0)
MCV: 97.3 fL (ref 80.0–100.0)
Monocytes Absolute: 0.4 10*3/uL (ref 0.1–1.0)
Monocytes Relative: 6 %
Neutro Abs: 5.6 10*3/uL (ref 1.7–7.7)
Neutrophils Relative %: 80 %
Platelet Count: 259 10*3/uL (ref 150–400)
RBC: 3.7 MIL/uL — ABNORMAL LOW (ref 3.87–5.11)
RDW: 18.8 % — ABNORMAL HIGH (ref 11.5–15.5)
WBC Count: 7.1 10*3/uL (ref 4.0–10.5)
nRBC: 0 % (ref 0.0–0.2)

## 2021-08-01 LAB — BASIC METABOLIC PANEL WITH GFR
BUN: 18 mg/dL (ref 6–23)
CO2: 28 meq/L (ref 19–32)
Calcium: 9.8 mg/dL (ref 8.4–10.5)
Chloride: 96 meq/L (ref 96–112)
Creatinine, Ser: 1.05 mg/dL (ref 0.40–1.20)
GFR: 52.58 mL/min — ABNORMAL LOW
Glucose, Bld: 108 mg/dL — ABNORMAL HIGH (ref 70–99)
Potassium: 3.5 meq/L (ref 3.5–5.1)
Sodium: 137 meq/L (ref 135–145)

## 2021-08-01 LAB — MAGNESIUM: Magnesium: 1.6 mg/dL (ref 1.5–2.5)

## 2021-08-01 LAB — HEMOGLOBIN A1C: Hgb A1c MFr Bld: 6.2 % (ref 4.6–6.5)

## 2021-08-01 MED ORDER — GABAPENTIN 300 MG PO CAPS: 300.0000 mg | ORAL_CAPSULE | Freq: Every day | ORAL | 3 refills | Status: DC

## 2021-08-01 MED ORDER — OMEPRAZOLE 20 MG PO CPDR: 20.0000 mg | DELAYED_RELEASE_CAPSULE | Freq: Every day | ORAL | 3 refills | Status: AC

## 2021-08-01 MED ORDER — AMLODIPINE BESYLATE 2.5 MG PO TABS: 2.5000 mg | ORAL_TABLET | Freq: Every day | ORAL | 3 refills | Status: DC

## 2021-08-01 MED ORDER — TRAMADOL HCL 50 MG PO TABS: ORAL_TABLET | ORAL | 5 refills | Status: DC

## 2021-08-01 MED ORDER — TRIAMTERENE-HCTZ 37.5-25 MG PO TABS: 1.0000 | ORAL_TABLET | ORAL | 3 refills | Status: DC

## 2021-08-01 MED ORDER — POTASSIUM CHLORIDE ER 10 MEQ PO CPCR: 30.0000 meq | ORAL_CAPSULE | ORAL | 3 refills | Status: DC

## 2021-08-01 MED ORDER — METHOCARBAMOL 750 MG PO TABS: 750.0000 mg | ORAL_TABLET | Freq: Every day | ORAL | 3 refills | Status: DC

## 2021-08-01 MED ORDER — EZETIMIBE 10 MG PO TABS: 10.0000 mg | ORAL_TABLET | Freq: Every day | ORAL | 3 refills | Status: AC

## 2021-08-01 MED ORDER — ATORVASTATIN CALCIUM 40 MG PO TABS: 40.0000 mg | ORAL_TABLET | Freq: Every day | ORAL | 3 refills | Status: DC

## 2021-08-01 NOTE — Progress Notes (Addendum)
Chief Complaint  Patient presents with   Follow-up    6 mon, disc about tx for rectal cancer, causing her to breakout due to chemo pill.   F/u  1. Rectal cancer on xeloda 1500 mg bid bid gave her a rash and has 10 RT left will do 4 this week, 5 next week and 1 last will be 08/14/21 will have f/u PET/CT scans with h/o and rad on Using [place] oint to rash on arms and helping  She does have diarrhea 3x daily at times due to treatment, n/v, low appetite but doing premier protein shakes She is taking 3 K pills daily due to low K  2. Htn controlled norvasc 2.5 mg qd to bid and maxzide 37.5-25 mg qd    Review of Systems  Constitutional:  Positive for weight loss.  HENT:  Negative for hearing loss.   Eyes:  Negative for blurred vision.  Respiratory:  Negative for shortness of breath.   Cardiovascular:  Negative for chest pain.  Gastrointestinal:  Positive for diarrhea, nausea and vomiting. Negative for abdominal pain and blood in stool.       Low appetite  Genitourinary:  Negative for dysuria.  Musculoskeletal:  Negative for falls and joint pain.  Skin:  Negative for rash.  Neurological:  Negative for headaches.  Psychiatric/Behavioral:  Negative for depression.   Past Medical History:  Diagnosis Date   Arthritis    back knees, shoulders   Basal cell carcinoma    right temple Dr. Elmer Ramp removed 01/2017    Chicken pox    as child   Chronic back pain    COVID-19 10/13/2019   scratchy throat chills fever x 6 weeks x 6 weeks all symptoms resolved   Family history of bladder cancer    Family history of breast cancer    Family history of lung cancer    Family history of stomach cancer    Fatty liver    09/19/15   Gastric ulcer    neg H pylori  diet controlled   GERD (gastroesophageal reflux disease)    Gout    Hiatal hernia    SMALL   History of kidney stones    Hyperlipidemia    Hypertension    Lumbar herniated disc    Rectal cancer ([place])    Recurrent UTI    last uti 6  months ago per pt on 05-23-2021   Rosacea    Syncope none since 2018 per pt    per pt presyncope no LOC felt dizzy/vision distorted and couldnt speak EEG neg, CT head neg 07/31/16, f/u [facility] cardiology with US carotid, echo but no Holter    Umbilical hernia    noted 09/19/15    UTI (urinary tract infection)    Vitamin B 12 deficiency    Vitamin D deficiency    Wears glasses    Past Surgical History:  Procedure Laterality Date   BREAST BIOPSY Right 1980   cyst removed negative pathology    colonscopy  04/17/2021   at [place] Left 02/18/2020   Procedure: CYSTOSCOPY WITH RETROGRADE PYELOGRAM;  Surgeon: Royston Cowper, MD;  Location: [facility];  Service: Urology;  Laterality: Left;   EXTRACORPOREAL SHOCK WAVE LITHOTRIPSY Left 09/19/2017   Procedure: EXTRACORPOREAL SHOCK WAVE LITHOTRIPSY (ESWL);  Surgeon: Royston Cowper, MD;  Location: [facility];  Service: Urology;  Laterality: Left;   JOINT REPLACEMENT     right knee 11/2015 Dr. Janell Quiet [facility]  [place]   RECTAL EXAM UNDER ANESTHESIA N/A 05/26/2021   Procedure: ANORECTAL EXAM UNDER ANESTHESIA;  Surgeon: Ileana Roup, MD;  Location: [place];  Service: General;  Laterality: N/A;   TRANSANAL EXCISION OF RECTAL MASS N/A 05/26/2021   Procedure: TRANSANAL EXCISION OF RIGHT ANTERIOR LATERAL DISTAL RECTAL CANCER;  Surgeon: Ileana Roup, MD;  Location: [place];  Service: General;  Laterality: N/A;   Family History  Problem Relation Age of Onset   Stroke Mother    Breast cancer Mother 83   Cancer Mother        breast cancer s/p masectomy lived to be 62    Hypertension Mother    Cancer Father        NMSC skin cancer    Gout Father    Heart disease Father        s/p bypass surgery age 23 lived 50 months after surgery    Kidney disease Father        RAS>kidney failure    Bladder Cancer Brother    Hyperlipidemia Paternal Aunt    Stroke Paternal Aunt    Breast  cancer Paternal Aunt    Rectal cancer Paternal Aunt    Kidney cancer Neg Hx    Prolactinoma Neg Hx    Social History   Socioeconomic History   Marital status: Widowed    Spouse name: Not on file   Number of children: Not on file   Years of education: Not on file   Highest education level: Not on file  Occupational History   Not on file  Tobacco Use   Smoking status: Former    Packs/day: 1.00    Years: 25.00    Pack years: 25.00    Types: Cigarettes    Quit date: 02/08/1994    Years since quitting: 27.4   Smokeless tobacco: Never   Tobacco comments:    smoked total 30 years quit 20 years ago from 2019 max 1 ppd no FH lung cancer   Vaping Use   Vaping Use: Never used  Substance and Sexual Activity   Alcohol use: No   Drug use: Never   Sexual activity: Not Currently  Other Topics Concern   Not on file  Social History Narrative   2 years college    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   Social Determinants of Radio broadcast assistant Strain: Low Risk    Difficulty of Paying Living Expenses: Not hard at all  Food Insecurity: No Food Insecurity   Worried About Charity fundraiser in the Last Year: Never true   Arboriculturist in the Last Year: Never true  Transportation Needs: No Transportation Needs   Lack of Transportation (Medical): No   Lack of Transportation (Non-Medical): No  Physical Activity: Sufficiently Active   Days of Exercise per Week: 5 days   Minutes of Exercise per Session: 30 min  Stress: No Stress Concern Present   Feeling of Stress : Not at all  Social Connections: Unknown   Frequency of Communication with Friends and Family: More than three times a week   Frequency of Social Gatherings with Friends and Family: More than three times a week   Attends Religious Services: More than 4 times per year   Active Member of [employer] or Organizations: Yes   Attends Archivist Meetings: More than 4 times per year   Marital Status: Not on file  Intimate Partner Violence: Not At Risk   Fear of Current or Ex-Partner: No   Emotionally Abused: No   Physically Abused: No   Sexually Abused: No   Current Meds  Medication Sig   acetaminophen (TYLENOL) 500 MG tablet Take 1,000 mg by mouth 2 (two) times daily.    capecitabine (XELODA) 500 MG tablet Take 4 tabs in morning and 3 tabs in evening, take after meals, take on days of radiation only, Monday through Friday   Cholecalciferol (VITAMIN D3) 5000 units CAPS Take 5,000 Units by mouth daily.    Cyanocobalamin (B-12) 5000 MCG CAPS Take 5,000 mcg by mouth daily.    febuxostat (ULORIC) 40 MG tablet Take 1 tablet (40 mg total) by mouth daily. Hold allopurinol   ketoconazole (NIZORAL) 2 % cream Apply 1 application topically 2 (two) times daily as needed for irritation. FACE   Magnesium 500 MG CAPS Take 500 mg by mouth at bedtime.    omeprazole (PRILOSEC) 20 MG capsule Take 1 capsule (20 mg total) by mouth daily. 30 min before food   omeprazole (PRILOSEC) 20 MG capsule Take 20 mg by mouth daily.   ondansetron (ZOFRAN) 4 MG tablet Take 1-2 tablets (4-8 mg total) by mouth every 8 (eight) hours as needed for nausea or vomiting.   triamcinolone ointment (KENALOG) 0.5 % Apply 1 application. topically 2 (two) times daily.   [DISCONTINUED] amLODipine (NORVASC) 2.5 MG tablet Take 1-2 tablets (2.5-5 mg total) by mouth daily. Start with 2.5 mg dose if BP >130/>80 take 2 pills =5 mg (Patient taking differently: Take 5 mg by mouth daily.)   [DISCONTINUED] atorvastatin (LIPITOR) 40 MG tablet Take 1 tablet (40 mg total) by mouth daily at 6 PM.   [DISCONTINUED] ezetimibe (ZETIA) 10 MG tablet Take 1 tablet (10 mg total) by mouth at bedtime.   [DISCONTINUED] gabapentin (NEURONTIN) 300 MG capsule Take 1 capsule (300 mg total) by mouth at bedtime.   [DISCONTINUED] methocarbamol (ROBAXIN) 750 MG tablet TAKE ONE TABLET BY MOUTH EVERY NIGHT AT BEDTIME AS NEEDED MUSCLE SPASMS (Patient taking differently: at bedtime.)    [DISCONTINUED] pantoprazole (PROTONIX) 40 MG tablet Take 1 tablet (40 mg total) by mouth at bedtime.   [DISCONTINUED] potassium chloride (MICRO-K) 10 MEQ CR capsule Take 2 capsules (20 mEq total) by mouth every morning.   [DISCONTINUED] traMADol (ULTRAM) 50 MG tablet TAKE TWO TABLETS BY MOUTH THREE TIMES A DAY AS NEEDED (Patient taking differently: Takes 1 tab in am , 2 at hs)   [DISCONTINUED] triamterene-hydrochlorothiazide (MAXZIDE-25) 37.5-25 MG tablet Take 1 tablet by mouth every morning.   Allergies  Allergen Reactions   Nortriptyline Nausea Only   Sulfa Antibiotics Rash   Recent Results (from the past 2160 hour(s))  I-STAT, chem 8     Status: Abnormal   Collection Time: 05/26/21  9:48 AM  Result Value Ref Range   Sodium 140 135 - 145 mmol/L   Potassium 3.8 3.5 - 5.1 mmol/L   Chloride 100 98 - 111 mmol/L   BUN 21 8 - 23 mg/dL   Creatinine, Ser 0.80 0.44 - 1.00 mg/dL   Glucose, Bld 117 (H) 70 - 99 mg/dL    Comment: Glucose reference range applies only to samples taken after fasting for at least 8 hours.   Calcium, Ion 1.22 1.15 - 1.40 mmol/L   TCO2 31 22 - 32 mmol/L   Hemoglobin 14.3 12.0 - 15.0 g/dL   HCT 42.0 36.0 - 46.0 %  Surgical pathology  Status: None   Collection Time: 05/26/21 12:05 PM  Result Value Ref Range   SURGICAL PATHOLOGY      SURGICAL PATHOLOGY THIS IS AN ADDENDUM REPORT  CASE: [id number] PATIENT: [facility] Surgical Pathology Report Addendum   Reason for Addendum #1:  DNA Mismatch Repair IHC Results  Clinical History: Anorectal cancer ([facility])     FINAL MICROSCOPIC DIAGNOSIS:  A. RECTUM, RIGHT ANTERIOR LATERAL DISTAL, RESECTION: - Invasive moderately differentiated adenocarcinoma, 2.5 cm - Carcinoma focally invades into superficial muscularis propria - Resection margins are negative for carcinoma - Lateral mucosal margins are negative for dysplasia - No evidence of lymphovascular invasion - See oncology table      ONCOLOGY  TABLE:   COLON AND RECTUM, CARCINOMA:  Resection, Including Transanal Disk Excision of Rectal Neoplasms  Procedure: Resection, distal rectum Tumor Site: Distal rectum Tumor Size: 2.5 cm Macroscopic Tumor Perforation: Not identified Histologic Type: Adenocarcinoma Histologic Grade: G2: Moderately differentiated Multiple  Primary Sites: Not applicable Tumor Extension: Carcinoma focally invades into superficial muscularis propria Lymphovascular Invasion: Not identified Perineural Invasion: Not identified Treatment Effect: No known presurgical therapy Margins:      Margin Status for Invasive Carcinoma: All margins negative for invasive carcinoma      Distance from Invasive Carcinoma to Radial (Circumferential) Margin (required for rectal           tumors): Negative for carcinoma      Distance from Invasive Carcinoma to Closest Mucosal Margin (relevant and required only for           transanal disc excisions): 0.3 cm from 6-9 o'clock quadrant      Margin Status for Non-Invasive Tumor: All margins negative for high-grade dysplasia / intramucosal           carcinoma and low-grade dysplasia Regional Lymph Nodes: Not applicable (no lymph nodes submitted or found) Tumor Deposits: Not identified Distant Metastasis:      Distant Site(s) Involved: Not applicable Pathologic Stage Classificatio n (pTNM, AJCC 8th Edition): pT2, pN not assigned Ancillary Studies: MMR / MSI testing will be ordered. Representative Tumor Block: A2 Comments: None  (v4.2.0.1)     GROSS DESCRIPTION:  A. Received fresh and subsequently placed in formalin labeled with the patient's name and "Right anterior lateral rectal cancer" is a 2.5 x 2.3 cm circular piece of mucosal tissue with a blue pin marking the proximal position (arbitrarily designated 12:00). A red pin marking the posterior position (3:00), a white pin marking the distal position (6:00), and a green pin marking the anterior position (9:00).  Centrally located on the surface is a 2.5 x 2.3 x 1.5 cm red, rubbery, fungating lesion that comes to within 0.1 cm of 12:00, 0.3 cm of 3:00, 0.2 cm of 6:00, and 0.7 cm from 9:00. The specimen is inked as follows: 12:00 to 3:00 - blue 3:00 to 6:00 - red 6:00 to 9:00 - yellow 9:00 to 12:00 - green Deep - black Sectioning from 12:00 to 6:00 reveals the lesion extends up to 0.2  cm deep, remaining 0.4 cm from the deep margin. The specimen is entirely submitted as follows: A1: 12:00 edge, perpendicular A2-A3: midportion sequentially submitted from 12:00 to 6:00 A4: 6:00 edge, perpendicular *Photo map created as visual aid (LEF 05/26/2021)     Final Diagnosis performed by Jaquita Folds, MD.   Electronically signed 05/29/2021 Technical and / or Professional components performed at [facility], [place] [address], [place], [place] [address].  Immunohistochemistry Technical component (if applicable) was performed  at [facility]. [address], [place], [place], [place] [address].   IMMUNOHISTOCHEMISTRY DISCLAIMER (if applicable): Some of these immunohistochemical stains may have been developed and the performance characteristics determine by [facility]. Some may not have been cleared or approved by the U.S. Food and Drug Administration. The FDA has determined that such clearance or approval i s not necessary. This test is used for clinical purposes. It should not be regarded as investigational or for research. This laboratory is certified under the [place] (CLIA-88) as qualified to perform high complexity clinical laboratory testing.  The controls stained appropriately.  ADDENDUM: Mismatch Repair Protein (IHC)  SUMMARY INTERPRETATION: NORMAL  There is preserved expression of the major MMR proteins. There is a very low probability that microsatellite instability (MSI) is  present. However, certain clinically significant MMR protein mutations may result in preservation of nuclear expression. It is recommended that the preservation of protein expression be correlated with molecular based MSI testing.  IHC EXPRESSION RESULTS TEST           RESULT MLH1:          Preserved nuclear expression MSH2:          Preserved nuclear expression MSH6:          Preserved nuclear expression PMS2:          Preserved nuclear expre ssion  References: 1. Guidelines on Genetic Evaluation and Management of Lynch Syndrome: A Consensus Statement by the [place] Multi-Society Task Force on Colorectal Cancer Gae Dry. Sherlie Ban , MD, and other . Am Nicki [place] 2014; [phone]; doi: 10.1038/ajg.2014.186; published online 23 September 2012 2. Outcomes of screening endometrial cancer patients for Lynch syndrome by patient-administered checklist. Olena Heckle MS, and others. Gynecol Oncol 2013;131(3):619-623. 3. Muir-Torre syndrome (MTS): An update and approach to diagnosis and management. Shelly Flatten, MD and others. J Am Acad Dermatol [phone]  Addendum #1 performed by Claudette Laws, MD.   Electronically signed 05/30/2021 Technical and / or Professional components performed at [facility], [place] [address], [place], [place] [address].  Immunohistochemistry Technical component (if applicable) was performed at [facility]. [address], [address], [place], [place] [address].   IMMUNOHISTOCHEMISTRY DISCLAIMER (if applicable): Some of these immunohistochemical stains may have been developed and the performance characteristics determine by [facility]. Some may not have been cleared or approved by the U.S. Food and Drug Administration. The FDA has determined that such clearance or approval is not necessary. This test is used for clinical purposes. It should not be regarded as investigational or for research. This laboratory  is certified under the [place] (CLIA-88) as qualified to perform high complexity clinical laboratory testing.  The controls stained appropriately.   CEA (IN HOUSE-[facility])FOR [facility] ONLY     Status: None   Collection Time: 07/03/21  9:45 AM  Result Value Ref Range   CEA ([facility]-In House) 1.38 0.00 - 5.00 ng/mL    Comment: (NOTE) This test was performed using Architect's Chemiluminescent Microparticle Immunoassay. Values obtained from different assay methods cannot be used interchangeably. Please note that 5-10% of patients who smoke may see CEA levels up to 6.9 ng/mL. Performed at [facility], [address], [place], [place] [address]   [place] ([place] only)     Status: Abnormal   Collection Time: 07/03/21  9:45 AM  Result Value Ref Range   Sodium 137 135 - 145 mmol/L  Potassium 3.9 3.5 - 5.1 mmol/L   Chloride 101 98 - 111 mmol/L   CO2 29 22 - 32 mmol/L   Glucose, Bld 122 (H) 70 - 99 mg/dL    Comment: Glucose reference range applies only to samples taken after fasting for at least 8 hours.   BUN 22 8 - 23 mg/dL   Creatinine 1.03 (H) 0.44 - 1.00 mg/dL   Calcium 9.8 8.9 - 10.3 mg/dL   Total Protein 7.1 6.5 - 8.1 g/dL   Albumin 4.4 3.5 - 5.0 g/dL   AST 16 15 - 41 U/L   ALT 22 0 - 44 U/L   Alkaline Phosphatase 96 38 - 126 U/L   Total Bilirubin 0.7 0.3 - 1.2 mg/dL   GFR, Estimated 57 (L) >60 mL/min    Comment: (NOTE) Calculated using the CKD-EPI Creatinine Equation (2021)    Anion gap 7 5 - 15    Comment: Performed at [facility], [place] [address], [place], [place] [address]  CBC with Differential ([place] Only)     Status: None   Collection Time: 07/03/21  9:45 AM  Result Value Ref Range   WBC Count 7.6 4.0 - 10.5 K/uL   RBC 4.28 3.87 - 5.11 MIL/uL   Hemoglobin 13.3 12.0 - 15.0 g/dL   HCT 40.4 36.0 - 46.0 %   MCV 94.4 80.0 - 100.0 fL   MCH 31.1 26.0 - 34.0 pg   MCHC 32.9  30.0 - 36.0 g/dL   RDW 15.1 11.5 - 15.5 %   Platelet Count 228 150 - 400 K/uL   nRBC 0.0 0.0 - 0.2 %   Neutrophils Relative % 60 %   Neutro Abs 4.5 1.7 - 7.7 K/uL   Lymphocytes Relative 31 %   Lymphs Abs 2.4 0.7 - 4.0 K/uL   Monocytes Relative 6 %   Monocytes Absolute 0.5 0.1 - 1.0 K/uL   Eosinophils Relative 3 %   Eosinophils Absolute 0.2 0.0 - 0.5 K/uL   Basophils Relative 0 %   Basophils Absolute 0.0 0.0 - 0.1 K/uL   Immature Granulocytes 0 %   Abs Immature Granulocytes 0.01 0.00 - 0.07 K/uL    Comment: Performed at [facility], [place] [address] [place], [place] [address]  Rad Sandria Senter Session Summary     Status: None   Collection Time: 07/03/21 10:59 AM  Result Value Ref Range   Course ID [email or web address]    Course Start Date 06/22/2021    Session Number 1    Course First Treatment Date 07/03/2021 10:53 AM    Course Last Treatment Date 07/03/2021 10:58 AM    Course Elapsed Days 0    Reference Point ID Rectum DP    Reference Point Dosage Given to Date 1.8 Gy   Reference Point Session Dosage Given 1.8 Gy   Plan ID Rectum    Plan Name Rectum    Plan Fractions Treated to Date 1    Plan Total Fractions Prescribed 25    Plan Prescribed Dose Per Fraction 1.8 Gy   Plan Total Prescribed Dose 45.000000 Gy   Plan Primary Reference Point Rectum DP   Rad Onc Aria Session Summary     Status: None   Collection Time: 07/04/21  4:09 PM  Result Value Ref Range   Course ID [email or web address]    Course Start Date 06/22/2021    Session Number 2    Course First Treatment Date 07/03/2021 10:53 AM  Course Last Treatment Date 07/04/2021  4:08 PM    Course Elapsed Days 1    Reference Point ID Rectum DP    Reference Point Dosage Given to Date 3.6 Gy   Reference Point Session Dosage Given 1.8 Gy   Plan ID Rectum    Plan Name Rectum    Plan Fractions Treated to Date 2    Plan Total Fractions Prescribed 25    Plan Prescribed Dose Per Fraction 1.8 Gy   Plan Total Prescribed Dose 45.000000  Gy   Plan Primary Reference Point Rectum DP   Rad Onc Aria Session Summary     Status: None   Collection Time: 07/05/21  3:16 PM  Result Value Ref Range   Course ID [email or web address]    Course Start Date 06/22/2021    Session Number 3    Course First Treatment Date 07/03/2021 10:53 AM    Course Last Treatment Date 07/05/2021  3:15 PM    Course Elapsed Days 2    Reference Point ID Rectum DP    Reference Point Dosage Given to Date 5.4 Gy   Reference Point Session Dosage Given 1.8 Gy   Plan ID Rectum    Plan Name Rectum    Plan Fractions Treated to Date 3    Plan Total Fractions Prescribed 25    Plan Prescribed Dose Per Fraction 1.8 Gy   Plan Total Prescribed Dose 45.000000 Gy   Plan Primary Reference Point Rectum DP   Rad Onc Aria Session Summary     Status: None   Collection Time: 07/06/21  2:55 PM  Result Value Ref Range   Course ID [email or web address]    Course Start Date 06/22/2021    Session Number 4    Course First Treatment Date 07/03/2021 10:53 AM    Course Last Treatment Date 07/06/2021  2:54 PM    Course Elapsed Days 3    Reference Point ID Rectum DP    Reference Point Dosage Given to Date 7.2 Gy   Reference Point Session Dosage Given 1.8 Gy   Plan ID Rectum    Plan Name Rectum    Plan Fractions Treated to Date 4    Plan Total Fractions Prescribed 25    Plan Prescribed Dose Per Fraction 1.8 Gy   Plan Total Prescribed Dose 45.000000 Gy   Plan Primary Reference Point Rectum DP   Rad Onc Aria Session Summary     Status: None   Collection Time: 07/07/21  3:06 PM  Result Value Ref Range   Course ID [email or web address]    Course Start Date 06/22/2021    Session Number 5    Course First Treatment Date 07/03/2021 10:53 AM    Course Last Treatment Date 07/07/2021  3:05 PM    Course Elapsed Days 4    Reference Point ID Rectum DP    Reference Point Dosage Given to Date 9 Gy   Reference Point Session Dosage Given 1.8 Gy   Plan ID Rectum    Plan Name Rectum    Plan Fractions Treated to Date 5    Plan Total  Fractions Prescribed 25    Plan Prescribed Dose Per Fraction 1.8 Gy   Plan Total Prescribed Dose 45.000000 Gy   Plan Primary Reference Point Rectum DP   CMP ([place] only)     Status: Abnormal   Collection Time: 07/10/21  9:21 AM  Result Value Ref Range   Sodium 138 135 - 145 mmol/L   Potassium 3.5 3.5 -  5.1 mmol/L   Chloride 100 98 - 111 mmol/L   CO2 29 22 - 32 mmol/L   Glucose, Bld 115 (H) 70 - 99 mg/dL    Comment: Glucose reference range applies only to samples taken after fasting for at least 8 hours.   BUN 23 8 - 23 mg/dL   Creatinine 0.99 0.44 - 1.00 mg/dL   Calcium 10.0 8.9 - 10.3 mg/dL   Total Protein 7.3 6.5 - 8.1 g/dL   Albumin 4.4 3.5 - 5.0 g/dL   AST 16 15 - 41 U/L   ALT 22 0 - 44 U/L   Alkaline Phosphatase 91 38 - 126 U/L   Total Bilirubin 0.9 0.3 - 1.2 mg/dL   GFR, Estimated >60 >60 mL/min    Comment: (NOTE) Calculated using the CKD-EPI Creatinine Equation (2021)    Anion gap 9 5 - 15    Comment: Performed at [facility], [place] [address] [place], [place] [address]  CBC with Differential ([place] Only)     Status: None   Collection Time: 07/10/21  9:21 AM  Result Value Ref Range   WBC Count 6.0 4.0 - 10.5 K/uL   RBC 4.32 3.87 - 5.11 MIL/uL   Hemoglobin 13.4 12.0 - 15.0 g/dL   HCT 40.7 36.0 - 46.0 %   MCV 94.2 80.0 - 100.0 fL   MCH 31.0 26.0 - 34.0 pg   MCHC 32.9 30.0 - 36.0 g/dL   RDW 14.6 11.5 - 15.5 %   Platelet Count 244 150 - 400 K/uL   nRBC 0.0 0.0 - 0.2 %   Neutrophils Relative % 63 %   Neutro Abs 3.8 1.7 - 7.7 K/uL   Lymphocytes Relative 29 %   Lymphs Abs 1.7 0.7 - 4.0 K/uL   Monocytes Relative 6 %   Monocytes Absolute 0.3 0.1 - 1.0 K/uL   Eosinophils Relative 2 %   Eosinophils Absolute 0.1 0.0 - 0.5 K/uL   Basophils Relative 0 %   Basophils Absolute 0.0 0.0 - 0.1 K/uL   Immature Granulocytes 0 %   Abs Immature Granulocytes 0.02 0.00 - 0.07 K/uL    Comment: Performed at [facility],  [place] [address] [place], [place] [address]  Rad Sandria Senter Session Summary     Status: None   Collection Time: 07/10/21 10:06 AM  Result Value Ref Range   Course ID [email or web address]    Course Start Date 06/22/2021    Session Number 6    Course First Treatment Date 07/03/2021 10:53 AM    Course Last Treatment Date 07/10/2021 10:05 AM    Course Elapsed Days 7    Reference Point ID Rectum DP    Reference Point Dosage Given to Date 10.8 Gy   Reference Point Session Dosage Given 1.8 Gy   Plan ID Rectum    Plan Name Rectum    Plan Fractions Treated to Date 6    Plan Total Fractions Prescribed 25    Plan Prescribed Dose Per Fraction 1.8 Gy   Plan Total Prescribed Dose 45.000000 Gy   Plan Primary Reference Point Rectum DP   Rad Onc Aria Session Summary     Status: None   Collection Time: 07/11/21  3:00 PM  Result Value Ref Range   Course ID [email or web address]    Course Start Date 06/22/2021    Session Number 7    Course First Treatment Date 07/03/2021 10:53 AM    Course Last Treatment Date 07/11/2021  2:59 PM    Course Elapsed Days 8    Reference Point ID Rectum DP    Reference Point Dosage Given to Date 12.6 Gy   Reference Point Session Dosage Given 1.8 Gy   Plan ID Rectum    Plan Name Rectum    Plan Fractions Treated to Date 7    Plan Total Fractions Prescribed 25    Plan Prescribed Dose Per Fraction 1.8 Gy   Plan Total Prescribed Dose 45.000000 Gy   Plan Primary Reference Point Rectum DP   Rad Onc Aria Session Summary     Status: None   Collection Time: 07/12/21  3:00 PM  Result Value Ref Range   Course ID [email or web address]    Course Start Date 06/22/2021    Session Number 8    Course First Treatment Date 07/03/2021 10:53 AM    Course Last Treatment Date 07/12/2021  2:59 PM    Course Elapsed Days 9    Reference Point ID Rectum DP    Reference Point Dosage Given to Date 14.4 Gy   Reference Point Session Dosage Given 1.8 Gy   Plan ID Rectum    Plan Name Rectum    Plan Fractions Treated to Date 8    Plan  Total Fractions Prescribed 25    Plan Prescribed Dose Per Fraction 1.8 Gy   Plan Total Prescribed Dose 45.000000 Gy   Plan Primary Reference Point Rectum DP   Rad Onc Aria Session Summary     Status: None   Collection Time: 07/13/21  3:29 PM  Result Value Ref Range   Course ID [email or web address]    Course Start Date 06/22/2021    Session Number 9    Course First Treatment Date 07/03/2021 10:53 AM    Course Last Treatment Date 07/13/2021  3:28 PM    Course Elapsed Days 10    Reference Point ID Rectum DP    Reference Point Dosage Given to Date 16.2 Gy   Reference Point Session Dosage Given 1.8 Gy   Plan ID Rectum    Plan Name Rectum    Plan Fractions Treated to Date 9    Plan Total Fractions Prescribed 25    Plan Prescribed Dose Per Fraction 1.8 Gy   Plan Total Prescribed Dose 45.000000 Gy   Plan Primary Reference Point Rectum DP   Rad Onc Aria Session Summary     Status: None   Collection Time: 07/14/21  3:02 PM  Result Value Ref Range   Course ID [email or web address]    Course Start Date 06/22/2021    Session Number 10    Course First Treatment Date 07/03/2021 10:53 AM    Course Last Treatment Date 07/14/2021  3:01 PM    Course Elapsed Days 11    Reference Point ID Rectum DP    Reference Point Dosage Given to Date 18 Gy   Reference Point Session Dosage Given 1.8 Gy   Plan ID Rectum    Plan Name Rectum    Plan Fractions Treated to Date 10    Plan Total Fractions Prescribed 25    Plan Prescribed Dose Per Fraction 1.8 Gy   Plan Total Prescribed Dose 45.000000 Gy   Plan Primary Reference Point Rectum DP   CMP ([place] only)     Status: Abnormal   Collection Time: 07/17/21 10:28 AM  Result Value Ref Range   Sodium 138 135 - 145 mmol/L   Potassium 3.5 3.5 - 5.1 mmol/L   Chloride 98 98 -  111 mmol/L   CO2 32 22 - 32 mmol/L   Glucose, Bld 113 (H) 70 - 99 mg/dL    Comment: Glucose reference range applies only to samples taken after fasting for at least 8 hours.   BUN 18 8 - 23 mg/dL   Creatinine  0.89 0.44 - 1.00 mg/dL   Calcium 9.8 8.9 - 10.3 mg/dL   Total Protein 7.0 6.5 - 8.1 g/dL   Albumin 4.3 3.5 - 5.0 g/dL   AST 18 15 - 41 U/L   ALT 22 0 - 44 U/L   Alkaline Phosphatase 74 38 - 126 U/L   Total Bilirubin 0.9 0.3 - 1.2 mg/dL   GFR, Estimated >60 >60 mL/min    Comment: (NOTE) Calculated using the CKD-EPI Creatinine Equation (2021)    Anion gap 8 5 - 15    Comment: Performed at [facility], [place] [address], [place], [place] [address]  CBC with Differential ([place] Only)     Status: None   Collection Time: 07/17/21 10:28 AM  Result Value Ref Range   WBC Count 5.7 4.0 - 10.5 K/uL   RBC 3.89 3.87 - 5.11 MIL/uL   Hemoglobin 12.5 12.0 - 15.0 g/dL   HCT 36.9 36.0 - 46.0 %   MCV 94.9 80.0 - 100.0 fL   MCH 32.1 26.0 - 34.0 pg   MCHC 33.9 30.0 - 36.0 g/dL   RDW 15.5 11.5 - 15.5 %   Platelet Count 193 150 - 400 K/uL   nRBC 0.0 0.0 - 0.2 %   Neutrophils Relative % 70 %   Neutro Abs 4.0 1.7 - 7.7 K/uL   Lymphocytes Relative 20 %   Lymphs Abs 1.2 0.7 - 4.0 K/uL   Monocytes Relative 6 %   Monocytes Absolute 0.4 0.1 - 1.0 K/uL   Eosinophils Relative 4 %   Eosinophils Absolute 0.2 0.0 - 0.5 K/uL   Basophils Relative 0 %   Basophils Absolute 0.0 0.0 - 0.1 K/uL   Immature Granulocytes 0 %   Abs Immature Granulocytes 0.01 0.00 - 0.07 K/uL    Comment: Performed at [facility], [place] [address] [place], [place] [address]  Rad Sandria Senter Session Summary     Status: None   Collection Time: 07/17/21 11:23 AM  Result Value Ref Range   Course ID [email or web address]    Course Start Date 06/22/2021    Session Number 11    Course First Treatment Date 07/03/2021 10:53 AM    Course Last Treatment Date 07/17/2021 11:22 AM    Course Elapsed Days 14    Reference Point ID Rectum DP    Reference Point Dosage Given to Date 19.8 Gy   Reference Point Session Dosage Given 1.8 Gy   Plan ID Rectum    Plan Name Rectum    Plan Fractions Treated to Date 11     Plan Total Fractions Prescribed 25    Plan Prescribed Dose Per Fraction 1.8 Gy   Plan Total Prescribed Dose 45.000000 Gy   Plan Primary Reference Point Rectum DP   Rad Onc Aria Session Summary     Status: None   Collection Time: 07/18/21  1:32 PM  Result Value Ref Range   Course ID [email or web address]    Course Start Date 06/22/2021    Session Number 12    Course First Treatment Date 07/03/2021 10:53 AM    Course Last Treatment Date 07/18/2021  1:32 PM    Course Elapsed  Days 15    Reference Point ID Rectum DP    Reference Point Dosage Given to Date 21.6 Gy   Reference Point Session Dosage Given 1.8 Gy   Plan ID Rectum    Plan Name Rectum    Plan Fractions Treated to Date 12    Plan Total Fractions Prescribed 25    Plan Prescribed Dose Per Fraction 1.8 Gy   Plan Total Prescribed Dose 45.000000 Gy   Plan Primary Reference Point Rectum DP   Rad Onc Aria Session Summary     Status: None   Collection Time: 07/19/21  3:34 PM  Result Value Ref Range   Course ID [email or web address]    Course Start Date 06/22/2021    Session Number 13    Course First Treatment Date 07/03/2021 10:53 AM    Course Last Treatment Date 07/19/2021  3:33 PM    Course Elapsed Days 16    Reference Point ID Rectum DP    Reference Point Dosage Given to Date 23.4 Gy   Reference Point Session Dosage Given 1.8 Gy   Plan ID Rectum    Plan Name Rectum    Plan Fractions Treated to Date 65    Plan Total Fractions Prescribed 25    Plan Prescribed Dose Per Fraction 1.8 Gy   Plan Total Prescribed Dose 45.000000 Gy   Plan Primary Reference Point Rectum DP   Rad Onc Aria Session Summary     Status: None   Collection Time: 07/20/21  2:01 PM  Result Value Ref Range   Course ID [email or web address]    Course Start Date 06/22/2021    Session Number 14    Course First Treatment Date 07/03/2021 10:53 AM    Course Last Treatment Date 07/20/2021  2:00 PM    Course Elapsed Days 17    Reference Point ID Rectum DP    Reference Point Dosage Given to Date 25.2 Gy    Reference Point Session Dosage Given 1.8 Gy   Plan ID Rectum    Plan Name Rectum    Plan Fractions Treated to Date 14    Plan Total Fractions Prescribed 25    Plan Prescribed Dose Per Fraction 1.8 Gy   Plan Total Prescribed Dose 45.000000 Gy   Plan Primary Reference Point Rectum DP   Rad Onc Aria Session Summary     Status: None   Collection Time: 07/21/21  3:14 PM  Result Value Ref Range   Course ID [email or web address]    Course Start Date 06/22/2021    Session Number 15    Course First Treatment Date 07/03/2021 10:53 AM    Course Last Treatment Date 07/21/2021  3:13 PM    Course Elapsed Days 18    Reference Point ID Rectum DP    Reference Point Dosage Given to Date 27 Gy   Reference Point Session Dosage Given 1.8 Gy   Plan ID Rectum    Plan Name Rectum    Plan Fractions Treated to Date 15    Plan Total Fractions Prescribed 25    Plan Prescribed Dose Per Fraction 1.8 Gy   Plan Total Prescribed Dose 45.000000 Gy   Plan Primary Reference Point Rectum DP   CMP ([place] only)     Status: Abnormal   Collection Time: 07/24/21 11:38 AM  Result Value Ref Range   Sodium 137 135 - 145 mmol/L   Potassium 3.3 (L) 3.5 - 5.1 mmol/L   Chloride 97 (L) 98 - 111 mmol/L   CO2  32 22 - 32 mmol/L   Glucose, Bld 126 (H) 70 - 99 mg/dL    Comment: Glucose reference range applies only to samples taken after fasting for at least 8 hours.   BUN 19 8 - 23 mg/dL   Creatinine 0.95 0.44 - 1.00 mg/dL   Calcium 9.9 8.9 - 10.3 mg/dL   Total Protein 7.2 6.5 - 8.1 g/dL   Albumin 4.3 3.5 - 5.0 g/dL   AST 17 15 - 41 U/L   ALT 21 0 - 44 U/L   Alkaline Phosphatase 78 38 - 126 U/L   Total Bilirubin 0.8 0.3 - 1.2 mg/dL   GFR, Estimated >60 >60 mL/min    Comment: (NOTE) Calculated using the CKD-EPI Creatinine Equation (2021)    Anion gap 8 5 - 15    Comment: Performed at [facility], [place] [address], [place], [place] [address]  CBC with Differential ([place] Only)     Status:  Abnormal   Collection Time: 07/24/21 11:38 AM  Result Value Ref Range   WBC Count 6.2 4.0 - 10.5 K/uL   RBC 3.78 (L) 3.87 - 5.11 MIL/uL   Hemoglobin 12.3 12.0 - 15.0 g/dL   HCT 36.0 36.0 - 46.0 %   MCV 95.2 80.0 - 100.0 fL   MCH 32.5 26.0 - 34.0 pg   MCHC 34.2 30.0 - 36.0 g/dL   RDW 16.4 (H) 11.5 - 15.5 %   Platelet Count 223 150 - 400 K/uL   nRBC 0.0 0.0 - 0.2 %   Neutrophils Relative % 75 %   Neutro Abs 4.7 1.7 - 7.7 K/uL   Lymphocytes Relative 14 %   Lymphs Abs 0.9 0.7 - 4.0 K/uL   Monocytes Relative 7 %   Monocytes Absolute 0.4 0.1 - 1.0 K/uL   Eosinophils Relative 3 %   Eosinophils Absolute 0.2 0.0 - 0.5 K/uL   Basophils Relative 0 %   Basophils Absolute 0.0 0.0 - 0.1 K/uL   Immature Granulocytes 1 %   Abs Immature Granulocytes 0.03 0.00 - 0.07 K/uL    Comment: Performed at [facility], [place] [address] [place], [place] [address]  Rad Sandria Senter Session Summary     Status: None   Collection Time: 07/24/21  1:50 PM  Result Value Ref Range   Course ID [email or web address]    Course Start Date 06/22/2021    Session Number 16    Course First Treatment Date 07/03/2021 10:53 AM    Course Last Treatment Date 07/24/2021  1:49 PM    Course Elapsed Days 21    Reference Point ID Rectum DP    Reference Point Dosage Given to Date 28.8 Gy   Reference Point Session Dosage Given 1.8 Gy   Plan ID Rectum    Plan Name Rectum    Plan Fractions Treated to Date 16    Plan Total Fractions Prescribed 25    Plan Prescribed Dose Per Fraction 1.8 Gy   Plan Total Prescribed Dose 45.000000 Gy   Plan Primary Reference Point Rectum DP   Rad Onc Aria Session Summary     Status: None   Collection Time: 07/25/21  2:54 PM  Result Value Ref Range   Course ID [email or web address]    Course Start Date 06/22/2021    Session Number 17    Course First Treatment Date 07/03/2021 10:53 AM    Course Last Treatment Date 07/25/2021  2:53 PM    Course Elapsed Days 22  Reference Point ID Rectum DP    Reference  Point Dosage Given to Date 30.6 Gy   Reference Point Session Dosage Given 1.8 Gy   Plan ID Rectum    Plan Name Rectum    Plan Fractions Treated to Date 17    Plan Total Fractions Prescribed 25    Plan Prescribed Dose Per Fraction 1.8 Gy   Plan Total Prescribed Dose 45.000000 Gy   Plan Primary Reference Point Rectum DP   Rad Onc Aria Session Summary     Status: None   Collection Time: 07/26/21  3:20 PM  Result Value Ref Range   Course ID [email or web address]    Course Start Date 06/22/2021    Session Number 18    Course First Treatment Date 07/03/2021 10:53 AM    Course Last Treatment Date 07/26/2021  3:19 PM    Course Elapsed Days 23    Reference Point ID Rectum DP    Reference Point Dosage Given to Date 32.4 Gy   Reference Point Session Dosage Given 1.8 Gy   Plan ID Rectum    Plan Name Rectum    Plan Fractions Treated to Date 55    Plan Total Fractions Prescribed 25    Plan Prescribed Dose Per Fraction 1.8 Gy   Plan Total Prescribed Dose 45.000000 Gy   Plan Primary Reference Point Rectum DP   Rad Onc Aria Session Summary     Status: None   Collection Time: 07/27/21  2:11 PM  Result Value Ref Range   Course ID [email or web address]    Course Start Date 06/22/2021    Session Number 19    Course First Treatment Date 07/03/2021 10:53 AM    Course Last Treatment Date 07/27/2021  2:10 PM    Course Elapsed Days 24    Reference Point ID Rectum DP    Reference Point Dosage Given to Date 34.2 Gy   Reference Point Session Dosage Given 1.8 Gy   Plan ID Rectum    Plan Name Rectum    Plan Fractions Treated to Date 66    Plan Total Fractions Prescribed 25    Plan Prescribed Dose Per Fraction 1.8 Gy   Plan Total Prescribed Dose 45.000000 Gy   Plan Primary Reference Point Rectum DP   Rad Onc Aria Session Summary     Status: None   Collection Time: 07/28/21  2:43 PM  Result Value Ref Range   Course ID [email or web address]    Course Start Date 06/22/2021    Session Number 20    Course First Treatment Date 07/03/2021 10:53 AM     Course Last Treatment Date 07/28/2021  2:42 PM    Course Elapsed Days 25    Reference Point ID Rectum DP    Reference Point Dosage Given to Date 36 Gy   Reference Point Session Dosage Given 1.8 Gy   Plan ID Rectum    Plan Name Rectum    Plan Fractions Treated to Date 20    Plan Total Fractions Prescribed 25    Plan Prescribed Dose Per Fraction 1.8 Gy   Plan Total Prescribed Dose 45.000000 Gy   Plan Primary Reference Point Rectum DP    Objective  Body mass index is 34.41 kg/m. Wt Readings from Last 3 Encounters:  08/01/21 213 lb 3.2 oz (96.7 kg)  07/24/21 216 lb 1.6 oz (98 kg)  07/10/21 218 lb 12.8 oz (99.2 kg)   Temp Readings from Last 3 Encounters:  08/01/21 98.2 F (36.8 C) (Oral)  07/24/21 98.5 F (36.9 C) (Oral)  07/10/21 98.5 F (36.9 C) (Oral)   BP Readings from Last 3 Encounters:  08/01/21 130/70  07/24/21 (!) 144/59  07/10/21 (!) 145/69   Pulse Readings from Last 3 Encounters:  08/01/21 82  07/24/21 82  07/10/21 88    Physical Exam Vitals and nursing note reviewed.  Constitutional:      Appearance: Normal appearance. She is well-developed and well-groomed.  HENT:     Head: Normocephalic and atraumatic.  Eyes:     Conjunctiva/sclera: Conjunctivae normal.     Pupils: Pupils are equal, round, and reactive to light.  Cardiovascular:     Rate and Rhythm: Normal rate and regular rhythm.     Heart sounds: Normal heart sounds. No murmur heard. Pulmonary:     Effort: Pulmonary effort is normal.     Breath sounds: Normal breath sounds.  Abdominal:     General: Abdomen is flat. Bowel sounds are normal.     Tenderness: There is no abdominal tenderness.  Musculoskeletal:        General: No tenderness.  Skin:    General: Skin is warm and dry.  Neurological:     General: No focal deficit present.     Mental Status: She is alert and oriented to person, place, and time. Mental status is at baseline.     Cranial Nerves: Cranial nerves 2-12 are intact.      Gait: Gait is intact.  Psychiatric:        Attention and Perception: Attention and perception normal.        Mood and Affect: Mood and affect normal.        Speech: Speech normal.        Behavior: Behavior normal. Behavior is cooperative.        Thought Content: Thought content normal.        Cognition and Memory: Cognition and memory normal.        Judgment: Judgment normal.    Assessment  Plan  Rectal cancer ([place]) s/p resectoin RT x 10 more to go will have 4 this week, 5 next week and 1 more finish 08/14/21 on xeloda 1500 mg bid per Dr. Burr Medico f/u h/o as well  Rash TMC Diarrhea prn immodium  Hydration, brat diet   Gastroesophageal reflux disease without esophagitis - Plan: omeprazole (PRILOSEC) 20 MG capsule  Primary hypertension Norvasc 5 mg qd and maxzide 37.5-25 mg qd   Hypomagnesemia - Plan: Magnesium, Basic Metabolic Panel (BMET)  Acute cystitis without hematuria, recurrent - Plan: Urinalysis, Routine w reflex microscopic, Urine Culture  Hypokalemia - Plan: Magnesium, Basic Metabolic Panel (BMET), potassium chloride (MICRO-K) 30 MEQ CR capsule Given high K food list   Prediabetes - Plan: Hemoglobin A1c  Essential hypertension - Plan: triamterene-hydrochlorothiazide (MAXZIDE-25) 37.5-25 MG tablet, amLODipine (NORVASC) 2.5 MG tablet  Lumbar radiculopathy - Plan: traMADol (ULTRAM) 50 MG tablet, methocarbamol (ROBAXIN) 750 MG tablet, gabapentin (NEURONTIN) 300 MG capsule Handicap form given today for placard for car   Lumbar herniated disc - Plan: traMADol (ULTRAM) 50 MG tablet, methocarbamol (ROBAXIN) 750 MG tablet, gabapentin (NEURONTIN) 300 MG capsule  Abnormal MRI, lumbar spine - Plan: traMADol (ULTRAM) 50 MG tablet, gabapentin (NEURONTIN) 300 MG capsule  Pain of both hip joints - Plan: traMADol (ULTRAM) 50 MG tablet  Bilateral low back pain with sciatica, sciatica laterality unspecified, unspecified chronicity - Plan: gabapentin (NEURONTIN) 300 MG  capsule  Hyperlipidemia, unspecified hyperlipidemia type - Plan: atorvastatin (LIPITOR) 40 MG tablet  MRI pelvis  Refer to [facility] in [place] further w/u Thickened endometrium versus in the endometrial canal. Irregularity of lower uterine segment raising the question of narrowing perhaps from prior endometrial ablation or stricture. Gyn consultation may be helpful with follow-up pelvic sonogram and or sampling as warranted.   These results will be called to the ordering clinician or representative by the Radiologist Assistant, and communication documented in the PACS or [employer].     Electronically Signed   By: Zetta Bills M.D.   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 Was due 10/17/17, also had hep A 03/19/17 2nd dose due in 6-12 months h/o fatty liver   -as of 07/20/20 has not had    MMR immune   Colonoscopy 04/19/21 + rectal cancer [facility] Will need yearly colonoscopy, PET/CT Q 6 months  Prev Declined wanted cologuard 09/29/19 + delay in pt scheduling colonoscopy as of 02/17/20, 07/20/20    ? Date of last pap per pt no h/o abnormal pt per chart h/o abnormal pap Dr. Vickki Muff has record. Last I can see 11/21/01 neg pap +BV    Mammogram 11/13/17 2.5 cm cyst right breast f/u in 1 year will need to reorder in future  pt to call  Ordered 2.5 right breast cyst 07/25/20 neg ordered and will schedule 08/14/21  Ordered   DEXA 04/03/12 osteopenia rec calcium 600 mg bid and on vit D3 5000 qd. 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 Kellie Moor not seen for 2021 f/u in 2021 ln2 face, arms, hands  appt upcoming 07/2020 or 08/2020    rec healthy diet and exercise    Urology Dr. Boneta Lucks est appt 10/2020 needs repeat US right kidney cysts    htn controlled on norvasc 5 mg qd and maxzie 37.5-25 mg qd  Provider: Dr. Olivia Mackie McLean-Scocuzza-Internal Medicine

## 2021-08-01 NOTE — Patient Instructions (Addendum)
Let me know if you want larger jar of triamcinolone  Zero sugar gatorade   Bland Diet A bland diet may consist of soft foods or foods that are not high in fat or are not greasy, acidic, or spicy. Avoiding certain foods may cause less irritation to your mouth, throat, stomach, or gastrointestinal tract. Avoiding certain foods may make you feel better. Everyone's tolerances are different. A bland diet should be based on what you can tolerate and what may cause discomfort. What is my plan? Your health care provider or dietitian may recommend specific changes to your diet to treat your symptoms. These changes may include: Eating small meals frequently. Cooking food until it is soft enough to chew easily. Taking the time to chew your food thoroughly, so it is easy to swallow and digest. Avoiding foods that cause you discomfort. These may include spicy food, fried food, greasy foods, hard-to-chew foods, or citrus fruits and juices. Drinking slowly. What are tips for following this plan? Reading food labels To reduce fiber intake, look for food labels that say "whole," such as whole wheat or whole grain. Shopping Avoid food items that may have nuts or seeds. Avoid vegetables that may make you gassy or have a tough texture, such as broccoli, cauliflower, or corn. Cooking Cook foods thoroughly so they have a soft texture. Meal planning Make sure you include foods from all food groups to eat a balanced diet. Eat a variety of types of foods. Eat foods and drink beverages that do not cause you discomfort. These may include soups and broths with cooked meats, pasta, and vegetables. Lifestyle Sit up after meals, avoid tight clothing, and take time to eat and chew your food slowly. Ask your health care provider whether you should take dietary supplements. General information Mildly season your foods. Some seasonings, such as cayenne pepper, vinegar, or hot sauce, may cause irritation. The foods,  beverages, or seasonings to avoid should be based on individual tolerance. What foods should I eat? Fruits Canned or cooked fruit such as peaches, pears, or applesauce. Bananas. Vegetables Well-cooked vegetables. Canned or cooked vegetables such as carrots, green beans, beets, or spinach. Mashed or boiled potatoes. Grains  Hot cereals, such as cream of wheat and processed oatmeal. Rice. Bread, crackers, pasta, or tortillas made from refined white flour. Meats and other proteins  Eggs. Creamy peanut butter or other nut butters. Lean, well-cooked tender meats, such as beef, pork, chicken, or fish. Dairy Low-fat dairy products such as milk, cottage cheese, or yogurt. Beverages  Water. Herbal tea. Apple juice. Fats and oils Mild salad dressings. Canola or olive oil. Sweets and desserts Low-fat pudding, custard, or ice cream. Fruit gelatin. The items listed above may not be a complete list of foods and beverages you can eat. Contact a dietitian for more information. What foods should I avoid? Fruits Citrus fruits, such as oranges and grapefruit. Fruits with a stringy texture. Fruits that have lots of seeds, such as kiwi or strawberries. Dried fruits. Vegetables Raw, uncooked vegetables. Salads. Grains Whole grain breads, muffins, and cereals. Meats and other proteins Tough, fibrous meats. Highly seasoned meat such as corned beef, smoked meats, or fish. Processed high-fat meats such as brats, hot dogs, or sausage. Dairy Full-fat dairy foods such as ice cream and cheese. Beverages Caffeinated drinks. Alcohol. Seasonings and condiments Strongly flavored seasonings or condiments. Hot sauce. Salsa. Other foods Spicy foods. Fried or greasy foods. Sour foods, such as pickled or fermented foods like sauerkraut. Foods high in fiber.  The items listed above may not be a complete list of foods and beverages you should avoid. Contact a dietitian for more information. Summary A bland diet  should be based on individual tolerance. It may consist of foods that are soft textured and do not have a lot of fat, fiber, acid, or seasonings. A bland diet may be recommended because avoiding certain foods, beverages, or spices may make you feel better. This information is not intended to replace advice given to you by your health care provider. Make sure you discuss any questions you have with your health care provider. Document Revised: 01/09/2021 Document Reviewed: 01/09/2021 Elsevier Patient Education  [place].

## 2021-08-02 ENCOUNTER — Other Ambulatory Visit: Payer: Self-pay

## 2021-08-02 ENCOUNTER — Ambulatory Visit
Admission: RE | Admit: 2021-08-02 | Discharge: 2021-08-02 | Disposition: A | Discharge status: Home / Self Care | Payer: Medicare HMO | Source: Ambulatory Visit | Attending: Radiation Oncology | Admitting: Radiation Oncology

## 2021-08-02 DIAGNOSIS — Z510 Encounter for antineoplastic radiation therapy: Secondary | ICD-10-CM | POA: Diagnosis not present

## 2021-08-02 DIAGNOSIS — C20 Malignant neoplasm of rectum: Secondary | ICD-10-CM | POA: Diagnosis not present

## 2021-08-02 LAB — RAD ONC ARIA SESSION SUMMARY
Course Elapsed Days: 30
Plan Fractions Treated to Date: 22
Plan Prescribed Dose Per Fraction: 1.8 Gy
Plan Total Fractions Prescribed: 25
Plan Total Prescribed Dose: 45 Gy
Reference Point Dosage Given to Date: 39.6 Gy
Reference Point Session Dosage Given: 1.8 Gy
Session Number: 22

## 2021-08-02 LAB — URINALYSIS, ROUTINE W REFLEX MICROSCOPIC
Bacteria, UA: NONE SEEN /HPF
Bilirubin Urine: NEGATIVE
Glucose, UA: NEGATIVE
Hgb urine dipstick: NEGATIVE
Ketones, ur: NEGATIVE
Nitrite: NEGATIVE
Specific Gravity, Urine: 1.017 (ref 1.001–1.035)
pH: 6 (ref 5.0–8.0)

## 2021-08-02 LAB — MICROSCOPIC MESSAGE

## 2021-08-02 LAB — URINE CULTURE
MICRO NUMBER:: 13458455
SPECIMEN QUALITY:: ADEQUATE

## 2021-08-03 ENCOUNTER — Telehealth: Payer: Self-pay

## 2021-08-03 ENCOUNTER — Other Ambulatory Visit: Payer: Self-pay

## 2021-08-03 ENCOUNTER — Ambulatory Visit
Admission: RE | Admit: 2021-08-03 | Discharge: 2021-08-03 | Disposition: A | Discharge status: Home / Self Care | Payer: Medicare HMO | Source: Ambulatory Visit | Attending: Radiation Oncology | Admitting: Radiation Oncology

## 2021-08-03 DIAGNOSIS — C20 Malignant neoplasm of rectum: Secondary | ICD-10-CM | POA: Insufficient documentation

## 2021-08-03 DIAGNOSIS — Z510 Encounter for antineoplastic radiation therapy: Secondary | ICD-10-CM | POA: Diagnosis not present

## 2021-08-03 LAB — RAD ONC ARIA SESSION SUMMARY
Course Elapsed Days: 31
Plan Fractions Treated to Date: 23
Plan Prescribed Dose Per Fraction: 1.8 Gy
Plan Total Fractions Prescribed: 25
Plan Total Prescribed Dose: 45 Gy
Reference Point Dosage Given to Date: 41.4 Gy
Reference Point Session Dosage Given: 1.8 Gy
Session Number: 23

## 2021-08-03 NOTE — Telephone Encounter (Signed)
T/C to pt to advise of lab results and new instructions for her potassium.  She stated she had only been on 30 meq's for one week before lab work.  She also stated that she had had severe diarrhea on the weekend before and feels that may have contributed to the low K.  She is going to continue on the 30 meq's daily for now.

## 2021-08-03 NOTE — Telephone Encounter (Signed)
-----   Message from Alla Feeling, NP sent at 08/01/2021  6:48 PM EDT ----- Caryl Asp, could you please call pt to see how much K she is taking. Looks like 3 of the 10 meq's daily. I recommend to increase to 40 total, 2 tabs BID. She saw her PCP today, not sure if they adjusted her dose.   Thanks, Regan Rakers, NP

## 2021-08-03 NOTE — Telephone Encounter (Signed)
Lvm for pt to return call in regards to labs.  Per Dr.Tracy: Overall kidney function gfr 52.58 try to drink water 55-64 ounces  Potassium normal  Avoid nsaids  She is prediabetic 6.2  Thyroid lab normal  Magnesium normal     Urine culture no uti

## 2021-08-04 ENCOUNTER — Other Ambulatory Visit: Payer: Self-pay

## 2021-08-04 ENCOUNTER — Ambulatory Visit
Admission: RE | Admit: 2021-08-04 | Discharge: 2021-08-04 | Disposition: A | Discharge status: Home / Self Care | Payer: Medicare HMO | Source: Ambulatory Visit | Attending: Radiation Oncology | Admitting: Radiation Oncology

## 2021-08-04 ENCOUNTER — Telehealth: Payer: Self-pay

## 2021-08-04 ENCOUNTER — Other Ambulatory Visit (HOSPITAL_COMMUNITY): Payer: Self-pay

## 2021-08-04 DIAGNOSIS — Z510 Encounter for antineoplastic radiation therapy: Secondary | ICD-10-CM | POA: Diagnosis not present

## 2021-08-04 DIAGNOSIS — C20 Malignant neoplasm of rectum: Secondary | ICD-10-CM | POA: Diagnosis not present

## 2021-08-04 LAB — RAD ONC ARIA SESSION SUMMARY
Course Elapsed Days: 32
Plan Fractions Treated to Date: 24
Plan Prescribed Dose Per Fraction: 1.8 Gy
Plan Total Fractions Prescribed: 25
Plan Total Prescribed Dose: 45 Gy
Reference Point Dosage Given to Date: 43.2 Gy
Reference Point Session Dosage Given: 1.8 Gy
Session Number: 24

## 2021-08-04 NOTE — Telephone Encounter (Signed)
-----   Message from Alla Feeling, NP sent at 08/01/2021  6:48 PM EDT ----- Caryl Asp, could you please call pt to see how much K she is taking. Looks like 3 of the 10 meq's daily. I recommend to increase to 40 total, 2 tabs BID. She saw her PCP today, not sure if they adjusted her dose.   Thanks, Regan Rakers, NP

## 2021-08-04 NOTE — Telephone Encounter (Signed)
This nurse reached out to patient and verified with patient if she is still taking 3 tabs daily and she verified that she is.  Informed that Potassium level is slightly low and the providers recommendations listed below.  Patient stated that the weekend prior to labs she had loose stools which may have contributed to her labs being off.  She would like to continue with current dose and if her labs are still low on Monday when she comes for scheduled appointments then she will increase based on providers recommendations.  This nurse stated this information will be forwarded to the provider.  No further questions or concerns at this time.

## 2021-08-05 DIAGNOSIS — Z510 Encounter for antineoplastic radiation therapy: Secondary | ICD-10-CM | POA: Diagnosis not present

## 2021-08-05 DIAGNOSIS — C20 Malignant neoplasm of rectum: Secondary | ICD-10-CM | POA: Diagnosis not present

## 2021-08-06 NOTE — Progress Notes (Signed)
 " [facility]   Telephone:(336) [phone] Fax:(336) [phone]   Clinic Follow up Note   Patient Care Team: McLean-Scocuzza, Randine SAILOR, MD as PCP - General (Internal Medicine) 08/07/2021  CHIEF COMPLAINT: Follow up rectal cancer   SUMMARY OF ONCOLOGIC HISTORY: Oncology History  Rectal cancer (HCC)  04/25/2021 Initial Biopsy   Colonoscopy with Dr. Aundria in [place], [place] -Per the notes in epic, she was found to have an infiltrative, sessile, ulcerated nonobstructing mass in the distal rectum measuring 2 cm in length and diameter, 0 to 3 cm from the anal verge. 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   04/25/2021 Initial Diagnosis   Rectal cancer (HCC)    04/25/2021 Cancer Staging   Staging form: Colon and Rectum, AJCC 8th Edition - Clinical stage from 04/25/2021: Stage I (cT2, cN0, cM0) - Signed by Payslee Bateson K, NP on 06/19/2021 Stage prefix: Initial diagnosis Total positive nodes: 0    04/25/2021 Tumor Marker   Baseline CEA: 1.8 (normal)   04/28/2021 Imaging   Local staging pelvic MRI: IMPRESSION: TUMOR DESCRIPTION Circumferential Extent: 100 degrees from 9 o'clock to 5 o'clock along the posterior RIGHT border of the rectum.   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 Lesion at anorectal junction.     05/09/2021 Imaging   Staging CT chest/abdomen IMPRESSION: 1. No evidence of metastatic disease within the chest or abdomen. 2. Hepatic steatosis. 3. Punctate nonobstructive left renal stone. 4. Bilateral renal cysts, including a hemorrhagic/proteinaceous right upper pole renal cyst. 5.  Aortic Atherosclerosis (ICD10-I70.0).   05/26/2021 Surgery   Transanal excision of distal rectal cancer by Dr. Lonni Pizza   05/26/2021 Pathology Results   A. RECTUM,  RIGHT ANTERIOR LATERAL DISTAL, RESECTION:  - Invasive moderately differentiated adenocarcinoma, 2.5 cm  - Carcinoma focally invades into superficial muscularis propria  - Resection margins are negative for carcinoma  - Lateral mucosal margins are negative for dysplasia  - No evidence of lymphovascular invasion  - See oncology table  pT2, PN X IHC EXPRESSION RESULTS  TEST           RESULT  MLH1:          Preserved nuclear expression  MSH2:          Preserved nuclear expression  MSH6:          Preserved nuclear expression  PMS2:          Preserved nuclear expression     05/26/2021 Cancer Staging   Staging form: Colon and Rectum, AJCC 8th Edition - Pathologic stage from 05/26/2021: Stage Unknown (pT2, pNX, cM0) - Signed by Kida Digiulio K, NP on 06/19/2021      CURRENT THERAPY: Concurrent chemoRT with Xeloda  07/03/21 - ~08/14/21 (Xeloda  dose 1500 mg BID M-F)  INTERVAL HISTORY: Ms. Augello returns for follow up as scheduled. Last seen by Dr. Lanny 07/24/21. She continues chemoRT, this is her last week.  The weekend before last (5/26-28) she had increased diarrhea that was difficult to manage, requiring 4 Imodium at once.  This eventually resolved and she had a good week last week.  She still has loose stool 2-3/day, well managed with 1 Imodium per day.  She is able to drink but has to force oral intake.  Her appetite is decreased.  She drinks 1-2 nutrition supplements per day.  She has nausea  most days, no vomiting.  Zofran  helps.  Her skin is slightly red, no rectal pain, discharge, or bleeding.  The cream helps.  She still has a rash on her hands, arms, chest, and a little bit on her legs.  She thinks this is overall better not new outbreaks.  She is tired, missed church the past 2 weekends.  All other systems were reviewed with the patient and are negative.  MEDICAL HISTORY:  Past Medical History:  Diagnosis Date   Arthritis    back knees, shoulders   Basal cell carcinoma    right temple Dr.  Therisa Molly removed 01/2017    Chicken pox    as child   Chronic back pain    COVID-19 10/13/2019   scratchy throat chills fever x 6 weeks x 6 weeks all symptoms resolved   Family history of bladder cancer    Family history of breast cancer    Family history of lung cancer    Family history of stomach cancer    Fatty liver    09/19/15   Gastric ulcer    neg H pylori  diet controlled   GERD (gastroesophageal reflux disease)    Gout    Hiatal hernia    SMALL   History of kidney stones    Hyperlipidemia    Hypertension    Lumbar herniated disc    Rectal cancer (HCC)    Recurrent UTI    last uti 6 months ago per pt on 05-23-2021   Rosacea    Syncope none since 2018 per pt    per pt presyncope no LOC felt dizzy/vision distorted and couldnt speak EEG neg, CT head neg 07/31/16, f/u [facility] cardiology with US  carotid, echo but no Holter    Umbilical hernia    noted 09/19/15    UTI (urinary tract infection)    Vitamin B 12 deficiency    Vitamin D  deficiency    Wears glasses     SURGICAL HISTORY: Past Surgical History:  Procedure Laterality Date   BREAST BIOPSY Right 1980   cyst removed negative pathology    colonscopy  04/17/2021   at [facility]   CYSTOSCOPY W/ RETROGRADES Left 02/18/2020   Procedure: CYSTOSCOPY WITH RETROGRADE PYELOGRAM;  Surgeon: Kassie Ozell SAUNDERS, MD;  Location: [facility];  Service: Urology;  Laterality: Left;   EXTRACORPOREAL SHOCK WAVE LITHOTRIPSY Left 09/19/2017   Procedure: EXTRACORPOREAL SHOCK WAVE LITHOTRIPSY (ESWL);  Surgeon: Kassie Ozell SAUNDERS, MD;  Location: [facility];  Service: Urology;  Laterality: Left;   JOINT REPLACEMENT     right knee 11/2015 Dr. Elgin Benjamin [facility]   RECTAL EXAM UNDER ANESTHESIA N/A 05/26/2021   Procedure: ANORECTAL EXAM UNDER ANESTHESIA;  Surgeon: Teresa Lonni HERO, MD;  Location: [facility];  Service: General;  Laterality: N/A;   TRANSANAL EXCISION OF RECTAL MASS N/A 05/26/2021   Procedure: TRANSANAL EXCISION OF  RIGHT ANTERIOR LATERAL DISTAL RECTAL CANCER;  Surgeon: Teresa Lonni HERO, MD;  Location: [facility];  Service: General;  Laterality: N/A;    I have reviewed the social history and family history with the patient and they are unchanged from previous note.  ALLERGIES:  is allergic to nortriptyline and sulfa antibiotics.  MEDICATIONS:  Current Outpatient Medications  Medication Sig Dispense Refill   acetaminophen  (TYLENOL ) 500 MG tablet Take 1,000 mg by mouth 2 (two) times daily.      allopurinol  (ZYLOPRIM ) 300 MG tablet Take 1 tablet (300 mg total) by mouth daily. (  Patient not taking: Reported on 06/21/2021) 90 tablet 3   amLODipine  (NORVASC ) 2.5 MG tablet Take 1-2 tablets (2.5-5 mg total) by mouth daily. 180 tablet 3   atorvastatin  (LIPITOR) 40 MG tablet Take 1 tablet (40 mg total) by mouth daily at 6 PM. 90 tablet 3   capecitabine  (XELODA ) 500 MG tablet Take 4 tabs in morning and 3 tabs in evening, take after meals, take on days of radiation only, Monday through Friday 105 tablet 1   Cholecalciferol (VITAMIN D3) 5000 units CAPS Take 5,000 Units by mouth daily.      Cyanocobalamin (B-12) 5000 MCG CAPS Take 5,000 mcg by mouth daily.      ezetimibe  (ZETIA ) 10 MG tablet Take 1 tablet (10 mg total) by mouth at bedtime. 90 tablet 3   febuxostat  (ULORIC ) 40 MG tablet Take 1 tablet (40 mg total) by mouth daily. Hold allopurinol  90 tablet 3   gabapentin  (NEURONTIN ) 300 MG capsule Take 1 capsule (300 mg total) by mouth at bedtime. 90 capsule 3   ketoconazole  (NIZORAL ) 2 % cream Apply 1 application topically 2 (two) times daily as needed for irritation. FACE 30 g 11   Magnesium 500 MG CAPS Take 500 mg by mouth at bedtime.      methocarbamol  (ROBAXIN ) 750 MG tablet Take 1 tablet (750 mg total) by mouth at bedtime. 90 tablet 3   omeprazole  (PRILOSEC) 20 MG capsule Take 1 capsule (20 mg total) by mouth daily. 30 min before food 90 capsule 3   omeprazole  (PRILOSEC) 20 MG capsule Take 20 mg  by mouth daily.     ondansetron  (ZOFRAN ) 4 MG tablet Take 1-2 tablets (4-8 mg total) by mouth every 8 (eight) hours as needed for nausea or vomiting. 30 tablet 2   potassium chloride  (MICRO-K ) 10 MEQ CR capsule Take 3 capsules (30 mEq total) by mouth every morning. 360 capsule 3   traMADol  (ULTRAM ) 50 MG tablet Takes 1 tab in am , 2 at hs 170 tablet 5   triamcinolone  ointment (KENALOG ) 0.5 % Apply 1 application. topically 2 (two) times daily. 30 g 1   triamterene -hydrochlorothiazide  (MAXZIDE -25) 37.5-25 MG tablet Take 1 tablet by mouth every morning. 90 tablet 3   No current facility-administered medications for this visit.    PHYSICAL EXAMINATION: ECOG PERFORMANCE STATUS: 1 - Symptomatic but completely ambulatory  Vitals:   08/07/21 1025  BP: (!) 149/63  Pulse: 66  Resp: 17  Temp: 97.6 F (36.4 C)  SpO2: 96%   Filed Weights   08/07/21 1025  Weight: 209 lb 9.6 oz (95.1 kg)    GENERAL:alert, no distress and comfortable SKIN: Scattered maculopapular rash to the dorsum of hands, forearms, neck/chest, and lower legs EYES:  sclera clear LUNGS: normal breathing effort HEART:  no lower extremity edema NEURO: alert & oriented x 3 with fluent speech  LABORATORY DATA:  I have reviewed the data as listed    Latest Ref Rng & Units 08/07/2021   10:08 AM 08/01/2021   10:42 AM 07/24/2021   11:38 AM  CBC  WBC 4.0 - 10.5 K/uL 6.4   7.1   6.2    Hemoglobin 12.0 - 15.0 g/dL 87.6   87.5   87.6    Hematocrit 36.0 - 46.0 % 36.0   36.0   36.0    Platelets 150 - 400 K/uL 245   259   223          Latest Ref Rng & Units 08/07/2021   10:08 AM  08/01/2021   10:42 AM 08/01/2021    9:43 AM  CMP  Glucose 70 - 99 mg/dL 864   873   891    BUN 8 - 23 mg/dL 17   18   18     Creatinine 0.44 - 1.00 mg/dL 9.04   8.95   8.94    Sodium 135 - 145 mmol/L 139   136   137    Potassium 3.5 - 5.1 mmol/L 3.3   3.3   3.5    Chloride 98 - 111 mmol/L 98   97   96    CO2 22 - 32 mmol/L 32   29   28    Calcium  8.9 -  10.3 mg/dL 89.9   89.9   9.8    Total Protein 6.5 - 8.1 g/dL 6.8   7.3     Total Bilirubin 0.3 - 1.2 mg/dL 0.8   0.9     Alkaline Phos 38 - 126 U/L 81   86     AST 15 - 41 U/L 16   17     ALT 0 - 44 U/L 17   19         RADIOGRAPHIC STUDIES: I have personally reviewed the radiological images as listed and agreed with the findings in the report. No results found.   ASSESSMENT & PLAN: 76 yo female    Adenocarcinoma of the distal rectum, G2, pT2, cN0M0 stage IA, MMR normal - She presented asymptomatically to GI with positive Cologuard, work-up found 2 cm distal rectal mass that was palpable on DRE.  Path confirmed invasive moderately differentiated adenocarcinoma. -Staging CT was negative for distant metastasis, local staging MRI showed cT2N0 disease without lymphovascular invasion -She was taken to the OR by Dr. Teresa for upfront transanal excision, tumor was completely removed with negative margins.  Final path showed T2, MMR normal -Due to the moderate risk of recurrence, she was started on adjuvant concurrent chemo RT with Xeloda  07/03/2021  -Currently on week 6, plan to complete 6/12 -Ms. Caponi appears stable.  She is tolerating moderately well with skin rash, low appetite, nausea, and diarrhea.  Xeloda  has been dose reduced to 1500 mg twice daily on M-F (days with radiation) -We discussed possible appetite stimulant, she prefers to hold off given that she will finish treatment next week.  I reviewed the expected recovery trajectory after completing chemo RT -Labs reviewed, K3.3, she agrees to increase to 40 mEq total daily.  Reviewed symptom management for nausea and diarrhea. -Continue chemo RT with Xeloda  at same dose 1500 mg twice daily on days with radiation -Lab and final treatment 6/12 -Then lab and follow-up in 3-4 weeks to monitor her recovery and discuss surveillance plan  Co-morbidities: Factor V heterozygous, HTN, HL, chronic pain, osteopenia, arthritis, GERD, gout -Follow-up  PCP, continue med regimen -On tramadol , Robaxin , and gabapentin  for chronic pain -She required postop anticoagulation for orthopedic surgery in the past   Social, family history  -Ms. Cajuste lives independently with strong social/church support and is close to her nephew -We reviewed our immunity resources including nutrition/dietitian, social work, orthoptist if needed -she has strong family history of cancer including a brother with bladder cancer, mother with breast cancer, and a paternal aunt with multiple cancers including CRC and breast.  -I reviewed this would not change her adjuvant treatment plan at this time but she would benefit from genetic testing.  She is interested and agrees to a referral -Testing 07/03/2021 was negative  Goals of care -She understands the goal of adjuvant chemo is curative    Plan: -Labs reviewed, continue chemo RT with Xeloda  1500 mg twice daily on M-F days with radiation -Increase potassium to 4 tabs (10 mEq) daily -Refill Zofran  -Increase nutrition supplements, she declined appetite stimulant -Lab and last treatment 6/12 -Lab, follow-up in 3-4 weeks   All questions were answered. The patient knows to call the clinic with any problems, questions or concerns. No barriers to learning were detected.      Kreg Earhart K Chanta Bauers, NP 08/07/21     "

## 2021-08-07 ENCOUNTER — Ambulatory Visit
Admission: RE | Admit: 2021-08-07 | Discharge: 2021-08-07 | Disposition: A | Discharge status: Home / Self Care | Payer: Medicare HMO | Source: Ambulatory Visit | Attending: Radiation Oncology | Admitting: Radiation Oncology

## 2021-08-07 ENCOUNTER — Other Ambulatory Visit (HOSPITAL_COMMUNITY): Payer: Self-pay

## 2021-08-07 ENCOUNTER — Inpatient Hospital Stay: Payer: Medicare HMO

## 2021-08-07 ENCOUNTER — Other Ambulatory Visit: Payer: Self-pay

## 2021-08-07 ENCOUNTER — Encounter: Payer: Self-pay | Admitting: Nurse Practitioner

## 2021-08-07 ENCOUNTER — Inpatient Hospital Stay: Payer: Medicare HMO | Attending: Oncology | Admitting: Nurse Practitioner

## 2021-08-07 VITALS — BP 149/63 | HR 66 | Temp 97.6°F | Resp 17 | Ht 66.0 in | Wt 209.6 lb

## 2021-08-07 DIAGNOSIS — K219 Gastro-esophageal reflux disease without esophagitis: Secondary | ICD-10-CM | POA: Insufficient documentation

## 2021-08-07 DIAGNOSIS — I10 Essential (primary) hypertension: Secondary | ICD-10-CM | POA: Insufficient documentation

## 2021-08-07 DIAGNOSIS — C20 Malignant neoplasm of rectum: Secondary | ICD-10-CM

## 2021-08-07 DIAGNOSIS — R21 Rash and other nonspecific skin eruption: Secondary | ICD-10-CM | POA: Diagnosis not present

## 2021-08-07 DIAGNOSIS — M109 Gout, unspecified: Secondary | ICD-10-CM | POA: Insufficient documentation

## 2021-08-07 DIAGNOSIS — G8929 Other chronic pain: Secondary | ICD-10-CM | POA: Diagnosis not present

## 2021-08-07 DIAGNOSIS — Z79899 Other long term (current) drug therapy: Secondary | ICD-10-CM | POA: Diagnosis not present

## 2021-08-07 DIAGNOSIS — M1990 Unspecified osteoarthritis, unspecified site: Secondary | ICD-10-CM | POA: Diagnosis not present

## 2021-08-07 DIAGNOSIS — M8580 Other specified disorders of bone density and structure, unspecified site: Secondary | ICD-10-CM | POA: Insufficient documentation

## 2021-08-07 DIAGNOSIS — Z510 Encounter for antineoplastic radiation therapy: Secondary | ICD-10-CM | POA: Diagnosis not present

## 2021-08-07 LAB — RAD ONC ARIA SESSION SUMMARY
Course Elapsed Days: 35
Plan Fractions Treated to Date: 25
Plan Prescribed Dose Per Fraction: 1.8 Gy
Plan Total Fractions Prescribed: 25
Plan Total Prescribed Dose: 45 Gy
Reference Point Dosage Given to Date: 45 Gy
Reference Point Session Dosage Given: 1.8 Gy
Session Number: 25

## 2021-08-07 LAB — CBC WITH DIFFERENTIAL (CANCER CENTER ONLY)
Abs Immature Granulocytes: 0.02 10*3/uL (ref 0.00–0.07)
Basophils Absolute: 0 10*3/uL (ref 0.0–0.1)
Basophils Relative: 0 %
Eosinophils Absolute: 0.2 10*3/uL (ref 0.0–0.5)
Eosinophils Relative: 3 %
HCT: 36 % (ref 36.0–46.0)
Hemoglobin: 12.3 g/dL (ref 12.0–15.0)
Immature Granulocytes: 0 %
Lymphocytes Relative: 9 %
Lymphs Abs: 0.6 10*3/uL — ABNORMAL LOW (ref 0.7–4.0)
MCH: 34.1 pg — ABNORMAL HIGH (ref 26.0–34.0)
MCHC: 34.2 g/dL (ref 30.0–36.0)
MCV: 99.7 fL (ref 80.0–100.0)
Monocytes Absolute: 0.4 10*3/uL (ref 0.1–1.0)
Monocytes Relative: 6 %
Neutro Abs: 5.1 10*3/uL (ref 1.7–7.7)
Neutrophils Relative %: 82 %
Platelet Count: 245 10*3/uL (ref 150–400)
RBC: 3.61 MIL/uL — ABNORMAL LOW (ref 3.87–5.11)
RDW: 20 % — ABNORMAL HIGH (ref 11.5–15.5)
WBC Count: 6.4 10*3/uL (ref 4.0–10.5)
nRBC: 0 % (ref 0.0–0.2)

## 2021-08-07 LAB — CMP (CANCER CENTER ONLY)
ALT: 17 U/L (ref 0–44)
AST: 16 U/L (ref 15–41)
Albumin: 4.3 g/dL (ref 3.5–5.0)
Alkaline Phosphatase: 81 U/L (ref 38–126)
Anion gap: 9 (ref 5–15)
BUN: 17 mg/dL (ref 8–23)
CO2: 32 mmol/L (ref 22–32)
Calcium: 10 mg/dL (ref 8.9–10.3)
Chloride: 98 mmol/L (ref 98–111)
Creatinine: 0.95 mg/dL (ref 0.44–1.00)
GFR, Estimated: >60 mL/min
Glucose, Bld: 135 mg/dL — ABNORMAL HIGH (ref 70–99)
Potassium: 3.3 mmol/L — ABNORMAL LOW (ref 3.5–5.1)
Sodium: 139 mmol/L (ref 135–145)
Total Bilirubin: 0.8 mg/dL (ref 0.3–1.2)
Total Protein: 6.8 g/dL (ref 6.5–8.1)

## 2021-08-07 MED ORDER — ONDANSETRON HCL 4 MG PO TABS: 4.0000 mg | ORAL_TABLET | Freq: Three times a day (TID) | ORAL | 2 refills | Status: DC | PRN

## 2021-08-08 ENCOUNTER — Telehealth: Payer: Self-pay | Admitting: Hematology

## 2021-08-08 ENCOUNTER — Other Ambulatory Visit (HOSPITAL_COMMUNITY): Payer: Self-pay

## 2021-08-08 ENCOUNTER — Ambulatory Visit
Admission: RE | Admit: 2021-08-08 | Discharge: 2021-08-08 | Disposition: A | Discharge status: Home / Self Care | Payer: Medicare HMO | Source: Ambulatory Visit | Attending: Radiation Oncology | Admitting: Radiation Oncology

## 2021-08-08 ENCOUNTER — Other Ambulatory Visit: Payer: Self-pay

## 2021-08-08 DIAGNOSIS — Z510 Encounter for antineoplastic radiation therapy: Secondary | ICD-10-CM | POA: Diagnosis not present

## 2021-08-08 DIAGNOSIS — C20 Malignant neoplasm of rectum: Secondary | ICD-10-CM | POA: Diagnosis not present

## 2021-08-08 LAB — RAD ONC ARIA SESSION SUMMARY
Course Elapsed Days: 36
Plan Fractions Treated to Date: 1
Plan Prescribed Dose Per Fraction: 1.8 Gy
Plan Total Fractions Prescribed: 5
Plan Total Prescribed Dose: 9 Gy
Reference Point Dosage Given to Date: 46.8 Gy
Reference Point Session Dosage Given: 1.8 Gy
Session Number: 26

## 2021-08-08 NOTE — Telephone Encounter (Signed)
Scheduled follow-up appointments per 6/5 los. Patient is aware. 

## 2021-08-09 ENCOUNTER — Other Ambulatory Visit: Payer: Self-pay

## 2021-08-09 ENCOUNTER — Ambulatory Visit
Admission: RE | Admit: 2021-08-09 | Discharge: 2021-08-09 | Disposition: A | Discharge status: Home / Self Care | Payer: Medicare HMO | Source: Ambulatory Visit | Attending: Radiation Oncology | Admitting: Radiation Oncology

## 2021-08-09 ENCOUNTER — Other Ambulatory Visit (HOSPITAL_COMMUNITY): Payer: Self-pay

## 2021-08-09 DIAGNOSIS — C20 Malignant neoplasm of rectum: Secondary | ICD-10-CM | POA: Diagnosis not present

## 2021-08-09 LAB — RAD ONC ARIA SESSION SUMMARY
Course Elapsed Days: 37
Plan Fractions Treated to Date: 2
Plan Prescribed Dose Per Fraction: 1.8 Gy
Plan Total Fractions Prescribed: 5
Plan Total Prescribed Dose: 9 Gy
Reference Point Dosage Given to Date: 48.6 Gy
Reference Point Session Dosage Given: 1.8 Gy
Session Number: 27

## 2021-08-10 ENCOUNTER — Other Ambulatory Visit: Payer: Self-pay

## 2021-08-10 ENCOUNTER — Ambulatory Visit: Payer: Medicare HMO

## 2021-08-10 ENCOUNTER — Ambulatory Visit
Admission: RE | Admit: 2021-08-10 | Discharge: 2021-08-10 | Disposition: A | Discharge status: Home / Self Care | Payer: Medicare HMO | Source: Ambulatory Visit | Attending: Radiation Oncology | Admitting: Radiation Oncology

## 2021-08-10 DIAGNOSIS — C20 Malignant neoplasm of rectum: Secondary | ICD-10-CM | POA: Diagnosis not present

## 2021-08-10 LAB — RAD ONC ARIA SESSION SUMMARY
Course Elapsed Days: 38
Plan Fractions Treated to Date: 3
Plan Prescribed Dose Per Fraction: 1.8 Gy
Plan Total Fractions Prescribed: 5
Plan Total Prescribed Dose: 9 Gy
Reference Point Dosage Given to Date: 50.4 Gy
Reference Point Session Dosage Given: 1.8 Gy
Session Number: 28

## 2021-08-11 ENCOUNTER — Other Ambulatory Visit: Payer: Self-pay

## 2021-08-11 ENCOUNTER — Ambulatory Visit
Admission: RE | Admit: 2021-08-11 | Discharge: 2021-08-11 | Disposition: A | Discharge status: Home / Self Care | Payer: Medicare HMO | Source: Ambulatory Visit | Attending: Radiation Oncology | Admitting: Radiation Oncology

## 2021-08-11 DIAGNOSIS — C20 Malignant neoplasm of rectum: Secondary | ICD-10-CM | POA: Diagnosis not present

## 2021-08-11 LAB — RAD ONC ARIA SESSION SUMMARY
Course Elapsed Days: 39
Plan Fractions Treated to Date: 4
Plan Prescribed Dose Per Fraction: 1.8 Gy
Plan Total Fractions Prescribed: 5
Plan Total Prescribed Dose: 9 Gy
Reference Point Dosage Given to Date: 52.2 Gy
Reference Point Session Dosage Given: 1.8 Gy
Session Number: 29

## 2021-08-14 ENCOUNTER — Ambulatory Visit: Payer: Self-pay | Admitting: Licensed Clinical Social Worker

## 2021-08-14 ENCOUNTER — Ambulatory Visit: Payer: Medicare HMO

## 2021-08-14 ENCOUNTER — Inpatient Hospital Stay: Payer: Medicare HMO

## 2021-08-14 ENCOUNTER — Encounter: Payer: Self-pay | Admitting: Licensed Clinical Social Worker

## 2021-08-14 DIAGNOSIS — Z1379 Encounter for other screening for genetic and chromosomal anomalies: Secondary | ICD-10-CM | POA: Insufficient documentation

## 2021-08-14 NOTE — Progress Notes (Signed)
HPI:  Ms. Drozdowski was previously seen in the [facility] clinic due to a personal and family history of cancer and concerns regarding a hereditary predisposition to cancer. Please refer to our prior cancer genetics clinic note for more information regarding our discussion, assessment and recommendations, at the time. Ms. Duhart recent genetic test results were disclosed to her, as were recommendations warranted by these results. These results and recommendations are discussed in more detail below.  CANCER HISTORY:  Oncology History  Rectal cancer ([place])  04/25/2021 Initial Biopsy   Colonoscopy with Dr. Alice Reichert in [place], [place] -Per the notes in epic, she was found to have an infiltrative, sessile, ulcerated nonobstructing mass in the distal rectum measuring 2 cm in length and diameter, 0 to 3 cm from the anal verge. 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   04/25/2021 Initial Diagnosis   Rectal cancer ([place])   04/25/2021 Cancer Staging   Staging form: Colon and Rectum, AJCC 8th Edition - Clinical stage from 04/25/2021: Stage I (cT2, cN0, cM0) - Signed by Alla Feeling, NP on 06/19/2021 Stage prefix: Initial diagnosis Total positive nodes: 0   04/25/2021 Tumor Marker   Baseline CEA: 1.8 (normal)   04/28/2021 Imaging   Local staging pelvic MRI: IMPRESSION: TUMOR DESCRIPTION Circumferential Extent: 100 degrees from 9 o'clock to 5 o'clock along the posterior RIGHT border of the rectum.   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 Lesion at anorectal junction.     05/09/2021 Imaging   Staging CT chest/abdomen IMPRESSION: 1. No evidence of metastatic disease within the chest or abdomen. 2. Hepatic steatosis. 3. Punctate nonobstructive left renal stone. 4. Bilateral renal  cysts, including a hemorrhagic/proteinaceous right upper pole renal cyst. 5.  Aortic Atherosclerosis (ICD10-I70.0).   05/26/2021 Surgery   Transanal excision of distal rectal cancer by Dr. Nadeen Landau   05/26/2021 Pathology Results   A. RECTUM, RIGHT ANTERIOR LATERAL DISTAL, RESECTION:  - Invasive moderately differentiated adenocarcinoma, 2.5 cm  - Carcinoma focally invades into superficial muscularis propria  - Resection margins are negative for carcinoma  - Lateral mucosal margins are negative for dysplasia  - No evidence of lymphovascular invasion  - See oncology table  pT2, PN X IHC EXPRESSION RESULTS  TEST           RESULT  MLH1:          Preserved nuclear expression  MSH2:          Preserved nuclear expression  MSH6:          Preserved nuclear expression  PMS2:          Preserved nuclear expression     05/26/2021 Cancer Staging   Staging form: Colon and Rectum, AJCC 8th Edition - Pathologic stage from 05/26/2021: Stage Unknown (pT2, pNX, cM0) - Signed by Alla Feeling, NP on 06/19/2021    Genetic Testing   Negative genetic testing. No pathogenic variants identified on the [employer] CustomNext+RNA panel. The report date is 07/14/2021.  The CustomNext-Cancer+RNAinsight panel offered by [facility] includes sequencing and rearrangement analysis for the following 47 genes:  APC, ATM, AXIN2, BARD1, BMPR1A, BRCA1, BRCA2, BRIP1, CDH1, CDK4, CDKN2A, CHEK2, DICER1, EPCAM, GREM1, HOXB13, MEN1, MLH1, MSH2, MSH3, MSH6, MUTYH, NBN, NF1, NF2, NTHL1, PALB2, PMS2, POLD1, POLE, PTEN, RAD51C, RAD51D, RECQL, RET, SDHA, SDHAF2, SDHB, SDHC, SDHD, SMAD4, SMARCA4,  STK11, TP53, TSC1, TSC2, and VHL.  RNA data is routinely analyzed for use in variant interpretation for all genes.     FAMILY HISTORY:  We obtained a detailed, 4-generation family history.  Significant diagnoses are listed below: Family History  Problem Relation Age of Onset   Stroke Mother    Breast cancer Mother 50   Cancer Mother         breast cancer s/p masectomy lived to be 39    Hypertension Mother    Cancer Father        NMSC skin cancer    Gout Father    Heart disease Father        s/p bypass surgery age 69 lived 23 months after surgery    Kidney disease Father        RAS>kidney failure    Bladder Cancer Brother    Hyperlipidemia Paternal Aunt    Stroke Paternal Aunt    Breast cancer Paternal Aunt    Rectal cancer Paternal Aunt    Kidney cancer Neg Hx    Prolactinoma Neg Hx       Ms. Kooi had 1 brother, he had bladder cancer at 91 and passed at 61. He had 1 son, 59, who Ms. Suderman is close with.    Ms. Gomes mother had breast cancer at 50 and passed at 13. Patient had 11 maternal aunts/uncles, no cancers for them or for her cousins. Maternal grandmother passed at 28, grandfather passed at 73.   Ms. Erskin father had history of skin cancer (basal cell carcinomas) and he passed at 30 due to kidney failure. Patient had 2 paternal aunts, 3 paternal uncles. One aunt had colorectal cancer in her 33s-70s, breast cancer in her 11s, and possibly another type of cancer diagnosed earlier in life. An uncle had lung cancer. A cousin had uterine in her late 13s. Paternal grandmother had stomach cancer and died at 85, grandfather died at 60.    Ms. Milholland is unaware of previous family history of genetic testing for hereditary cancer risks. Patient's maternal ancestors are of unknown descent, and paternal ancestors are of German/unknown descent. There is no reported Ashkenazi Jewish ancestry. There is no known consanguinity.     GENETIC TEST RESULTS: Genetic testing reported out on 07/14/2021 through the [facility] CustomNext+RNA cancer panel found no pathogenic mutations.   The CustomNext-Cancer+RNAinsight panel offered by [facility] includes sequencing and rearrangement analysis for the following 47 genes:  APC, ATM, AXIN2, BARD1, BMPR1A, BRCA1, BRCA2, BRIP1, CDH1, CDK4, CDKN2A, CHEK2, DICER1, EPCAM, GREM1, HOXB13,  MEN1, MLH1, MSH2, MSH3, MSH6, MUTYH, NBN, NF1, NF2, NTHL1, PALB2, PMS2, POLD1, POLE, PTEN, RAD51C, RAD51D, RECQL, RET, SDHA, SDHAF2, SDHB, SDHC, SDHD, SMAD4, SMARCA4, STK11, TP53, TSC1, TSC2, and VHL.  RNA data is routinely analyzed for use in variant interpretation for all genes.   The test report has been scanned into EPIC and is located under the Molecular Pathology section of the Results Review tab.  A portion of the result report is included below for reference.     We discussed that because current genetic testing is not perfect, it is possible there may be a gene mutation in one of these genes that current testing cannot detect, but that chance is small.  There could be another gene that has not yet been discovered, or that we have not yet tested, that is responsible for the cancer diagnoses in the family. It is also possible there is a hereditary cause for the cancer in  the family that Ms. Bolla did not inherit and therefore was not identified in her testing.  Therefore, it is important to remain in touch with cancer genetics in the future so that we can continue to offer Ms. Perlmutter the most up to date genetic testing.   ADDITIONAL GENETIC TESTING: We discussed with Ms. Hinchman that her genetic testing was fairly extensive.  If there are genes identified to increase cancer risk that can be analyzed in the future, we would be happy to discuss and coordinate this testing at that time.    CANCER SCREENING RECOMMENDATIONS: Ms. Depaula test result is considered negative (normal).  This means that we have not identified a hereditary cause for her  personal and family history of cancer at this time. Most cancers happen by chance and this negative test suggests that her cancer may fall into this category.    While reassuring, this does not definitively rule out a hereditary predisposition to cancer. It is still possible that there could be genetic mutations that are undetectable by current technology. There  could be genetic mutations in genes that have not been tested or identified to increase cancer risk.  Therefore, it is recommended she continue to follow the cancer management and screening guidelines provided by her oncology and primary healthcare provider.   An individual's cancer risk and medical management are not determined by genetic test results alone. Overall cancer risk assessment incorporates additional factors, including personal medical history, family history, and any available genetic information that may result in a personalized plan for cancer prevention and surveillance.  RECOMMENDATIONS FOR FAMILY MEMBERS:  Relatives in this family might be at some increased risk of developing cancer, over the general population risk, simply due to the family history of cancer.  We recommended female relatives in this family have a yearly mammogram beginning at age 68, or 46 years younger than the earliest onset of cancer, an annual clinical breast exam, and perform monthly breast self-exams. Female relatives in this family should also have a gynecological exam as recommended by their primary provider.  All family members should be referred for colonoscopy starting at age 37.    It is also possible there is a hereditary cause for the cancer in Ms. Korol's family that she did not inherit and therefore was not identified in her.  Based on Ms. Grace's family history, we recommended her nephew/paternal relatives have genetic counseling and testing. Ms. Piccini will let us know if we can be of any assistance in coordinating genetic counseling and/or testing for these family members.  FOLLOW-UP: Lastly, we discussed with Ms. Bonnell that cancer genetics is a rapidly advancing field and it is possible that new genetic tests will be appropriate for her and/or her family members in the future. We encouraged her to remain in contact with cancer genetics on an annual basis so we can update her personal and family  histories and let her know of advances in cancer genetics that may benefit this family.   Our contact number was provided. Ms. Marich questions were answered to her satisfaction, and she knows she is welcome to call us at anytime with additional questions or concerns.   Faith Rogue, MS, [facility] Genetic Counselor [email or web address] Phone: [phone]

## 2021-08-14 NOTE — Telephone Encounter (Signed)
Revealed negative genetic testing.  This normal result is reassuring and indicates that it is unlikely Cynthia Bryan's cancer is due to a hereditary cause.  It is unlikely that there is an increased risk of another cancer due to a mutation in one of these genes.  However, genetic testing is not perfect, and cannot definitively rule out a hereditary cause.  It will be important for her to keep in contact with genetics to learn if any additional testing may be needed in the future.

## 2021-08-15 ENCOUNTER — Other Ambulatory Visit: Payer: Self-pay

## 2021-08-15 ENCOUNTER — Encounter: Payer: Self-pay | Admitting: Radiation Oncology

## 2021-08-15 ENCOUNTER — Ambulatory Visit
Admission: RE | Admit: 2021-08-15 | Discharge: 2021-08-15 | Disposition: A | Discharge status: Home / Self Care | Payer: Medicare HMO | Source: Ambulatory Visit | Attending: Radiation Oncology | Admitting: Radiation Oncology

## 2021-08-15 DIAGNOSIS — C20 Malignant neoplasm of rectum: Secondary | ICD-10-CM | POA: Diagnosis present

## 2021-08-15 DIAGNOSIS — Z510 Encounter for antineoplastic radiation therapy: Secondary | ICD-10-CM | POA: Diagnosis present

## 2021-08-15 LAB — RAD ONC ARIA SESSION SUMMARY
Course Elapsed Days: 43
Plan Fractions Treated to Date: 5
Plan Prescribed Dose Per Fraction: 1.8 Gy
Plan Total Fractions Prescribed: 5
Plan Total Prescribed Dose: 9 Gy
Reference Point Dosage Given to Date: 54 Gy
Reference Point Session Dosage Given: 1.8 Gy
Session Number: 30

## 2021-08-17 NOTE — Progress Notes (Signed)
                                                                                                                                                             Patient Name: Cynthia Bryan MRN: [id number] DOB: January 20, 1946 Referring Physician: Ileana Roup Date of Service: 08/15/2021 [place], [place]                                                        End Of Treatment Note  Diagnoses: C20-Malignant neoplasm of rectum C20-Malignant neoplasm of rectum  Cancer Staging:   Stage I, cT2N0M0 adenocarcinoma of the distal rectum, s/p transanal excision with invasion into the superficial muscularis propria  Intent: Curative  Radiation Treatment Dates: 07/03/2021 through 08/15/2021 Site Technique Total Dose (Gy) Dose per Fx (Gy) Completed Fx Beam Energies  Rectum: Rectum 3D 45/45 1.8 25/25 6X, 15X  Rectum: Rectum_Bst 3D 9/9 1.8 5/5 6X, 15X   Narrative: The patient tolerated radiation therapy relatively well. She developed fatigue, loose stool, and discomfort with passing stool.   Plan: The patient will receive a call in about one month from the radiation oncology department. She will continue follow up with Dr. Burr Medico as well as Dr. Dema Severin in Colorectal Surgery.   ________________________________________________    Carola Rhine, PAC

## 2021-08-29 ENCOUNTER — Inpatient Hospital Stay: Payer: Medicare HMO | Admitting: Hematology

## 2021-08-29 ENCOUNTER — Encounter: Payer: Self-pay | Admitting: Hematology

## 2021-08-29 ENCOUNTER — Inpatient Hospital Stay: Payer: Medicare HMO

## 2021-08-29 ENCOUNTER — Other Ambulatory Visit: Payer: Self-pay

## 2021-08-29 VITALS — BP 146/70 | HR 76 | Temp 98.7°F | Resp 18 | Ht 66.0 in | Wt 207.5 lb

## 2021-08-29 DIAGNOSIS — M8580 Other specified disorders of bone density and structure, unspecified site: Secondary | ICD-10-CM | POA: Diagnosis not present

## 2021-08-29 DIAGNOSIS — C20 Malignant neoplasm of rectum: Secondary | ICD-10-CM

## 2021-08-29 DIAGNOSIS — G8929 Other chronic pain: Secondary | ICD-10-CM | POA: Diagnosis not present

## 2021-08-29 DIAGNOSIS — K219 Gastro-esophageal reflux disease without esophagitis: Secondary | ICD-10-CM | POA: Diagnosis not present

## 2021-08-29 DIAGNOSIS — R21 Rash and other nonspecific skin eruption: Secondary | ICD-10-CM | POA: Diagnosis not present

## 2021-08-29 DIAGNOSIS — Z79899 Other long term (current) drug therapy: Secondary | ICD-10-CM | POA: Diagnosis not present

## 2021-08-29 DIAGNOSIS — M109 Gout, unspecified: Secondary | ICD-10-CM | POA: Diagnosis not present

## 2021-08-29 DIAGNOSIS — M1990 Unspecified osteoarthritis, unspecified site: Secondary | ICD-10-CM | POA: Diagnosis not present

## 2021-08-29 DIAGNOSIS — I10 Essential (primary) hypertension: Secondary | ICD-10-CM | POA: Diagnosis not present

## 2021-08-29 LAB — CBC WITH DIFFERENTIAL (CANCER CENTER ONLY)
Abs Immature Granulocytes: 0.01 10*3/uL (ref 0.00–0.07)
Basophils Absolute: 0 10*3/uL (ref 0.0–0.1)
Basophils Relative: 0 %
Eosinophils Absolute: 0.2 10*3/uL (ref 0.0–0.5)
Eosinophils Relative: 4 %
HCT: 35.3 % — ABNORMAL LOW (ref 36.0–46.0)
Hemoglobin: 12.1 g/dL (ref 12.0–15.0)
Immature Granulocytes: 0 %
Lymphocytes Relative: 14 %
Lymphs Abs: 0.8 10*3/uL (ref 0.7–4.0)
MCH: 35.2 pg — ABNORMAL HIGH (ref 26.0–34.0)
MCHC: 34.3 g/dL (ref 30.0–36.0)
MCV: 102.6 fL — ABNORMAL HIGH (ref 80.0–100.0)
Monocytes Absolute: 0.4 10*3/uL (ref 0.1–1.0)
Monocytes Relative: 8 %
Neutro Abs: 4.2 10*3/uL (ref 1.7–7.7)
Neutrophils Relative %: 74 %
Platelet Count: 237 10*3/uL (ref 150–400)
RBC: 3.44 MIL/uL — ABNORMAL LOW (ref 3.87–5.11)
RDW: 21.2 % — ABNORMAL HIGH (ref 11.5–15.5)
WBC Count: 5.7 10*3/uL (ref 4.0–10.5)
nRBC: 0 % (ref 0.0–0.2)

## 2021-08-29 LAB — CMP (CANCER CENTER ONLY)
ALT: 17 U/L (ref 0–44)
AST: 14 U/L — ABNORMAL LOW (ref 15–41)
Albumin: 4.2 g/dL (ref 3.5–5.0)
Alkaline Phosphatase: 83 U/L (ref 38–126)
Anion gap: 8 (ref 5–15)
BUN: 23 mg/dL (ref 8–23)
CO2: 27 mmol/L (ref 22–32)
Calcium: 9.9 mg/dL (ref 8.9–10.3)
Chloride: 103 mmol/L (ref 98–111)
Creatinine: 0.91 mg/dL (ref 0.44–1.00)
GFR, Estimated: >60 mL/min
Glucose, Bld: 125 mg/dL — ABNORMAL HIGH (ref 70–99)
Potassium: 3.6 mmol/L (ref 3.5–5.1)
Sodium: 138 mmol/L (ref 135–145)
Total Bilirubin: 0.7 mg/dL (ref 0.3–1.2)
Total Protein: 6.7 g/dL (ref 6.5–8.1)

## 2021-08-29 NOTE — Progress Notes (Addendum)
[facility]   Telephone:([phone]) [phone] [phone]) [phone]   Clinic Follow up Note   Patient Care Team: McLean-Scocuzza, Pasty Spillers, MD as PCP - General (Internal Medicine)  Date of Service:  08/29/2021  CHIEF COMPLAINT: f/u of rectal cancer  CURRENT THERAPY:  Surveillance  ASSESSMENT & PLAN:  Cynthia Bryan is a 76 y.o. female with   1. Adenocarcinoma of the distal rectum, G2, pT2, cN0M0 stage IA, MMR normal -positive routine Cologuard with PCP, she was asymptomatic. Colonoscopy on 04/17/21 showed 2 cm distal rectal mass that was palpable on DRE. Path confirmed invasive moderately differentiated adenocarcinoma. -local staging MRI on 04/28/21 showed cT2N0 disease without lymphovascular invasion -staging CT on 05/09/21 was negative for distant metastasis -upfront transanal excision on 05/26/21 with Dr. Cliffton Asters showed 2.5 cm tumor, negative margins, MMR normal -due to moderate risk of recurrence, she received adjuvant concurrent chemoRT with Xeloda on 07/03/21 - 08/15/21. She developed a rash to the back of her hands and chest, as well as diarrhea, otherwise tolerated well. -she is recovering well from chemoRT, notes her rash is almost gone. Labs reviewed, overall stable. -we discussed close surveillance-- plan for MRI every 6 months and anoscope with Dr. Cliffton Asters between MRI's. Will plan for post-treatment MRI in 3 months.  Colonoscopy due in April 2024   2. Co-morbidities: Factor V heterozygous, HTN, HL, chronic pain, osteopenia, arthritis, GERD, gout -Follow-up PCP, continue med regimen -On tramadol, Robaxin, and gabapentin for chronic pain -She required postop anticoagulation for orthopedic surgery in the past   3. Social, family history  -[name] lives independently with strong social/church support and is close to her nephew -previously discussed community resources including nutrition/dietitian, social work, Orthoptist if needed -she has strong family history of cancer including a  brother with bladder cancer, mother with breast cancer, and a paternal aunt with multiple cancers including CRC and breast.  -she underwent genetic testing on 07/03/21; results were negative.     PLAN: -f/u in 3 months with lab and pelvic MRI several days before   No problem-specific Assessment & Plan notes found for this encounter.   SUMMARY OF ONCOLOGIC HISTORY: Oncology History  Rectal cancer (HCC)  04/25/2021 Initial Biopsy   Colonoscopy with Dr. Norma Fredrickson in [place], [place] -Per the notes in epic, she was found to have an infiltrative, sessile, ulcerated nonobstructing mass in the distal rectum measuring 2 cm in length and diameter, 0 to 3 cm from the anal verge. 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   04/25/2021 Initial Diagnosis   Rectal cancer (HCC)   04/25/2021 Cancer Staging   Staging form: Colon and Rectum, AJCC 8th Edition - Clinical stage from 04/25/2021: Stage I (cT2, cN0, cM0) - Signed by [name], NP on 06/19/2021 Stage prefix: Initial diagnosis Total positive nodes: 0   04/25/2021 Tumor Marker   Baseline CEA: 1.8 (normal)   04/28/2021 Imaging   Local staging pelvic MRI: IMPRESSION: TUMOR DESCRIPTION Circumferential Extent: 100 degrees from 9 o'clock to 5 o'clock along the posterior RIGHT border of the rectum.   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 Lesion at anorectal junction.     05/09/2021 Imaging   Staging CT chest/abdomen IMPRESSION: 1. No evidence of metastatic disease within the chest or abdomen. 2. Hepatic steatosis. 3. Punctate nonobstructive left renal stone. 4. Bilateral renal cysts, including  a hemorrhagic/proteinaceous right upper pole renal cyst. 5.  Aortic Atherosclerosis (ICD10-I70.0).   05/26/2021 Surgery   Transanal excision of  distal rectal cancer by Dr. Marin Olp   05/26/2021 Pathology Results   A. RECTUM, RIGHT ANTERIOR LATERAL DISTAL, RESECTION:  - Invasive moderately differentiated adenocarcinoma, 2.5 cm  - Carcinoma focally invades into superficial muscularis propria  - Resection margins are negative for carcinoma  - Lateral mucosal margins are negative for dysplasia  - No evidence of lymphovascular invasion  - See oncology table  pT2, PN X IHC EXPRESSION RESULTS  TEST           RESULT  MLH1:          Preserved nuclear expression  MSH2:          Preserved nuclear expression  MSH6:          Preserved nuclear expression  PMS2:          Preserved nuclear expression     05/26/2021 Cancer Staging   Staging form: Colon and Rectum, AJCC 8th Edition - Pathologic stage from 05/26/2021: Stage Unknown (pT2, pNX, cM0) - Signed by [name], NP on 06/19/2021    Genetic Testing   Negative genetic testing. No pathogenic variants identified on the [employer] CustomNext+RNA panel. The report date is 07/14/2021.  The CustomNext-Cancer+RNAinsight panel offered by [employer] includes sequencing and rearrangement analysis for the following 47 genes:  APC, ATM, AXIN2, BARD1, BMPR1A, BRCA1, BRCA2, BRIP1, CDH1, CDK4, CDKN2A, CHEK2, DICER1, EPCAM, GREM1, HOXB13, MEN1, MLH1, MSH2, MSH3, MSH6, MUTYH, NBN, NF1, NF2, NTHL1, PALB2, PMS2, POLD1, POLE, PTEN, RAD51C, RAD51D, RECQL, RET, SDHA, SDHAF2, SDHB, SDHC, SDHD, SMAD4, SMARCA4, STK11, TP53, TSC1, TSC2, and VHL.  RNA data is routinely analyzed for use in variant interpretation for all genes.      INTERVAL HISTORY:  [name] is here for a follow up of rectal cancer. She was last seen by NP Lacie on 08/07/21. She presents to the clinic alone. She reports she is getting better off treatment.   All other systems were reviewed with the patient and are negative.  MEDICAL HISTORY:  Past Medical History:  Diagnosis Date   Arthritis    back knees, shoulders   Basal  cell carcinoma    right temple Dr. Guinevere Ferrari removed 01/2017    Chicken pox    as child   Chronic back pain    COVID-19 10/13/2019   scratchy throat chills fever x 6 weeks x 6 weeks all symptoms resolved   Family history of bladder cancer    Family history of breast cancer    Family history of lung cancer    Family history of stomach cancer    Fatty liver    09/19/15   Gastric ulcer    neg H pylori  diet controlled   GERD (gastroesophageal reflux disease)    Gout    Hiatal hernia    SMALL   History of kidney stones    Hyperlipidemia    Hypertension    Lumbar herniated disc    Rectal cancer (HCC)    Recurrent UTI    last uti 6 months ago per pt on 05-23-2021   Rosacea    Syncope none since 2018 per pt    per pt presyncope no LOC felt dizzy/vision distorted and couldnt speak EEG neg, CT head neg 07/31/16, f/u [facility] cardiology with US carotid, echo but no Holter    Umbilical hernia    noted 09/19/15  UTI (urinary tract infection)    Vitamin B 12 deficiency    Vitamin D deficiency    Wears glasses     SURGICAL HISTORY: Past Surgical History:  Procedure Laterality Date   BREAST BIOPSY Right 1980   cyst removed negative pathology    colonscopy  04/17/2021   [facility]   CYSTOSCOPY W/ RETROGRADES Left 02/18/2020   Procedure: CYSTOSCOPY WITH RETROGRADE PYELOGRAM;  Surgeon: Orson Ape, MD;  Location: [facility];  Service: Urology;  Laterality: Left;   EXTRACORPOREAL SHOCK WAVE LITHOTRIPSY Left 09/19/2017   Procedure: EXTRACORPOREAL SHOCK WAVE LITHOTRIPSY (ESWL);  Surgeon: Orson Ape, MD;  Location: [facility];  Service: Urology;  Laterality: Left;   JOINT REPLACEMENT     right knee 11/2015 Dr. Nat Math [facility] [place]   RECTAL EXAM UNDER ANESTHESIA N/A 05/26/2021   Procedure: ANORECTAL EXAM UNDER ANESTHESIA;  Surgeon: Andria Meuse, MD;  Location: [facility] [place];  Service: General;  Laterality: N/A;   TRANSANAL EXCISION OF RECTAL MASS N/A 05/26/2021    Procedure: TRANSANAL EXCISION OF RIGHT ANTERIOR LATERAL DISTAL RECTAL CANCER;  Surgeon: Andria Meuse, MD;  Location: [facility];  Service: General;  Laterality: N/A;    I have reviewed the social history and family history with the patient and they are unchanged from previous note.  ALLERGIES:  is allergic to nortriptyline and sulfa antibiotics.  MEDICATIONS:  Current Outpatient Medications  Medication Sig Dispense Refill   acetaminophen (TYLENOL) 500 MG tablet Take 1,000 mg by mouth 2 (two) times daily.      allopurinol (ZYLOPRIM) 300 MG tablet Take 1 tablet (300 mg total) by mouth daily. (Patient not taking: Reported on 06/21/2021) 90 tablet 3   amLODipine (NORVASC) 2.5 MG tablet Take 1-2 tablets (2.5-5 mg total) by mouth daily. 180 tablet 3   atorvastatin (LIPITOR) 40 MG tablet Take 1 tablet (40 mg total) by mouth daily at 6 PM. 90 tablet 3   Cholecalciferol (VITAMIN D3) 5000 units CAPS Take 5,000 Units by mouth daily.      Cyanocobalamin (B-12) 5000 MCG CAPS Take 5,000 mcg by mouth daily.      ezetimibe (ZETIA) 10 MG tablet Take 1 tablet (10 mg total) by mouth at bedtime. 90 tablet 3   febuxostat (ULORIC) 40 MG tablet Take 1 tablet (40 mg total) by mouth daily. Hold allopurinol 90 tablet 3   gabapentin (NEURONTIN) 300 MG capsule Take 1 capsule (300 mg total) by mouth at bedtime. 90 capsule 3   ketoconazole (NIZORAL) 2 % cream Apply 1 application topically 2 (two) times daily as needed for irritation. FACE 30 g 11   Magnesium 500 MG CAPS Take 500 mg by mouth at bedtime.      methocarbamol (ROBAXIN) 750 MG tablet Take 1 tablet (750 mg total) by mouth at bedtime. 90 tablet 3   omeprazole (PRILOSEC) 20 MG capsule Take 1 capsule (20 mg total) by mouth daily. 30 min before food 90 capsule 3   omeprazole (PRILOSEC) 20 MG capsule Take 20 mg by mouth daily.     ondansetron (ZOFRAN) 4 MG tablet Take 1-2 tablets (4-8 mg total) by mouth every 8 (eight) hours as needed for  nausea or vomiting. 30 tablet 2   potassium chloride (MICRO-K) 10 MEQ CR capsule Take 3 capsules (30 mEq total) by mouth every morning. 360 capsule 3   traMADol (ULTRAM) 50 MG tablet Takes 1 tab in am , 2 at hs 170 tablet 5  triamcinolone ointment (KENALOG) 0.5 % Apply 1 application. topically 2 (two) times daily. 30 g 1   triamterene-hydrochlorothiazide (MAXZIDE-25) 37.5-25 MG tablet Take 1 tablet by mouth every morning. 90 tablet 3   No current facility-administered medications for this visit.    PHYSICAL EXAMINATION: ECOG PERFORMANCE STATUS: 1 - Symptomatic but completely ambulatory  Vitals:   08/29/21 1012  BP: (!) 146/70  Pulse: 76  Resp: 18  Temp: 98.7 F (37.1 C)  SpO2: 97%   Wt Readings from Last 3 Encounters:  08/29/21 207 lb 8 oz (94.1 kg)  08/07/21 209 lb 9.6 oz (95.1 kg)  08/01/21 213 lb 3.2 oz (96.7 kg)     GENERAL:alert, no distress and comfortable SKIN: skin color normal, no rashes or significant lesions EYES: normal, Conjunctiva are pink and non-injected, sclera clear  NEURO: alert & oriented x 3 with fluent speech  LABORATORY DATA:  I have reviewed the data as listed    Latest Ref Rng & Units 08/29/2021    9:20 AM 08/07/2021   10:08 AM 08/01/2021   10:42 AM  CBC  WBC 4.0 - 10.5 K/uL 5.7  6.4  7.1   Hemoglobin 12.0 - 15.0 g/dL 16.1  09.6  04.5   Hematocrit 36.0 - 46.0 % 35.3  36.0  36.0   Platelets 150 - 400 K/uL 237  245  259         Latest Ref Rng & Units 08/29/2021    9:20 AM 08/07/2021   10:08 AM 08/01/2021   10:42 AM  CMP  Glucose 70 - 99 mg/dL 409  811  914   BUN 8 - 23 mg/dL 23  17  18    Creatinine 0.44 - 1.00 mg/dL 7.82  9.56  2.13   Sodium 135 - 145 mmol/L 138  139  136   Potassium 3.5 - 5.1 mmol/L 3.6  3.3  3.3   Chloride 98 - 111 mmol/L 103  98  97   CO2 22 - 32 mmol/L 27  32  29   Calcium 8.9 - 10.3 mg/dL 9.9  08.6  57.8   Total Protein 6.5 - 8.1 g/dL 6.7  6.8  7.3   Total Bilirubin 0.3 - 1.2 mg/dL 0.7  0.8  0.9   Alkaline Phos 38 -  126 U/L 83  81  86   AST 15 - 41 U/L 14  16  17    ALT 0 - 44 U/L 17  17  19        RADIOGRAPHIC STUDIES: I have personally reviewed the radiological images as listed and agreed with the findings in the report. No results found.    Orders Placed This Encounter  Procedures   MR PELVIS WO CM RECTAL CA STAGING    Standing Status:   Future    Standing Expiration Date:   08/30/2022    Order Specific Question:   If indicated for the ordered procedure, I authorize the administration of contrast media per Radiology protocol    Answer:   Yes    Order Specific Question:   What is the patient's sedation requirement?    Answer:   No Sedation    Order Specific Question:   Does the patient have a pacemaker or implanted devices?    Answer:   No    Order Specific Question:   Preferred imaging location?    Answer:   [facility] (table limit - 550 lbs)    Order Specific Question:   Release to patient  Answer:   Immediate   All questions were answered. The patient knows to call the clinic with any problems, questions or concerns. No barriers to learning was detected. The total time spent in the appointment was 30 minutes.     Malachy Mood, MD 08/29/2021   I, Mickie Bail, am acting as scribe for Malachy Mood, MD.   I have reviewed the above documentation for accuracy and completeness, and I agree with the above.

## 2021-08-31 ENCOUNTER — Telehealth: Payer: Self-pay | Admitting: Hematology

## 2021-08-31 NOTE — Telephone Encounter (Signed)
Scheduled follow-up appointments per 6/27 los. Patient is aware. 

## 2021-09-18 ENCOUNTER — Ambulatory Visit
Admission: RE | Admit: 2021-09-18 | Discharge: 2021-09-18 | Disposition: A | Discharge status: Home / Self Care | Payer: Medicare HMO | Source: Ambulatory Visit | Attending: Hematology | Admitting: Hematology

## 2021-09-18 DIAGNOSIS — C20 Malignant neoplasm of rectum: Secondary | ICD-10-CM | POA: Insufficient documentation

## 2021-09-18 NOTE — Progress Notes (Signed)
" °  Radiation Oncology         [phone] ________________________________  Name: Cynthia Bryan MRN: [id number]  Date of Service: 09/18/2021  DOB: 03-Mar-1946  Post Treatment Telephone Note  Diagnosis:   Stage I, cT2N0M0 adenocarcinoma of the distal rectum, s/p transanal excision with invasion into the superficial muscularis propria  Intent: Curative  Radiation Treatment Dates: 07/03/2021 through 08/15/2021 Site Technique Total Dose (Gy) Dose per Fx (Gy) Completed Fx Beam Energies  Rectum: Rectum 3D 45/45 1.8 25/25 6X, 15X  Rectum: Rectum_Bst 3D 9/9 1.8 5/5 6X, 15X   Narrative: The patient tolerated radiation therapy relatively well. She developed fatigue, loose stool, and discomfort with passing stool.    Impression/Plan: 1. Stage I, cT2N0M0 adenocarcinoma of the distal rectum, s/p transanal excision with invasion into the superficial muscularis propria.  I was unable to reach the patient but left a voicemail and on the message, I discussed that we would be happy to continue to follow her as needed, but she will also continue to follow up with Dr. Lanny in medical oncology and Dr. Teresa in Colorectal Surgery.       Donald KYM Husband, PAC       "

## 2021-10-18 ENCOUNTER — Ambulatory Visit (INDEPENDENT_AMBULATORY_CARE_PROVIDER_SITE_OTHER): Payer: Medicare HMO | Admitting: Family

## 2021-10-18 ENCOUNTER — Encounter: Payer: Self-pay | Admitting: Family

## 2021-10-18 VITALS — BP 120/64 | HR 63 | Temp 97.7°F | Ht 66.0 in | Wt 204.6 lb

## 2021-10-18 DIAGNOSIS — C20 Malignant neoplasm of rectum: Secondary | ICD-10-CM | POA: Diagnosis not present

## 2021-10-18 DIAGNOSIS — R300 Dysuria: Secondary | ICD-10-CM

## 2021-10-18 DIAGNOSIS — R399 Unspecified symptoms and signs involving the genitourinary system: Secondary | ICD-10-CM | POA: Diagnosis not present

## 2021-10-18 LAB — POCT URINALYSIS DIPSTICK
Glucose, UA: NEGATIVE
Ketones, UA: NEGATIVE
Nitrite, UA: POSITIVE
Protein, UA: NEGATIVE
Spec Grav, UA: 1.025
Urobilinogen, UA: 0.2 U/dL
pH, UA: 5.5

## 2021-10-18 MED ORDER — NITROFURANTOIN MONOHYD MACRO 100 MG PO CAPS: 100.0000 mg | ORAL_CAPSULE | Freq: Two times a day (BID) | ORAL | 0 refills | Status: DC

## 2021-10-18 NOTE — Progress Notes (Signed)
Subjective:    Patient ID: Cynthia Bryan, female    DOB: 07-Apr-1945, 76 y.o.   MRN: [id number]  CC: CHARISMA CHARLOT is a 76 y.o. female who presents today for an acute visit.    HPI: Complains of dysuria, urinary frequency for the past couple of days  No fever, chills, nausea vomiting hematuria.  Dull ache right low back.  H/o renal stone  CT a/p 05/2021 showed punctate nonobstructive left renal stone.   H/o HTN, rectal cancer ( following with Dr Burr Medico with chemoRT), factor V leiden  No h/o ckd  Last documented 75month ago, proteus mirabilis; treated with cipro at that time.   HISTORY:  Past Medical History:  Diagnosis Date   Arthritis    back knees, shoulders   Basal cell carcinoma    right temple Dr. AElmer Rampremoved 01/2017    Chicken pox    as child   Chronic back pain    COVID-19 10/13/2019   scratchy throat chills fever x 6 weeks x 6 weeks all symptoms resolved   Family history of bladder cancer    Family history of breast cancer    Family history of lung cancer    Family history of stomach cancer    Fatty liver    09/19/15   Gastric ulcer    neg H pylori  diet controlled   GERD (gastroesophageal reflux disease)    Gout    Hiatal hernia    SMALL   History of kidney stones    Hyperlipidemia    Hypertension    Lumbar herniated disc    [place]    Recurrent UTI    last uti 6 months ago per pt on 05-23-2021   Rosacea    Syncope none since 2018 per pt    per pt presyncope no LOC felt dizzy/vision distorted and couldnt speak EEG neg, CT head neg 07/31/16, f/u [facility]ology with UKoreacarotid, echo but no Holter    Umbilical hernia    noted 09/19/15    UTI (urinary tract infection)    Vitamin B 12 deficiency    Vitamin D deficiency    Wears glasses    Past Surgical History:  Procedure Laterality Date   BREAST BIOPSY Right 1980   cyst removed negative pathology    [place]  [place]20[place]    Procedure: CYSTOSCOPY WITH RETROGRADE PYELOGRAM;  Surgeon: WRoyston Cowper MD;  Location: [facility];  Service: Urology;  Laterality: Left;   EXTRACORPOREAL SHOCK WAVE LITHOTRIPSY Left 09/19/2017   Procedure: EXTRACORPOREAL SHOCK WAVE LITHOTRIPSY (ESWL);  Surgeon: WRoyston Cowper MD;  Location: [facility];  Service: Urology;  Laterality: Left;   JOINT REPLACEMENT     [facility] 05/26/2021   Procedure: ANORECTAL EXAM UNDER ANESTHESIA;  Surgeon: WIleana Roup MD;  [place]  Service: General;  Laterality: N/A;   TRANSANAL EXCISION OF RECTAL MASS N/A 05/26/2021   Procedure: TRANSANAL EXCISION OF RIGHT ANTERIOR LATERAL DISTAL RECTAL CANCER;  Surgeon: WIleana Roup MD;  [place]  Service: General;  Laterality: N/A;   Family History  Problem Relation Age of Onset   Stroke Mother    Breast cancer Mother 769  Cancer Mother        breast cancer s/p masectomy lived to be 967  Hypertension Mother    Cancer Father        NMSC skin cancer    Gout Father    Heart disease Father        s/p bypass surgery age 87 lived 64 months after surgery    Kidney disease Father        RAS>kidney failure    Bladder Cancer Brother    Hyperlipidemia Paternal Aunt    Stroke Paternal Aunt    Breast cancer Paternal Aunt    Rectal cancer Paternal Aunt    Kidney cancer Neg Hx    Prolactinoma Neg Hx     Allergies: Nortriptyline and Sulfa antibiotics Current Outpatient Medications on File Prior to Visit  Medication Sig Dispense Refill   acetaminophen (TYLENOL) 500 MG tablet Take 1,000 mg by mouth 2 (two) times daily.      allopurinol (ZYLOPRIM) 300 MG tablet Take 1 tablet (300 mg total) by mouth daily. 90 tablet 3   amLODipine (NORVASC) 2.5 MG tablet Take 1-2 tablets (2.5-5 mg total) by mouth daily. 180 tablet 3   atorvastatin (LIPITOR) 40 MG tablet Take 1 tablet (40 mg  total) by mouth daily at 6 PM. 90 tablet 3   Cholecalciferol (VITAMIN D3) 5000 units CAPS Take 5,000 Units by mouth daily.      Cyanocobalamin (B-12) 5000 MCG CAPS Take 5,000 mcg by mouth daily.      ezetimibe (ZETIA) 10 MG tablet Take 1 tablet (10 mg total) by mouth at bedtime. 90 tablet 3   gabapentin (NEURONTIN) 300 MG capsule Take 1 capsule (300 mg total) by mouth at bedtime. 90 capsule 3   ketoconazole (NIZORAL) 2 % cream Apply 1 application topically 2 (two) times daily as needed for irritation. FACE 30 g 11   Magnesium 500 MG CAPS Take 500 mg by mouth at bedtime.      methocarbamol (ROBAXIN) 750 MG tablet Take 1 tablet (750 mg total) by mouth at bedtime. 90 tablet 3   omeprazole (PRILOSEC) 20 MG capsule Take 1 capsule (20 mg total) by mouth daily. 30 min before food 90 capsule 3   potassium chloride (MICRO-K) 10 MEQ CR capsule Take 3 capsules (30 mEq total) by mouth every morning. 360 capsule 3   traMADol (ULTRAM) 50 MG tablet Takes 1 tab in am , 2 at hs 170 tablet 5   triamcinolone ointment (KENALOG) 0.5 % Apply 1 application. topically 2 (two) times daily. 30 g 1   triamterene-hydrochlorothiazide (MAXZIDE-25) 37.5-25 MG tablet Take 1 tablet by mouth every morning. 90 tablet 3   febuxostat (ULORIC) 40 MG tablet Take 1 tablet (40 mg total) by mouth daily. Hold allopurinol (Patient not taking: Reported on 10/18/2021) 90 tablet 3   ondansetron (ZOFRAN) 4 MG tablet Take 1-2 tablets (4-8 mg total) by mouth every 8 (eight) hours as needed for nausea or vomiting. (Patient not taking: Reported on 10/18/2021) 30 tablet 2   No current facility-administered medications on file prior to visit.    Social History   Tobacco Use   Smoking status: Former    Packs/day: 1.00    Years: 25.00    Total pack years: 25.00    Types: Cigarettes    Quit date: 02/08/1994    Years since quitting: 27.7   Smokeless tobacco: Never   Tobacco comments:    smoked total 30 years quit 20 years ago from 2019 max 1 ppd  no FH lung cancer   Vaping Use   Vaping Use: Never used  Substance Use Topics   Alcohol use: No   Drug use: Never    Review of Systems  Constitutional:  Negative for chills and fever.  Respiratory:  Negative for cough.   Cardiovascular:  Negative for chest pain and palpitations.  Gastrointestinal:  Negative for abdominal pain, nausea and vomiting.  Genitourinary:  Positive for dysuria. Negative for hematuria.      Objective:    BP 120/64 (BP Location: Left Arm, Patient Position: Sitting, Cuff Size: Large)   Pulse 63   Temp 97.7 F (36.5 C) (Oral)   Ht '5\' 6"'$  (1.676 m)   Wt 204 lb 9.6 oz (92.8 kg)   SpO2 97%   BMI 33.02 kg/m    Physical Exam Vitals reviewed.  Constitutional:      Appearance: She is well-developed.  Cardiovascular:     Rate and Rhythm: Normal rate and regular rhythm.     Pulses: Normal pulses.     Heart sounds: Normal heart sounds.  Pulmonary:     Effort: Pulmonary effort is normal.     Breath sounds: Normal breath sounds. No wheezing, rhonchi or rales.  Skin:    General: Skin is warm and dry.  Neurological:     Mental Status: She is alert.  Psychiatric:        Speech: Speech normal.        Behavior: Behavior normal.        Thought Content: Thought content normal.        Assessment & Plan:   Problem List Items Addressed This Visit       Other   Dysuria    Point-of-care urine is positive for leukocytes nitrites, RBCs.  Patient is fortunately afebrile and nontoxic in appearance.  Negative CVA tenderness or back pain exhibited on exam .  we jointly agreed based on symptoms, and urine results at this time to go ahead and start Macrobid empirically while we await urine culture.  Encouraged water, probiotics.  She will let me know how she is doing      Other Visit Diagnoses     UTI symptoms    -  Primary   Relevant Medications   nitrofurantoin, macrocrystal-monohydrate, (MACROBID) 100 MG capsule   Other Relevant Orders   POCT Urinalysis  Dipstick   Urine Culture   Urinalysis, Routine w reflex microscopic         I am having Cynthia Bryan start on nitrofurantoin (macrocrystal-monohydrate). I am also having her maintain her Magnesium, B-12, Vitamin D3, acetaminophen, ketoconazole, allopurinol, febuxostat, triamcinolone ointment, omeprazole, triamterene-hydrochlorothiazide, traMADol, potassium chloride, methocarbamol, gabapentin, ezetimibe, atorvastatin, amLODipine, and ondansetron.   Meds ordered this encounter  Medications   nitrofurantoin, macrocrystal-monohydrate, (MACROBID) 100 MG capsule    Sig: Take 1 capsule (100 mg total) by mouth 2 (two) times daily. Take with food.    Dispense:  10 capsule    Refill:  0    Order Specific Question:   Supervising Provider    Answer:   Crecencio Mc [[id number]]    Return precautions given.   Risks, benefits, and alternatives of the medications and treatment plan prescribed today were discussed, and patient expressed understanding.   Education regarding symptom management and diagnosis given to patient on AVS.  Continue to follow with McLean-Scocuzza, Nino Glow, MD for routine health maintenance.   Cynthia Bryan and I agreed with plan.   Mable Paris, FNP

## 2021-10-18 NOTE — Patient Instructions (Signed)
Start Macrobid (antibiotic)   Ensure to take probiotics while on antibiotics and also for 2 weeks after completion. This can either be by eating yogurt daily or taking a probiotic supplement over the counter such as Culturelle.It is important to re-colonize the gut with good bacteria and also to prevent any diarrheal infections associated with antibiotic use.   We will await urine culture. Please let me know how you are doing.

## 2021-10-18 NOTE — Assessment & Plan Note (Signed)
Point-of-care urine is positive for leukocytes nitrites, RBCs.  Patient is fortunately afebrile and nontoxic in appearance.  Negative CVA tenderness or back pain exhibited on exam .  we jointly agreed based on symptoms, and urine results at this time to go ahead and start Macrobid empirically while we await urine culture.  Encouraged water, probiotics.  She will let me know how she is doing

## 2021-10-19 ENCOUNTER — Telehealth: Payer: Self-pay | Admitting: Internal Medicine

## 2021-10-19 DIAGNOSIS — R300 Dysuria: Secondary | ICD-10-CM

## 2021-10-19 LAB — URINALYSIS, ROUTINE W REFLEX MICROSCOPIC
Bilirubin Urine: NEGATIVE
Ketones, ur: NEGATIVE
Nitrite: POSITIVE — AB
Specific Gravity, Urine: 1.02 (ref 1.000–1.030)
Total Protein, Urine: >=300 — AB
Urine Glucose: NEGATIVE
Urobilinogen, UA: 0.2 (ref 0.0–1.0)
pH: 6 (ref 5.0–8.0)

## 2021-10-19 NOTE — Telephone Encounter (Signed)
Patient saw [facility] for a UTI. nitrofurantoin, macrocrystal-monohydrate, (MACROBID) 100 MG capsule was prescribed. Patient states the medication is giving her terrible diarrhea. She states she does better on  Keflex.

## 2021-10-20 ENCOUNTER — Telehealth: Payer: Self-pay | Admitting: Internal Medicine

## 2021-10-20 DIAGNOSIS — N390 Urinary tract infection, site not specified: Secondary | ICD-10-CM | POA: Diagnosis not present

## 2021-10-20 DIAGNOSIS — R300 Dysuria: Secondary | ICD-10-CM | POA: Diagnosis not present

## 2021-10-20 DIAGNOSIS — R110 Nausea: Secondary | ICD-10-CM | POA: Diagnosis not present

## 2021-10-20 DIAGNOSIS — M5489 Other dorsalgia: Secondary | ICD-10-CM | POA: Diagnosis not present

## 2021-10-20 MED ORDER — CEPHALEXIN 500 MG PO CAPS: 500.0000 mg | ORAL_CAPSULE | Freq: Two times a day (BID) | ORAL | 0 refills | Status: AC

## 2021-10-20 NOTE — Telephone Encounter (Signed)
Spoke to patient this am and she is having some chills and nausea but has appt with Dr. Eliberto Ivory this morning. Patient stated that she would like the Keflex called in to the [facility] [place] if possible.

## 2021-10-20 NOTE — Telephone Encounter (Signed)
Patient called office this morning, no one called her yesterday. She feels like the medication is not working and is afraid it will turn into septis. Please call her.

## 2021-10-20 NOTE — Telephone Encounter (Signed)
Call patient Please triage for fever or chills, nausea vomit I was hoping that we would have the result of the urine culture with sensitivity by this morning, currently we do not.  She may certainly stop Macrobid if causing diarrhea. I have sent in keflex to start  Please remind her of the importance of probiotics as she has been on 2 different antibiotics to risk of diarrhea.    we will certainly await urine culture and if antibiotic needs to be changed, we will call her.

## 2021-10-20 NOTE — Addendum Note (Signed)
Addended by: Burnard Hawthorne on: 10/20/2021 08:40 AM   Modules accepted: Orders

## 2021-10-20 NOTE — Telephone Encounter (Signed)
Angie from [facility] need office notes from 8/16 regarding UTI appointment. They cant see pt without the notes Fax [phone]

## 2021-10-21 LAB — URINE CULTURE
MICRO NUMBER:: 13787867
SPECIMEN QUALITY:: ADEQUATE

## 2021-10-23 NOTE — Telephone Encounter (Signed)
Faxed on 8/18

## 2021-10-23 NOTE — Telephone Encounter (Signed)
Noted.  See result note.  

## 2021-10-24 ENCOUNTER — Other Ambulatory Visit: Payer: Self-pay

## 2021-10-24 DIAGNOSIS — N390 Urinary tract infection, site not specified: Secondary | ICD-10-CM

## 2021-10-28 DIAGNOSIS — N390 Urinary tract infection, site not specified: Secondary | ICD-10-CM | POA: Diagnosis not present

## 2021-10-28 DIAGNOSIS — R399 Unspecified symptoms and signs involving the genitourinary system: Secondary | ICD-10-CM | POA: Diagnosis not present

## 2021-11-07 ENCOUNTER — Encounter: Payer: Self-pay | Admitting: Family

## 2021-11-07 ENCOUNTER — Ambulatory Visit (INDEPENDENT_AMBULATORY_CARE_PROVIDER_SITE_OTHER): Payer: Medicare HMO | Admitting: Family

## 2021-11-07 VITALS — BP 130/80 | HR 81 | Temp 97.8°F | Ht 66.0 in | Wt 195.0 lb

## 2021-11-07 DIAGNOSIS — N390 Urinary tract infection, site not specified: Secondary | ICD-10-CM | POA: Diagnosis not present

## 2021-11-07 DIAGNOSIS — R300 Dysuria: Secondary | ICD-10-CM | POA: Diagnosis not present

## 2021-11-07 LAB — URINALYSIS, ROUTINE W REFLEX MICROSCOPIC
Bilirubin Urine: NEGATIVE
Hgb urine dipstick: NEGATIVE
Ketones, ur: NEGATIVE
Nitrite: NEGATIVE
RBC / HPF: NONE SEEN
Specific Gravity, Urine: 1.025 (ref 1.000–1.030)
Urine Glucose: NEGATIVE
Urobilinogen, UA: 0.2 (ref 0.0–1.0)
pH: 5 (ref 5.0–8.0)

## 2021-11-07 NOTE — Assessment & Plan Note (Signed)
Reviewed urinalysis 10/28/2021.  Urine culture without growth.  She is completed ciprofloxacin 500 mg twice daily x7 days.  She continues to have a scant dysuria.  Pending repeat urinalysis, urine culture.  I have also placed a referral to urology for second opinion regarding urinary tract infection.

## 2021-11-07 NOTE — Progress Notes (Signed)
Subjective:    Patient ID: Cynthia Bryan, female    DOB: 1945/06/13, 76 y.o.   MRN: [id number]  CC: Cynthia Bryan is a 76 y.o. female who presents today for follow up.   HPI: She had seen Dr Eliberto Ivory urology, [place] who started her on augmentin '500mg'$  BID 10/20/21 due to continued dysuria.   She is feeling better today. She has finished cipro 4 days ago. Dysuria has marked improved.  No fever, hematuria.      Seen 10/28/2021 UTI and treated with ciprofloxacin '500mg'$  BID x 7 days. UA shows cloudy, leukocyte esterase, mucus, white blood cells, red blood cells. Urine culture no growth Previously seen 10/18/2021 in which I started nitrofurantoin. She never started keflex.  UA showed leukocytes, nitrates, white blood cells, red blood cells, protein.  Urine culture showed E. coli 10/18/2021  History of kidney stones Former smoker Rectal cancer and followsing with [facility] Dr Dema Severin, general surgery  Last documented UTI, proteus mirabilis 9 months ago.  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 Elmer Ramp removed 01/2017    Chicken pox    as child   Chronic back pain    COVID-19 10/13/2019   scratchy throat chills fever x 6 weeks x 6 weeks all symptoms resolved   Family history of bladder cancer    Family history of breast cancer    Family history of lung cancer    Family history of stomach cancer    Fatty liver    09/19/15   Gastric ulcer    neg H pylori  diet controlled   GERD (gastroesophageal reflux disease)    Gout    Hiatal hernia    SMALL   History of kidney stones    Hyperlipidemia    Hypertension    Lumbar herniated disc    Rectal cancer ([place])    Recurrent UTI    last uti 6 months ago per pt on 05-23-2021   Rosacea    Syncope  none since 2018 per pt    per pt presyncope no LOC felt dizzy/vision distorted and couldnt speak EEG neg, CT head neg 07/31/16, f/u [facility] cardiology with US carotid, echo but no Holter    Umbilical hernia    noted 09/19/15    UTI (urinary tract infection)    Vitamin B 12 deficiency    Vitamin D deficiency    Wears glasses    Past Surgical History:  Procedure Laterality Date   BREAST BIOPSY Right 1980   cyst removed negative pathology    colonscopy  04/17/2021   at [place] Left 02/18/2020   Procedure: CYSTOSCOPY WITH RETROGRADE PYELOGRAM;  Surgeon: Royston Cowper, MD;  Location: [facility];  Service: Urology;  Laterality: Left;   EXTRACORPOREAL SHOCK WAVE LITHOTRIPSY Left 09/19/2017   Procedure: EXTRACORPOREAL SHOCK WAVE LITHOTRIPSY (ESWL);  Surgeon: Royston Cowper, MD;  Location: [facility];  Service: Urology;  Laterality: Left;   JOINT REPLACEMENT     right knee 11/2015 Dr. Janell Quiet [facility] N/A 05/26/2021   Procedure: ANORECTAL EXAM UNDER ANESTHESIA;  Surgeon: Ileana Roup, MD;  Location: [facility];  Service: General;  Laterality: N/A;   TRANSANAL EXCISION OF RECTAL MASS N/A 05/26/2021   Procedure: TRANSANAL EXCISION OF RIGHT ANTERIOR LATERAL DISTAL RECTAL CANCER;  Surgeon: Ileana Roup, MD;  Location: [place];  Service: General;  Laterality: N/A;   Family History  Problem Relation Age of Onset   Stroke Mother    Breast cancer Mother 12   Cancer Mother        breast cancer s/p masectomy lived to be 13    Hypertension Mother    Cancer Father        NMSC skin cancer    Gout Father    Heart disease Father        s/p bypass surgery age 59 lived 29 months after surgery    Kidney disease Father        RAS>kidney failure    Bladder Cancer Brother    Hyperlipidemia Paternal Aunt    Stroke Paternal Aunt    Breast cancer Paternal Aunt    Rectal cancer Paternal Aunt     Kidney cancer Neg Hx    Prolactinoma Neg Hx     Allergies: Nortriptyline and Sulfa antibiotics Current Outpatient Medications on File Prior to Visit  Medication Sig Dispense Refill   acetaminophen (TYLENOL) 500 MG tablet Take 1,000 mg by mouth 2 (two) times daily.      allopurinol (ZYLOPRIM) 300 MG tablet Take 1 tablet (300 mg total) by mouth daily. 90 tablet 3   amLODipine (NORVASC) 2.5 MG tablet Take 1-2 tablets (2.5-5 mg total) by mouth daily. 180 tablet 3   atorvastatin (LIPITOR) 40 MG tablet Take 1 tablet (40 mg total) by mouth daily at 6 PM. 90 tablet 3   Cholecalciferol (VITAMIN D3) 5000 units CAPS Take 5,000 Units by mouth daily.      Cyanocobalamin (B-12) 5000 MCG CAPS Take 5,000 mcg by mouth daily.      ezetimibe (ZETIA) 10 MG tablet Take 1 tablet (10 mg total) by mouth at bedtime. 90 tablet 3   gabapentin (NEURONTIN) 300 MG capsule Take 1 capsule (300 mg total) by mouth at bedtime. 90 capsule 3   ketoconazole (NIZORAL) 2 % cream Apply 1 application topically 2 (two) times daily as needed for irritation. FACE 30 g 11   Magnesium 500 MG CAPS Take 500 mg by mouth at bedtime.      methocarbamol (ROBAXIN) 750 MG tablet Take 1 tablet (750 mg total) by mouth at bedtime. 90 tablet 3   omeprazole (PRILOSEC) 20 MG capsule Take 1 capsule (20 mg total) by mouth daily. 30 min before food 90 capsule 3   potassium chloride (MICRO-K) 10 MEQ CR capsule Take 3 capsules (30 mEq total) by mouth every morning. 360 capsule 3   traMADol (ULTRAM) 50 MG tablet Takes 1 tab in am , 2 at hs 170 tablet 5   triamcinolone ointment (KENALOG) 0.5 % Apply 1 application. topically 2 (two) times daily. 30 g 1   triamterene-hydrochlorothiazide (MAXZIDE-25) 37.5-25 MG tablet Take 1 tablet by mouth every morning. 90 tablet 3   febuxostat (ULORIC) 40 MG tablet Take 1 tablet (40 mg total) by mouth daily. Hold allopurinol (Patient not taking: Reported on 10/18/2021) 90 tablet 3   nitrofurantoin, macrocrystal-monohydrate,  (MACROBID) 100 MG capsule Take 1 capsule (100 mg total) by mouth 2 (two) times daily. Take with food. (Patient not taking: Reported on 11/07/2021) 10 capsule 0   ondansetron (ZOFRAN) 4 MG tablet Take 1-2 tablets (4-8 mg total) by mouth  every 8 (eight) hours as needed for nausea or vomiting. (Patient not taking: Reported on 10/18/2021) 30 tablet 2   No current facility-administered medications on file prior to visit.    Social History   Tobacco Use   Smoking status: Former    Packs/day: 1.00    Years: 25.00    Total pack years: 25.00    Types: Cigarettes    Quit date: 02/08/1994    Years since quitting: 27.7   Smokeless tobacco: Never   Tobacco comments:    smoked total 30 years quit 20 years ago from 2019 max 1 ppd no FH lung cancer   Vaping Use   Vaping Use: Never used  Substance Use Topics   Alcohol use: No   Drug use: Never    Review of Systems  Constitutional:  Negative for chills and fever.  Respiratory:  Negative for cough.   Cardiovascular:  Negative for chest pain and palpitations.  Gastrointestinal:  Negative for nausea and vomiting.  Genitourinary:  Negative for flank pain and hematuria.      Objective:    BP 130/80   Pulse 81   Temp 97.8 F (36.6 C) (Oral)   Ht '5\' 6"'$  (1.676 m)   Wt 195 lb (88.5 kg)   SpO2 96%   BMI 31.47 kg/m  BP Readings from Last 3 Encounters:  11/07/21 130/80  10/18/21 120/64  08/29/21 (!) 146/70   Wt Readings from Last 3 Encounters:  11/07/21 195 lb (88.5 kg)  10/18/21 204 lb 9.6 oz (92.8 kg)  08/29/21 207 lb 8 oz (94.1 kg)    Physical Exam Vitals reviewed.  Constitutional:      Appearance: She is well-developed.  Cardiovascular:     Rate and Rhythm: Normal rate and regular rhythm.     Pulses: Normal pulses.     Heart sounds: Normal heart sounds.  Pulmonary:     Effort: Pulmonary effort is normal.     Breath sounds: Normal breath sounds. No wheezing, rhonchi or rales.  Skin:    General: Skin is warm and dry.   Neurological:     Mental Status: She is alert.  Psychiatric:        Speech: Speech normal.        Behavior: Behavior normal.        Thought Content: Thought content normal.        Assessment & Plan:   Problem List Items Addressed This Visit       Genitourinary   Recurrent UTI - Primary   Relevant Orders   Ambulatory referral to Urology   Urine Culture   Urinalysis     Other   Dysuria    Reviewed urinalysis 10/28/2021.  Urine culture without growth.  She is completed ciprofloxacin 500 mg twice daily x7 days.  She continues to have a scant dysuria.  Pending repeat urinalysis, urine culture.  I have also placed a referral to urology for second opinion regarding urinary tract infection.        I am having Cynthia Bryan maintain her Magnesium, B-12, Vitamin D3, acetaminophen, ketoconazole, allopurinol, febuxostat, triamcinolone ointment, omeprazole, triamterene-hydrochlorothiazide, traMADol, potassium chloride, methocarbamol, gabapentin, ezetimibe, atorvastatin, amLODipine, ondansetron, and nitrofurantoin (macrocrystal-monohydrate).   No orders of the defined types were placed in this encounter.   Return precautions given.   Risks, benefits, and alternatives of the medications and treatment plan prescribed today were discussed, and patient expressed understanding.   Education regarding symptom management and diagnosis given to patient on AVS.  Continue to follow with McLean-Scocuzza, Nino Glow, MD for routine health maintenance.   Cynthia Bryan and I agreed with plan.   Mable Paris, FNP

## 2021-11-07 NOTE — Patient Instructions (Signed)
Let me know how you are doing  Referral to do urology, Dr. Hollice Espy  Let us know if you dont hear back within a week in regards to an appointment being scheduled.

## 2021-11-08 LAB — URINE CULTURE
MICRO NUMBER:: 13872604
SPECIMEN QUALITY:: ADEQUATE

## 2021-11-13 ENCOUNTER — Other Ambulatory Visit: Payer: Self-pay | Admitting: Family

## 2021-11-13 DIAGNOSIS — R8290 Unspecified abnormal findings in urine: Secondary | ICD-10-CM

## 2021-11-15 ENCOUNTER — Ambulatory Visit: Payer: Medicare HMO | Admitting: Urology

## 2021-11-15 ENCOUNTER — Encounter: Payer: Self-pay | Admitting: Urology

## 2021-11-15 VITALS — BP 167/79 | HR 91 | Ht 66.0 in | Wt 195.0 lb

## 2021-11-15 DIAGNOSIS — Z87440 Personal history of urinary (tract) infections: Secondary | ICD-10-CM | POA: Diagnosis not present

## 2021-11-15 DIAGNOSIS — R8289 Other abnormal findings on cytological and histological examination of urine: Secondary | ICD-10-CM

## 2021-11-15 DIAGNOSIS — N390 Urinary tract infection, site not specified: Secondary | ICD-10-CM

## 2021-11-15 LAB — URINALYSIS, COMPLETE
Bilirubin, UA: NEGATIVE
Glucose, UA: NEGATIVE
Nitrite, UA: NEGATIVE
RBC, UA: NEGATIVE
Specific Gravity, UA: 1.02 (ref 1.005–1.030)
Urobilinogen, Ur: 0.2 mg/dL (ref 0.2–1.0)
pH, UA: 5.5 (ref 5.0–7.5)

## 2021-11-15 LAB — MICROSCOPIC EXAMINATION

## 2021-11-15 MED ORDER — ESTRADIOL 0.1 MG/GM VA CREA: TOPICAL_CREAM | VAGINAL | 12 refills | Status: AC

## 2021-11-15 NOTE — Progress Notes (Signed)
11/15/2021 3:27 PM   Cynthia Bryan [birth year] [id number]  Referring provider: Burnard Hawthorne, FNP [address] [place],  [place] [address]  Chief Complaint  Patient presents with   Recurrent UTI    HPI: 76 year old female referred back for further evaluation of recurrent UTIs.  Notably, she was seen by Zara Council for the same issue in 2019.  Pelvic exam revealed stage II cystocele and atrophic vaginitis.  She was prescribed topical estrogen cream.  She was subsequently taken to the operating room by Dr. Yves Dill 2/21 for left retrograde which was unremarkable other than for "possible drooping lily sign".  She continue to follow with this urologist up until recently when she felt that was not attentive to her needs when the wrong antibiotic was prescribed and not changed once the urine culture indicated that it was not sensitive.  Most recent cross-sectional imaging in the form of CT abdomen with contrast on 05/10/2021 showed a punctate left nonobstructing stones, bilateral renal cyst including proteinaceous and hemorrhagic on the right otherwise unremarkable.  Since her initial evaluation with [place] in 2019, she was diagnosed with adenocarcinoma of the distal rectum, PT to N0 M0 stage Ia.  She underwent transanal excision of the distal rectal cancer.  She received concurrent chemo rads with Xeloda.  Over the past calendar year, she has had several contaminated appearing urines with associated negative cultures.  She did have a true urinary tract infection on 10/18/2021 which grew E. coli.  She has had numerous UAs and urine culture sent which appear to be improving with cultures negative.   PMH: Past Medical History:  Diagnosis Date   Arthritis    back knees, shoulders   Basal cell carcinoma    right temple Dr. Elmer Ramp removed 01/2017    Chicken pox    as child   Chronic back pain    COVID-19 10/13/2019   scratchy throat chills fever x 6 weeks x 6 weeks all  symptoms resolved   Family history of bladder cancer    Family history of breast cancer    Family history of lung cancer    Family history of stomach cancer    Fatty liver    09/19/15   Gastric ulcer    neg H pylori  diet controlled   GERD (gastroesophageal reflux disease)    Gout    Hiatal hernia    SMALL   History of kidney stones    Hyperlipidemia    Hypertension    Lumbar herniated disc    Rectal cancer ([place])    Recurrent UTI    last uti 6 months ago per pt on 05-23-2021   Rosacea    Syncope none since 2018 per pt    per pt presyncope no LOC felt dizzy/vision distorted and couldnt speak EEG neg, CT head neg 07/31/16, f/u [facility] cardiology with US carotid, echo but no Holter    Umbilical hernia    noted 09/19/15    UTI (urinary tract infection)    Vitamin B 12 deficiency    Vitamin D deficiency    Wears glasses     Surgical History: Past Surgical History:  Procedure Laterality Date   BREAST BIOPSY Right 1980   cyst removed negative pathology    colonscopy  04/17/2021   at [place] Left 02/18/2020   Procedure: CYSTOSCOPY WITH RETROGRADE PYELOGRAM;  Surgeon: Royston Cowper, MD;  Location: [facility];  Service: Urology;  Laterality: Left;  EXTRACORPOREAL SHOCK WAVE LITHOTRIPSY Left 09/19/2017   Procedure: EXTRACORPOREAL SHOCK WAVE LITHOTRIPSY (ESWL);  Surgeon: Royston Cowper, MD;  Location: [facility];  Service: Urology;  Laterality: Left;   JOINT REPLACEMENT     right knee 11/2015 Dr. Janell Quiet [facility] [place] N/A 05/26/2021   Procedure: ANORECTAL EXAM UNDER ANESTHESIA;  Surgeon: Ileana Roup, MD;  Location: [place];  Service: General;  Laterality: N/A;   TRANSANAL EXCISION OF RECTAL MASS N/A 05/26/2021   Procedure: TRANSANAL EXCISION OF RIGHT ANTERIOR LATERAL DISTAL RECTAL CANCER;  Surgeon: Ileana Roup, MD;  Location: [facility];  Service: General;  Laterality: N/A;     Home Medications:  Allergies as of 11/15/2021       Reactions   Nortriptyline Nausea Only   Sulfa Antibiotics Rash        Medication List        Accurate as of November 15, 2021 11:59 PM. If you have any questions, ask your nurse or doctor.          STOP taking these medications    febuxostat 40 MG tablet Commonly known as: ULORIC Stopped by: Hollice Espy, MD   nitrofurantoin (macrocrystal-monohydrate) 100 MG capsule Commonly known as: Macrobid Stopped by: Hollice Espy, MD       TAKE these medications    acetaminophen 500 MG tablet Commonly known as: TYLENOL Take 1,000 mg by mouth 2 (two) times daily.   allopurinol 300 MG tablet Commonly known as: ZYLOPRIM Take 1 tablet (300 mg total) by mouth daily.   amLODipine 2.5 MG tablet Commonly known as: Norvasc Take 1-2 tablets (2.5-5 mg total) by mouth daily.   atorvastatin 40 MG tablet Commonly known as: LIPITOR Take 1 tablet (40 mg total) by mouth daily at 6 PM.   B-12 5000 MCG Caps Take 5,000 mcg by mouth daily.   estradiol 0.1 MG/GM vaginal cream Commonly known as: ESTRACE Estrogen Cream Instruction Discard applicator Apply pea sized amount to tip of finger to urethra before bed. Wash hands well after application. Use Monday, Wednesday and Friday Started by: Hollice Espy, MD   ezetimibe 10 MG tablet Commonly known as: ZETIA Take 1 tablet (10 mg total) by mouth at bedtime.   gabapentin 300 MG capsule Commonly known as: NEURONTIN Take 1 capsule (300 mg total) by mouth at bedtime.   ketoconazole 2 % cream Commonly known as: NIZORAL Apply 1 application topically 2 (two) times daily as needed for irritation. FACE   Magnesium 500 MG Caps Take 500 mg by mouth at bedtime.   methocarbamol 750 MG tablet Commonly known as: ROBAXIN Take 1 tablet (750 mg total) by mouth at bedtime.   omeprazole 20 MG capsule Commonly known as: PRILOSEC Take 1 capsule (20 mg total) by mouth daily. 30 min  before food   ondansetron 4 MG tablet Commonly known as: Zofran Take 1-2 tablets (4-8 mg total) by mouth every 8 (eight) hours as needed for nausea or vomiting.   potassium chloride 10 MEQ CR capsule Commonly known as: MICRO-K Take 3 capsules (30 mEq total) by mouth every morning.   traMADol 50 MG tablet Commonly known as: ULTRAM Takes 1 tab in am , 2 at hs   triamcinolone ointment 0.5 % Commonly known as: KENALOG Apply 1 application. topically 2 (two) times daily.   triamterene-hydrochlorothiazide 37.5-25 MG tablet Commonly known as: MAXZIDE-25 Take 1 tablet by mouth every morning.   Vitamin D3 125 MCG (  5000 UT) Caps Take 5,000 Units by mouth daily.        Allergies:  Allergies  Allergen Reactions   Nortriptyline Nausea Only   Sulfa Antibiotics Rash    Family History: Family History  Problem Relation Age of Onset   Stroke Mother    Breast cancer Mother 6   Cancer Mother        breast cancer s/p masectomy lived to be 46    Hypertension Mother    Cancer Father        NMSC skin cancer    Gout Father    Heart disease Father        s/p bypass surgery age 25 lived 25 months after surgery    Kidney disease Father        RAS>kidney failure    Bladder Cancer Brother    Hyperlipidemia Paternal Aunt    Stroke Paternal Aunt    Breast cancer Paternal Aunt    Rectal cancer Paternal Aunt    Kidney cancer Neg Hx    Prolactinoma Neg Hx     Social History:  reports that she quit smoking about 27 years ago. Her smoking use included cigarettes. She has a 25.00 pack-year smoking history. She has never used smokeless tobacco. She reports that she does not drink alcohol and does not use drugs.   Physical Exam: BP (!) 167/79   Pulse 91   Ht '5\' 6"'$  (1.676 m)   Wt 195 lb (88.5 kg)   BMI 31.47 kg/m   Constitutional:  Alert and oriented, No acute distress. HEENT: [place] AT, moist mucus membranes.  Trachea midline, no masses. Cardiovascular: No clubbing, cyanosis, or  edema. Respiratory: Normal respiratory effort, no increased work of breathing. Skin: No rashes, bruises or suspicious lesions. Neurologic: Grossly intact, no focal deficits, moving all 4 extremities. Psychiatric: Normal mood and affect.  Laboratory Data: Lab Results  Component Value Date   WBC 5.7 08/29/2021   HGB 12.1 08/29/2021   HCT 35.3 (L) 08/29/2021   MCV 102.6 (H) 08/29/2021   PLT 237 08/29/2021    Lab Results  Component Value Date   CREATININE 0.91 08/29/2021    Lab Results  Component Value Date   HGBA1C 6.2 08/01/2021    Urinalysis Results for orders placed or performed in visit on 11/15/21  Mycoplasma / Ureaplasma Culture   Specimen: Genital   UR  Result Value Ref Range   Ureaplasma urealyticum Comment Negative   Mycoplasma hominis Culture Comment Negative  Microscopic Examination   Urine  Result Value Ref Range   WBC, UA 11-30 (A) 0 - 5 /hpf   RBC, Urine 0-2 0 - 2 /hpf   Epithelial Cells (non renal) 0-10 0 - 10 /hpf   Bacteria, UA Moderate (A) None seen/Few  Urinalysis, Complete  Result Value Ref Range   Specific Gravity, UA 1.020 1.005 - 1.030   pH, UA 5.5 5.0 - 7.5   Color, UA Yellow Yellow   Appearance Ur Clear Clear   Leukocytes,UA 1+ (A) Negative   Protein,UA 1+ (A) Negative/Trace   Glucose, UA Negative Negative   Ketones, UA Trace (A) Negative   RBC, UA Negative Negative   Bilirubin, UA Negative Negative   Urobilinogen, Ur 0.2 0.2 - 1.0 mg/dL   Nitrite, UA Negative Negative   Microscopic Examination See below:     Pertinent Imaging: Adrenals/Urinary Tract: Bilateral adrenal glands are within normal limits. No hydronephrosis. 14 mm right upper pole renal lesion on image 73/2 with Hounsfield  units greater than that expected for a simple cyst previously characterized as a hemorrhagic/proteinaceous cyst on CT March 16, 2020. 2.4 cm left interpolar renal cyst. Additional subcentimeter bilateral renal lesions are technically too small to  accurately characterize but statistically likely to reflect cysts. Small bilateral renal sinus cysts. Punctate nonobstructive left renal stone.  The above CT scan of the abdomen from 05/10/2021 was personally reviewed.  Agree with radiologic interpretation.  Assessment & Plan:    1. Recurrent UTI Urinalysis today with a few white blood cells, will repeat urine culture along with for atypicals  Discussed generalized UTI prevention techniques including the addition of daily probiotic, cranberry tablets, d-mannose as well as resumption of topical estrogen cream which she has not been using.  She was prescribed this again today and we reviewed the anatomy and how to apply the medication, pea-sized amount per urethral meatus 3 times per week.  Precautions reviewed.  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  Like to offer her a pelvic exam and cystoscopy to help rule in or out this possibility.  She is agreeable this plan will return for cystoscopy.   - Urinalysis, Complete - Mycoplasma / Ureaplasma Culture - CULTURE, URINE COMPREHENSIVE   Hollice Espy, MD  [place] [facility] [address] [place], [place] [address] [phone]

## 2021-11-15 NOTE — Patient Instructions (Signed)
Take D-mannose, probiotics, cranberry tablets daily   Cystoscopy Cystoscopy is a procedure that is used to help diagnose and sometimes treat conditions that affect the lower urinary tract. The lower urinary tract includes the bladder and the urethra. The urethra is the tube that drains urine from the bladder. Cystoscopy is done using a thin, tube-shaped instrument with a light and camera at the end (cystoscope). The cystoscope may be hard or flexible, depending on the goal of the procedure. The cystoscope is inserted through the urethra, into the bladder. Cystoscopy may be recommended if you have: Urinary tract infections that keep coming back. Blood in the urine (hematuria). An inability to control when you urinate (urinary incontinence) or an overactive bladder. Unusual cells found in a urine sample. A blockage in the urethra, such as a urinary stone. Painful urination. An abnormality in the bladder found during an intravenous pyelogram (IVP) or CT scan. What are the risks? Generally, this is a safe procedure. However, problems may occur, including: Infection. Bleeding.  What happens during the procedure?  You will be given one or more of the following: A medicine to numb the area (local anesthetic). The area around the opening of your urethra will be cleaned. The cystoscope will be passed through your urethra into your bladder. Germ-free (sterile) fluid will flow through the cystoscope to fill your bladder. The fluid will stretch your bladder so that your health care provider can clearly examine your bladder walls. Your doctor will look at the urethra and bladder. The cystoscope will be removed The procedure may vary among health care providers  What can I expect after the procedure? After the procedure, it is common to have: Some soreness or pain in your urethra. Urinary symptoms. These include: Mild pain or burning when you urinate. Pain should stop within a few minutes after you  urinate. This may last for up to a few days after the procedure. A small amount of blood in your urine for several days. Feeling like you need to urinate but producing only a small amount of urine. Follow these instructions at home: General instructions Return to your normal activities as told by your health care provider.  Drink plenty of fluids after the procedure. Keep all follow-up visits as told by your health care provider. This is important. Contact a health care provider if you: Have pain that gets worse or does not get better with medicine, especially pain when you urinate lasting longer than 72 hours after the procedure. Have trouble urinating. Get help right away if you: Have blood clots in your urine. Have a fever or chills. Are unable to urinate. Summary Cystoscopy is a procedure that is used to help diagnose and sometimes treat conditions that affect the lower urinary tract. Cystoscopy is done using a thin, tube-shaped instrument with a light and camera at the end. After the procedure, it is common to have some soreness or pain in your urethra. It is normal to have blood in your urine after the procedure.  If you were prescribed an antibiotic medicine, take it as told by your health care provider.  This information is not intended to replace advice given to you by your health care provider. Make sure you discuss any questions you have with your health care provider. Document Revised: 02/11/2018 Document Reviewed: 02/11/2018 Elsevier Patient Education  [place].

## 2021-11-18 LAB — CULTURE, URINE COMPREHENSIVE

## 2021-11-20 ENCOUNTER — Ambulatory Visit: Payer: Medicare HMO | Admitting: Family

## 2021-11-22 LAB — MYCOPLASMA / UREAPLASMA HOMINIS CULTURE
Mycoplasma hominis Culture: NEGATIVE
Ureaplasma urealyticum: NEGATIVE

## 2021-11-23 ENCOUNTER — Other Ambulatory Visit: Payer: Self-pay

## 2021-11-27 ENCOUNTER — Other Ambulatory Visit: Payer: Self-pay

## 2021-11-27 ENCOUNTER — Inpatient Hospital Stay: Payer: Medicare HMO | Attending: Hematology

## 2021-11-27 ENCOUNTER — Ambulatory Visit (HOSPITAL_COMMUNITY)
Admission: RE | Admit: 2021-11-27 | Discharge: 2021-11-27 | Disposition: A | Discharge status: Home / Self Care | Payer: Medicare HMO | Source: Ambulatory Visit | Attending: Hematology | Admitting: Hematology

## 2021-11-27 DIAGNOSIS — Z87440 Personal history of urinary (tract) infections: Secondary | ICD-10-CM | POA: Diagnosis not present

## 2021-11-27 DIAGNOSIS — C20 Malignant neoplasm of rectum: Secondary | ICD-10-CM | POA: Insufficient documentation

## 2021-11-27 DIAGNOSIS — N261 Atrophy of kidney (terminal): Secondary | ICD-10-CM | POA: Diagnosis not present

## 2021-11-27 DIAGNOSIS — Z79899 Other long term (current) drug therapy: Secondary | ICD-10-CM | POA: Diagnosis not present

## 2021-11-27 DIAGNOSIS — R9389 Abnormal findings on diagnostic imaging of other specified body structures: Secondary | ICD-10-CM | POA: Insufficient documentation

## 2021-11-27 DIAGNOSIS — N858 Other specified noninflammatory disorders of uterus: Secondary | ICD-10-CM | POA: Diagnosis not present

## 2021-11-27 LAB — CBC WITH DIFFERENTIAL (CANCER CENTER ONLY)
Abs Immature Granulocytes: 0.02 10*3/uL (ref 0.00–0.07)
Basophils Absolute: 0 10*3/uL (ref 0.0–0.1)
Basophils Relative: 0 %
Eosinophils Absolute: 0.2 10*3/uL (ref 0.0–0.5)
Eosinophils Relative: 2 %
HCT: 37.3 % (ref 36.0–46.0)
Hemoglobin: 12.6 g/dL (ref 12.0–15.0)
Immature Granulocytes: 0 %
Lymphocytes Relative: 14 %
Lymphs Abs: 1.1 10*3/uL (ref 0.7–4.0)
MCH: 32.7 pg (ref 26.0–34.0)
MCHC: 33.8 g/dL (ref 30.0–36.0)
MCV: 96.9 fL (ref 80.0–100.0)
Monocytes Absolute: 0.4 10*3/uL (ref 0.1–1.0)
Monocytes Relative: 6 %
Neutro Abs: 6.3 10*3/uL (ref 1.7–7.7)
Neutrophils Relative %: 78 %
Platelet Count: 232 10*3/uL (ref 150–400)
RBC: 3.85 MIL/uL — ABNORMAL LOW (ref 3.87–5.11)
RDW: 15.5 % (ref 11.5–15.5)
WBC Count: 8.1 10*3/uL (ref 4.0–10.5)
nRBC: 0 % (ref 0.0–0.2)

## 2021-11-27 LAB — CMP (CANCER CENTER ONLY)
ALT: 15 U/L (ref 0–44)
AST: 13 U/L — ABNORMAL LOW (ref 15–41)
Albumin: 4.1 g/dL (ref 3.5–5.0)
Alkaline Phosphatase: 80 U/L (ref 38–126)
Anion gap: 11 (ref 5–15)
BUN: 20 mg/dL (ref 8–23)
CO2: 27 mmol/L (ref 22–32)
Calcium: 9.4 mg/dL (ref 8.9–10.3)
Chloride: 101 mmol/L (ref 98–111)
Creatinine: 1 mg/dL (ref 0.44–1.00)
GFR, Estimated: 59 mL/min — ABNORMAL LOW
Glucose, Bld: 119 mg/dL — ABNORMAL HIGH (ref 70–99)
Potassium: 3.5 mmol/L (ref 3.5–5.1)
Sodium: 139 mmol/L (ref 135–145)
Total Bilirubin: 0.7 mg/dL (ref 0.3–1.2)
Total Protein: 7 g/dL (ref 6.5–8.1)

## 2021-11-27 LAB — CEA (IN HOUSE-CHCC): CEA (CHCC-In House): <1 ng/mL (ref 0.00–5.00)

## 2021-11-28 ENCOUNTER — Telehealth: Payer: Self-pay

## 2021-11-28 NOTE — Telephone Encounter (Signed)
[facility] called report on pt's MRI of Pelvis.  Notified Dr. Burr Medico of the pt's MRI report to review.

## 2021-11-29 ENCOUNTER — Encounter: Payer: Self-pay | Admitting: Hematology

## 2021-11-29 ENCOUNTER — Other Ambulatory Visit: Payer: Self-pay

## 2021-11-29 ENCOUNTER — Inpatient Hospital Stay (HOSPITAL_BASED_OUTPATIENT_CLINIC_OR_DEPARTMENT_OTHER): Payer: Medicare HMO | Admitting: Hematology

## 2021-11-29 ENCOUNTER — Telehealth: Payer: Self-pay

## 2021-11-29 VITALS — BP 147/63 | HR 77 | Temp 98.0°F | Resp 15 | Wt 204.2 lb

## 2021-11-29 DIAGNOSIS — R9389 Abnormal findings on diagnostic imaging of other specified body structures: Secondary | ICD-10-CM | POA: Diagnosis not present

## 2021-11-29 DIAGNOSIS — Z87440 Personal history of urinary (tract) infections: Secondary | ICD-10-CM | POA: Diagnosis not present

## 2021-11-29 DIAGNOSIS — Z79899 Other long term (current) drug therapy: Secondary | ICD-10-CM | POA: Diagnosis not present

## 2021-11-29 DIAGNOSIS — C20 Malignant neoplasm of rectum: Secondary | ICD-10-CM

## 2021-11-29 NOTE — Telephone Encounter (Signed)
This nurse received a call report for patients MR of Pelvis.  Report in Epic.  MD made aware.

## 2021-11-29 NOTE — Progress Notes (Signed)
[facility]   [phone] [phone]   Clinic Follow up Note   Patient Care Team: McLean-Scocuzza, Nino Glow, MD as PCP - General (Internal Medicine)  Date of Service:  11/29/2021  CHIEF COMPLAINT: f/u of rectal cancer  CURRENT THERAPY:  Surveillance  ASSESSMENT & PLAN:  Cynthia Bryan is a 76 y.o. female with   1. Adenocarcinoma of the distal rectum, G2, pT2, cN0M0 stage IA, MMR normal -positive routine Cologuard with PCP, she was asymptomatic. Colonoscopy on 04/17/21 showed 2 cm distal rectal mass that was palpable on DRE. Path confirmed invasive moderately differentiated adenocarcinoma. -local staging MRI on 04/28/21 showed cT2N0 disease without lymphovascular invasion -staging CT on 05/09/21 was negative for distant metastasis -upfront transanal excision on 05/26/21 with Dr. Dema Severin showed 2.5 cm tumor, negative margins, MMR normal -due to moderate risk of recurrence, she received adjuvant concurrent chemoRT with Xeloda on 07/03/21 - 08/15/21. She developed a rash to the back of her hands and chest, as well as diarrhea, otherwise tolerated well. -post-treatment anoscope by Dr. Dema Severin on 10/18/21 was benign. -post-treatment pelvic MRI on 11/27/21 showed NED. I reviewed the results with her today. -she is clinically doing well from a rectal cancer standpoint. Her only complaint is a burning sensation at the start of a BM. Labs from 9/25 reviewed, overall WNL.  -Colonoscopy due in April 2024   2. Endometrial Thickening -incidental finding on pelvis MRI 11/27/21, not mentioned on prior MRI in 04/2021. -while I do not have high suspicion for malignancy, I recommend further work up with a gynecologist. -she does not have a GYN. I encouraged her to ask her PCP for a referral as she lives in [place]. I'm happy to refer her to a doctor here in [place].     PLAN: -f/u with Dr. Dema Severin in 2-3 months -f/u in 6 months with lab and pelvic MRI several days  before -colonoscopy due 06/2022 -she will call me if she needs me to refer her to GYN    No problem-specific Assessment & Plan notes found for this encounter.   SUMMARY OF ONCOLOGIC HISTORY: Oncology History  Rectal cancer ([place])  04/25/2021 Initial Biopsy   Colonoscopy with Dr. Alice Reichert in [place], [place] -Per the notes in epic, she was found to have an infiltrative, sessile, ulcerated nonobstructing mass in the distal rectum measuring 2 cm in length and diameter, 0 to 3 cm from the anal verge. 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   04/25/2021 Initial Diagnosis   Rectal cancer ([place])   04/25/2021 Cancer Staging   Staging form: Colon and Rectum, AJCC 8th Edition - Clinical stage from 04/25/2021: Stage I (cT2, cN0, cM0) - Signed by Alla Feeling, NP on 06/19/2021 Stage prefix: Initial diagnosis Total positive nodes: 0   04/25/2021 Tumor Marker   Baseline CEA: 1.8 (normal)   04/28/2021 Imaging   Local staging pelvic MRI: IMPRESSION: TUMOR DESCRIPTION Circumferential Extent: 100 degrees from 9 o'clock to 5 o'clock along the posterior RIGHT border of the rectum.   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 Lesion at anorectal junction.     05/09/2021 Imaging   Staging CT chest/abdomen IMPRESSION: 1. No evidence of metastatic disease within the chest or abdomen. 2. Hepatic steatosis. 3. Punctate nonobstructive left renal stone. 4. Bilateral renal cysts, including a hemorrhagic/proteinaceous right upper pole renal  cyst. 5.  Aortic Atherosclerosis (ICD10-I70.0).   05/26/2021 Surgery   Transanal excision of distal rectal cancer by Dr. Nadeen Landau   05/26/2021 Pathology Results   A. RECTUM, RIGHT ANTERIOR LATERAL DISTAL, RESECTION:  - Invasive moderately differentiated  adenocarcinoma, 2.5 cm  - Carcinoma focally invades into superficial muscularis propria  - Resection margins are negative for carcinoma  - Lateral mucosal margins are negative for dysplasia  - No evidence of lymphovascular invasion  - See oncology table  pT2, PN X IHC EXPRESSION RESULTS  TEST           RESULT  MLH1:          Preserved nuclear expression  MSH2:          Preserved nuclear expression  MSH6:          Preserved nuclear expression  PMS2:          Preserved nuclear expression     05/26/2021 Cancer Staging   Staging form: Colon and Rectum, AJCC 8th Edition - Pathologic stage from 05/26/2021: Stage Unknown (pT2, pNX, cM0) - Signed by Alla Feeling, NP on 06/19/2021    Genetic Testing   Negative genetic testing. No pathogenic variants identified on the [facility] CustomNext+RNA panel. The report date is 07/14/2021.  The CustomNext-Cancer+RNAinsight panel offered by [facility] includes sequencing and rearrangement analysis for the following 47 genes:  APC, ATM, AXIN2, BARD1, BMPR1A, BRCA1, BRCA2, BRIP1, CDH1, CDK4, CDKN2A, CHEK2, DICER1, EPCAM, GREM1, HOXB13, MEN1, MLH1, MSH2, MSH3, MSH6, MUTYH, NBN, NF1, NF2, NTHL1, PALB2, PMS2, POLD1, POLE, PTEN, RAD51C, RAD51D, RECQL, RET, SDHA, SDHAF2, SDHB, SDHC, SDHD, SMAD4, SMARCA4, STK11, TP53, TSC1, TSC2, and VHL.  RNA data is routinely analyzed for use in variant interpretation for all genes.      INTERVAL HISTORY:  Cynthia Bryan is here for a follow up of rectal cancer. She was last seen by me on 08/29/21. She presents to the clinic alone. She tells me she was recently dealing with a UTI for about a month. She reports BM managed with miralax, but she adds she experiences burning at the start of a BM.   All other systems were reviewed with the patient and are negative.  MEDICAL HISTORY:  Past Medical History:  Diagnosis Date   Arthritis    back knees, shoulders   Basal cell carcinoma    right temple Dr. Elmer Ramp removed 01/2017     Chicken pox    as child   Chronic back pain    COVID-19 10/13/2019   scratchy throat chills fever x 6 weeks x 6 weeks all symptoms resolved   Family history of bladder cancer    Family history of breast cancer    Family history of lung cancer    Family history of stomach cancer    Fatty liver    09/19/15   Gastric ulcer    neg H pylori  diet controlled   GERD (gastroesophageal reflux disease)    Gout    Hiatal hernia    SMALL   History of kidney stones    Hyperlipidemia    Hypertension    Lumbar herniated disc    Rectal cancer ([place])    Recurrent UTI    last uti 6 months ago per pt on 05-23-2021   Rosacea    Syncope none since 2018 per pt    per pt presyncope no LOC felt dizzy/vision distorted and couldnt speak EEG neg, CT head neg 07/31/16, f/u [facility] cardiology with  US carotid, echo but no Holter    Umbilical hernia    noted 09/19/15    UTI (urinary tract infection)    Vitamin B 12 deficiency    Vitamin D deficiency    Wears glasses     SURGICAL HISTORY: Past Surgical History:  Procedure Laterality Date   BREAST BIOPSY Right 1980   cyst removed negative pathology    colonscopy  04/17/2021   at [place] Left 02/18/2020   Procedure: CYSTOSCOPY WITH RETROGRADE PYELOGRAM;  Surgeon: Royston Cowper, MD;  Location: [facility];  Service: Urology;  Laterality: Left;   EXTRACORPOREAL SHOCK WAVE LITHOTRIPSY Left 09/19/2017   Procedure: EXTRACORPOREAL SHOCK WAVE LITHOTRIPSY (ESWL);  Surgeon: Royston Cowper, MD;  Location: [facility];  Service: Urology;  Laterality: Left;   JOINT REPLACEMENT     right knee 11/2015 Dr. Janell Quiet [facility] N/A 05/26/2021   Procedure: ANORECTAL EXAM UNDER ANESTHESIA;  Surgeon: Ileana Roup, MD;  Location: [place];  Service: General;  Laterality: N/A;   TRANSANAL EXCISION OF RECTAL MASS N/A 05/26/2021   Procedure: TRANSANAL EXCISION OF RIGHT ANTERIOR LATERAL DISTAL  RECTAL CANCER;  Surgeon: Ileana Roup, MD;  Location: [place] [facility];  Service: General;  Laterality: N/A;    I have reviewed the social history and family history with the patient and they are unchanged from previous note.  ALLERGIES:  is allergic to nortriptyline and sulfa antibiotics.  MEDICATIONS:  Current Outpatient Medications  Medication Sig Dispense Refill   acetaminophen (TYLENOL) 500 MG tablet Take 1,000 mg by mouth 2 (two) times daily.      allopurinol (ZYLOPRIM) 300 MG tablet Take 1 tablet (300 mg total) by mouth daily. 90 tablet 3   amLODipine (NORVASC) 2.5 MG tablet Take 1-2 tablets (2.5-5 mg total) by mouth daily. 180 tablet 3   atorvastatin (LIPITOR) 40 MG tablet Take 1 tablet (40 mg total) by mouth daily at 6 PM. 90 tablet 3   Cholecalciferol (VITAMIN D3) 5000 units CAPS Take 5,000 Units by mouth daily.      Cyanocobalamin (B-12) 5000 MCG CAPS Take 5,000 mcg by mouth daily.      estradiol (ESTRACE) 0.1 MG/GM vaginal cream Estrogen Cream Instruction Discard applicator Apply pea sized amount to tip of finger to urethra before bed. Wash hands well after application. Use Monday, Wednesday and Friday 42.5 g 12   ezetimibe (ZETIA) 10 MG tablet Take 1 tablet (10 mg total) by mouth at bedtime. 90 tablet 3   gabapentin (NEURONTIN) 300 MG capsule Take 1 capsule (300 mg total) by mouth at bedtime. 90 capsule 3   ketoconazole (NIZORAL) 2 % cream Apply 1 application topically 2 (two) times daily as needed for irritation. FACE 30 g 11   Magnesium 500 MG CAPS Take 500 mg by mouth at bedtime.      methocarbamol (ROBAXIN) 750 MG tablet Take 1 tablet (750 mg total) by mouth at bedtime. 90 tablet 3   omeprazole (PRILOSEC) 20 MG capsule Take 1 capsule (20 mg total) by mouth daily. 30 min before food 90 capsule 3   ondansetron (ZOFRAN) 4 MG tablet Take 1-2 tablets (4-8 mg total) by mouth every 8 (eight) hours as needed for nausea or vomiting. 30 tablet 2   potassium chloride  (MICRO-K) 10 MEQ CR capsule Take 3 capsules (30 mEq total) by mouth every morning. 360 capsule 3   traMADol (ULTRAM) 50 MG  tablet Takes 1 tab in am , 2 at hs 170 tablet 5   triamcinolone ointment (KENALOG) 0.5 % Apply 1 application. topically 2 (two) times daily. 30 g 1   triamterene-hydrochlorothiazide (MAXZIDE-25) 37.5-25 MG tablet Take 1 tablet by mouth every morning. 90 tablet 3   No current facility-administered medications for this visit.    PHYSICAL EXAMINATION: ECOG PERFORMANCE STATUS: 0 - Asymptomatic  Vitals:   11/29/21 0902  BP: (!) 147/63  Pulse: 77  Resp: 15  Temp: 98 F (36.7 C)  SpO2: 100%   Wt Readings from Last 3 Encounters:  11/29/21 204 lb 3.2 oz (92.6 kg)  11/15/21 195 lb (88.5 kg)  11/07/21 195 lb (88.5 kg)     GENERAL:alert, no distress and comfortable SKIN: skin color, texture, turgor are normal, no rashes or significant lesions EYES: normal, Conjunctiva are pink and non-injected, sclera clear  NECK: supple, thyroid normal size, non-tender, without nodularity LYMPH:  no palpable lymphadenopathy in the cervical, axillary  LUNGS: clear to auscultation and percussion with normal breathing effort HEART: regular rate & rhythm and no murmurs and no lower extremity edema ABDOMEN:abdomen soft, non-tender and normal bowel sounds Musculoskeletal:no cyanosis of digits and no clubbing  NEURO: alert & oriented x 3 with fluent speech, no focal motor/sensory deficits  LABORATORY DATA:  I have reviewed the data as listed    Latest Ref Rng & Units 11/27/2021    2:38 PM 08/29/2021    9:20 AM 08/07/2021   10:08 AM  CBC  WBC 4.0 - 10.5 K/uL 8.1  5.7  6.4   Hemoglobin 12.0 - 15.0 g/dL 12.6  12.1  12.3   Hematocrit 36.0 - 46.0 % 37.3  35.3  36.0   Platelets 150 - 400 K/uL 232  237  245         Latest Ref Rng & Units 11/27/2021    2:38 PM 08/29/2021    9:20 AM 08/07/2021   10:08 AM  CMP  Glucose 70 - 99 mg/dL 119  125  135   BUN 8 - 23 mg/dL 20  23  17    Creatinine  0.44 - 1.00 mg/dL 1.00  0.91  0.95   Sodium 135 - 145 mmol/L 139  138  139   Potassium 3.5 - 5.1 mmol/L 3.5  3.6  3.3   Chloride 98 - 111 mmol/L 101  103  98   CO2 22 - 32 mmol/L 27  27  32   Calcium 8.9 - 10.3 mg/dL 9.4  9.9  10.0   Total Protein 6.5 - 8.1 g/dL 7.0  6.7  6.8   Total Bilirubin 0.3 - 1.2 mg/dL 0.7  0.7  0.8   Alkaline Phos 38 - 126 U/L 80  83  81   AST 15 - 41 U/L 13  14  16    ALT 0 - 44 U/L 15  17  17        RADIOGRAPHIC STUDIES: I have personally reviewed the radiological images as listed and agreed with the findings in the report. No results found.    Orders Placed This Encounter  Procedures   MR PELVIS WO CM RECTAL CA STAGING    Standing Status:   Future    Standing Expiration Date:   11/30/2022    Order Specific Question:   If indicated for the ordered procedure, I authorize the administration of contrast media per Radiology protocol    Answer:   Yes    Order Specific Question:   What  is the patient's sedation requirement?    Answer:   No Sedation    Order Specific Question:   Does the patient have a pacemaker or implanted devices?    Answer:   No    Order Specific Question:   Preferred imaging location?    Answer:   [facility] (table limit - 550 lbs)   All questions were answered. The patient knows to call the clinic with any problems, questions or concerns. No barriers to learning was detected. The total time spent in the appointment was 30 minutes.     Truitt Merle, MD 11/29/2021   I, Wilburn Mylar, am acting as scribe for Truitt Merle, MD.   I have reviewed the above documentation for accuracy and completeness, and I agree with the above.

## 2021-11-30 ENCOUNTER — Telehealth: Payer: Self-pay | Admitting: Internal Medicine

## 2021-11-30 NOTE — Telephone Encounter (Signed)
Pt called stating she had an MRI done and it showed she had a problem with her uterus. Pt would like a referral to a gynecologist and she would like to be called

## 2021-12-05 ENCOUNTER — Encounter: Payer: Self-pay | Admitting: Urology

## 2021-12-05 ENCOUNTER — Encounter: Payer: Self-pay | Admitting: Internal Medicine

## 2021-12-05 ENCOUNTER — Ambulatory Visit: Payer: Medicare HMO | Admitting: Urology

## 2021-12-05 VITALS — BP 168/83 | HR 98 | Ht 66.0 in | Wt 203.6 lb

## 2021-12-05 DIAGNOSIS — N390 Urinary tract infection, site not specified: Secondary | ICD-10-CM | POA: Diagnosis not present

## 2021-12-05 DIAGNOSIS — R9389 Abnormal findings on diagnostic imaging of other specified body structures: Secondary | ICD-10-CM | POA: Insufficient documentation

## 2021-12-05 DIAGNOSIS — Z87440 Personal history of urinary (tract) infections: Secondary | ICD-10-CM | POA: Diagnosis not present

## 2021-12-05 LAB — URINALYSIS, COMPLETE
Bilirubin, UA: NEGATIVE
Glucose, UA: NEGATIVE
Ketones, UA: NEGATIVE
Nitrite, UA: NEGATIVE
Protein,UA: NEGATIVE
RBC, UA: NEGATIVE
Specific Gravity, UA: 1.02 (ref 1.005–1.030)
Urobilinogen, Ur: 0.2 mg/dL (ref 0.2–1.0)
pH, UA: 5.5 (ref 5.0–7.5)

## 2021-12-05 LAB — MICROSCOPIC EXAMINATION

## 2021-12-05 NOTE — Telephone Encounter (Signed)
Called pt she stated she reached out to her oncologist who put in a referral to one for her she has been scheduled.

## 2021-12-05 NOTE — Progress Notes (Signed)
   12/05/21  CC:  Chief Complaint  Patient presents with   Cysto    HPI: 76 year old female with recurrent UTI/urinary irritative symptoms who presents today for cystoscopy.  Notably, she is diagnosed with rectal cancer status post radiation.  There were no vitals taken for this visit. NED. A&Ox3.   No respiratory distress   Abd soft, NT, ND Normal external genitalia with patent urethral meatus Speculum exam today shows periurethral atrophy and atrophic vaginitis, no significant prolapse  Cystoscopy Procedure Note  Patient identification was confirmed, informed consent was obtained, and patient was prepped using Betadine solution.  Lidocaine jelly was administered per urethral meatus.    Procedure: - Flexible cystoscope introduced, without any difficulty.   - Thorough search of the bladder revealed:    normal urethral meatus    normal urothelium    no stones    no ulcers     no tumors    no urethral polyps    no trabeculation  - Ureteral orifices were normal in position and appearance.  Post-Procedure: - Patient tolerated the procedure well  Assessment/ Plan:  1. Recurrent UTI Cystoscopy was normal today which was reassuring  Periurethral atrophy appreciated, will likely benefit from continued use of topical estrogen cream.  She does note that she is already had some improvement.  At this point, she may follow-up as needed - Urinalysis, Complete   Hollice Espy, MD

## 2021-12-05 NOTE — Telephone Encounter (Signed)
[facility]erred to [facility] [place] doctor visit they should call her  Elvina Mattes if you can help    No Physician   Engineer, maintenance Information  Phone Fax E-mail Address  [phone] Not available [address]   [place]   [place] [address]     Specialties     Obstetrics and Gynecology

## 2021-12-05 NOTE — Addendum Note (Signed)
Addended by: Orland Mustard on: 12/05/2021 05:40 PM   Modules accepted: Orders

## 2021-12-06 NOTE — Telephone Encounter (Signed)
I called pt to follow up pt states she has an appt on 12/13/21 with [facility] with Kathryne Eriksson. I'll close referral.

## 2021-12-13 DIAGNOSIS — R69 Illness, unspecified: Secondary | ICD-10-CM | POA: Diagnosis not present

## 2021-12-13 DIAGNOSIS — Z124 Encounter for screening for malignant neoplasm of cervix: Secondary | ICD-10-CM | POA: Diagnosis not present

## 2021-12-13 DIAGNOSIS — Z6834 Body mass index (BMI) 34.0-34.9, adult: Secondary | ICD-10-CM | POA: Diagnosis not present

## 2021-12-13 DIAGNOSIS — Z01419 Encounter for gynecological examination (general) (routine) without abnormal findings: Secondary | ICD-10-CM | POA: Diagnosis not present

## 2021-12-13 DIAGNOSIS — R9389 Abnormal findings on diagnostic imaging of other specified body structures: Secondary | ICD-10-CM | POA: Diagnosis not present

## 2021-12-13 DIAGNOSIS — Z1151 Encounter for screening for human papillomavirus (HPV): Secondary | ICD-10-CM | POA: Diagnosis not present

## 2021-12-13 DIAGNOSIS — C218 Malignant neoplasm of overlapping sites of rectum, anus and anal canal: Secondary | ICD-10-CM | POA: Diagnosis not present

## 2021-12-22 DIAGNOSIS — R9389 Abnormal findings on diagnostic imaging of other specified body structures: Secondary | ICD-10-CM | POA: Diagnosis not present

## 2021-12-22 DIAGNOSIS — N840 Polyp of corpus uteri: Secondary | ICD-10-CM | POA: Diagnosis not present

## 2022-01-03 DIAGNOSIS — N859 Noninflammatory disorder of uterus, unspecified: Secondary | ICD-10-CM | POA: Diagnosis not present

## 2022-01-03 DIAGNOSIS — N888 Other specified noninflammatory disorders of cervix uteri: Secondary | ICD-10-CM | POA: Diagnosis not present

## 2022-01-03 DIAGNOSIS — N8500 Endometrial hyperplasia, unspecified: Secondary | ICD-10-CM | POA: Diagnosis not present

## 2022-01-03 DIAGNOSIS — Z85048 Personal history of other malignant neoplasm of rectum, rectosigmoid junction, and anus: Secondary | ICD-10-CM | POA: Diagnosis not present

## 2022-01-12 DIAGNOSIS — Z1231 Encounter for screening mammogram for malignant neoplasm of breast: Secondary | ICD-10-CM | POA: Diagnosis not present

## 2022-02-05 DIAGNOSIS — D6851 Activated protein C resistance: Secondary | ICD-10-CM | POA: Diagnosis not present

## 2022-02-05 DIAGNOSIS — N882 Stricture and stenosis of cervix uteri: Secondary | ICD-10-CM | POA: Diagnosis not present

## 2022-02-05 DIAGNOSIS — N9489 Other specified conditions associated with female genital organs and menstrual cycle: Secondary | ICD-10-CM | POA: Diagnosis not present

## 2022-02-15 ENCOUNTER — Telehealth: Payer: Self-pay | Admitting: Internal Medicine

## 2022-02-15 NOTE — Telephone Encounter (Signed)
error 

## 2022-02-21 DIAGNOSIS — K219 Gastro-esophageal reflux disease without esophagitis: Secondary | ICD-10-CM | POA: Diagnosis not present

## 2022-02-21 DIAGNOSIS — N882 Stricture and stenosis of cervix uteri: Secondary | ICD-10-CM | POA: Diagnosis not present

## 2022-02-21 DIAGNOSIS — Z882 Allergy status to sulfonamides status: Secondary | ICD-10-CM | POA: Diagnosis not present

## 2022-02-21 DIAGNOSIS — N8500 Endometrial hyperplasia, unspecified: Secondary | ICD-10-CM | POA: Diagnosis not present

## 2022-02-21 DIAGNOSIS — E785 Hyperlipidemia, unspecified: Secondary | ICD-10-CM | POA: Diagnosis not present

## 2022-02-21 DIAGNOSIS — I10 Essential (primary) hypertension: Secondary | ICD-10-CM | POA: Diagnosis not present

## 2022-02-21 DIAGNOSIS — R9389 Abnormal findings on diagnostic imaging of other specified body structures: Secondary | ICD-10-CM | POA: Diagnosis not present

## 2022-02-21 DIAGNOSIS — Z79899 Other long term (current) drug therapy: Secondary | ICD-10-CM | POA: Diagnosis not present

## 2022-02-21 DIAGNOSIS — D6851 Activated protein C resistance: Secondary | ICD-10-CM | POA: Diagnosis not present

## 2022-02-21 DIAGNOSIS — Z87440 Personal history of urinary (tract) infections: Secondary | ICD-10-CM | POA: Diagnosis not present

## 2022-02-21 DIAGNOSIS — N840 Polyp of corpus uteri: Secondary | ICD-10-CM | POA: Diagnosis not present

## 2022-02-21 DIAGNOSIS — Z87891 Personal history of nicotine dependence: Secondary | ICD-10-CM | POA: Diagnosis not present

## 2022-02-21 DIAGNOSIS — Z85048 Personal history of other malignant neoplasm of rectum, rectosigmoid junction, and anus: Secondary | ICD-10-CM | POA: Diagnosis not present

## 2022-02-21 DIAGNOSIS — C20 Malignant neoplasm of rectum: Secondary | ICD-10-CM | POA: Diagnosis not present

## 2022-02-21 DIAGNOSIS — Z923 Personal history of irradiation: Secondary | ICD-10-CM | POA: Diagnosis not present

## 2022-02-21 DIAGNOSIS — N859 Noninflammatory disorder of uterus, unspecified: Secondary | ICD-10-CM | POA: Diagnosis not present

## 2022-02-21 DIAGNOSIS — D6852 Prothrombin gene mutation: Secondary | ICD-10-CM | POA: Diagnosis not present

## 2022-02-21 DIAGNOSIS — Z888 Allergy status to other drugs, medicaments and biological substances status: Secondary | ICD-10-CM | POA: Diagnosis not present

## 2022-03-28 ENCOUNTER — Telehealth: Payer: Self-pay

## 2022-03-28 NOTE — Telephone Encounter (Signed)
We received a prescription refill request via fax.  I called patient to see if she is planning to schedule a TOC with [place] since Dr. Olivia Mackie McLean-Scocuzza is no longer here.  Patient states she has transferred her care to Dr. Doy Hutching and she has reached out to her pharmacy to let them know.

## 2022-04-19 ENCOUNTER — Encounter (HOSPITAL_COMMUNITY): Payer: Self-pay | Admitting: Surgery

## 2022-04-20 NOTE — Progress Notes (Signed)
Attempted to obtain medical history via telephone, unable to reach at this time. HIPAA compliant voicemail message left requesting return call to pre surgical testing department. 

## 2022-04-27 ENCOUNTER — Encounter (HOSPITAL_COMMUNITY): Payer: Self-pay | Admitting: Surgery

## 2022-04-27 ENCOUNTER — Ambulatory Visit (HOSPITAL_COMMUNITY)
Admission: RE | Admit: 2022-04-27 | Discharge: 2022-04-27 | Disposition: A | Discharge status: Home / Self Care | Payer: Medicare HMO | Attending: Surgery | Admitting: Surgery

## 2022-04-27 ENCOUNTER — Encounter (HOSPITAL_COMMUNITY)
Admission: RE | Disposition: A | Discharge status: Home / Self Care | Payer: Self-pay | Source: Home / Self Care | Attending: Surgery

## 2022-04-27 ENCOUNTER — Other Ambulatory Visit: Payer: Self-pay

## 2022-04-27 ENCOUNTER — Ambulatory Visit (HOSPITAL_COMMUNITY): Payer: Medicare HMO | Admitting: Anesthesiology

## 2022-04-27 ENCOUNTER — Ambulatory Visit (HOSPITAL_BASED_OUTPATIENT_CLINIC_OR_DEPARTMENT_OTHER): Payer: Medicare HMO | Admitting: Anesthesiology

## 2022-04-27 DIAGNOSIS — Z85048 Personal history of other malignant neoplasm of rectum, rectosigmoid junction, and anus: Secondary | ICD-10-CM | POA: Insufficient documentation

## 2022-04-27 DIAGNOSIS — M5416 Radiculopathy, lumbar region: Secondary | ICD-10-CM | POA: Diagnosis not present

## 2022-04-27 DIAGNOSIS — Z79899 Other long term (current) drug therapy: Secondary | ICD-10-CM | POA: Insufficient documentation

## 2022-04-27 DIAGNOSIS — Z85038 Personal history of other malignant neoplasm of large intestine: Secondary | ICD-10-CM

## 2022-04-27 DIAGNOSIS — K760 Fatty (change of) liver, not elsewhere classified: Secondary | ICD-10-CM | POA: Insufficient documentation

## 2022-04-27 DIAGNOSIS — Z87891 Personal history of nicotine dependence: Secondary | ICD-10-CM

## 2022-04-27 DIAGNOSIS — Z1211 Encounter for screening for malignant neoplasm of colon: Secondary | ICD-10-CM | POA: Diagnosis not present

## 2022-04-27 DIAGNOSIS — M1990 Unspecified osteoarthritis, unspecified site: Secondary | ICD-10-CM | POA: Insufficient documentation

## 2022-04-27 DIAGNOSIS — G709 Myoneural disorder, unspecified: Secondary | ICD-10-CM

## 2022-04-27 DIAGNOSIS — K219 Gastro-esophageal reflux disease without esophagitis: Secondary | ICD-10-CM | POA: Diagnosis not present

## 2022-04-27 DIAGNOSIS — Z8711 Personal history of peptic ulcer disease: Secondary | ICD-10-CM | POA: Insufficient documentation

## 2022-04-27 DIAGNOSIS — D6851 Activated protein C resistance: Secondary | ICD-10-CM | POA: Diagnosis not present

## 2022-04-27 DIAGNOSIS — I10 Essential (primary) hypertension: Secondary | ICD-10-CM

## 2022-04-27 DIAGNOSIS — R7303 Prediabetes: Secondary | ICD-10-CM | POA: Diagnosis not present

## 2022-04-27 HISTORY — PX: BIOPSY: SHX5522

## 2022-04-27 HISTORY — PX: FLEXIBLE SIGMOIDOSCOPY: SHX5431

## 2022-04-27 SURGERY — FLEXIBLE SIGMOIDOSCOPY
Anesthesia: Monitor Anesthesia Care

## 2022-04-27 MED ORDER — SODIUM CHLORIDE 0.9 % IV SOLN: INTRAVENOUS | Status: DC

## 2022-04-27 MED ORDER — PROPOFOL 10 MG/ML IV BOLUS
INTRAVENOUS | Status: DC | PRN
  Administered 2022-04-27: 30 mg via INTRAVENOUS
  Administered 2022-04-27: 70 mg via INTRAVENOUS

## 2022-04-27 MED ORDER — LIDOCAINE 2% (20 MG/ML) 5 ML SYRINGE
INTRAMUSCULAR | Status: DC | PRN
  Administered 2022-04-27: 100 mg via INTRAVENOUS

## 2022-04-27 MED ORDER — LACTATED RINGERS IV SOLN
INTRAVENOUS | Status: DC
  Administered 2022-04-27: 1000 mL via INTRAVENOUS

## 2022-04-27 MED ORDER — PROPOFOL 500 MG/50ML IV EMUL
INTRAVENOUS | Status: DC | PRN
  Administered 2022-04-27: 200 ug/kg/min via INTRAVENOUS

## 2022-04-27 NOTE — Op Note (Signed)
[facility] Patient Name: Cynthia Bryan Procedure Date: 04/27/2022 MRN: [id number] Attending MD: Ileana Roup MD, MD,  Date of Birth: [birth year] CSN: [id number] Age: 77 Admit Type: Outpatient Procedure:                Flexible Sigmoidoscopy Indications:              High risk colon cancer surveillance: Personal                            history of colon cancer Providers:                Sharon Mt. Shana Zavaleta MD, MD, Mikey College, RN,                            Brien Mates, Technician Referring MD:              Medicines:                Monitored Anesthesia Care Complications:            No immediate complications. Estimated Blood Loss:     Estimated blood loss was minimal. Procedure:                Pre-Anesthesia Assessment:                           - Prior to the procedure, a History and Physical                            was performed, and patient medications, allergies                            and sensitivities were reviewed. The patient's                            tolerance of previous anesthesia was reviewed.                           - The risks and benefits of the procedure and the                            sedation options and risks were discussed with the                            patient. All questions were answered and informed                            consent was obtained.                           - Patient identification and proposed procedure                            were verified prior to the procedure by the                            physician, the nurse  and the anesthetist. The                            procedure was verified in the pre-procedure area in                            the procedure room.                           After obtaining informed consent, the scope was                            passed under direct vision. The [id number]                            Olympus colonoscope was introduced through the  anus                            and advanced to the the rectosigmoid junction. The                            flexible sigmoidoscopy was accomplished without                            difficulty. The patient tolerated the procedure                            well. The quality of the bowel preparation was                            adequate. Scope In: 12:12:03 PM Scope Out: 12:23:17 PM Total Procedure Duration: 0 hours 11 minutes 14 seconds  Findings:      The perianal and digital rectal examinations were normal. Pertinent       negatives include normal sphincter tone and no palpable rectal lesions.       Scar palpable in distal most rectum      A scar was found in the distal rectum. The scar tissue was healthy in       appearance. There was no evidence of the previous polyp. This was       biopsied with a cold forceps for histology. Estimated blood loss was       minimal.      The exam was otherwise without abnormality.      Of note, photos were taken of the stellate lesion x 3. Just like the       photos below, they were listed on the endoscopy computer as 'saved'       however did not transmit and were then deleted when the report was       closed and the patient was in recovery. The charge nurse has filed a       safety zone of the incident. Impression:               - Scar in the distal rectum. Biopsied.                           - The  examination was otherwise normal. Moderate Sedation:      Not Applicable - Patient had care per Anesthesia. Recommendation:           - Continue present medications.                           - Resume previous diet indefinitely. Procedure Code(s):        --- Professional ---                           [phone], 6, Sigmoidoscopy, flexible; with biopsy,                            single or multiple Diagnosis Code(s):        --- Professional ---                           [id number], Personal history of other malignant                            neoplasm  of large intestine CPT copyright 2022 American [employer]. All rights reserved. The codes documented in this report are preliminary and upon coder review may  be revised to meet current compliance requirements. Nadeen Landau, MD Ileana Roup MD, MD 04/27/2022 2:15:17 PM This report has been signed electronically. Number of Addenda: 0

## 2022-04-27 NOTE — H&P (Signed)
CC: Here today for flexible sigmoidoscopy  HPI: Cynthia Bryan is an 77 y.o. female with history of HTN, HLD, GERD, Gout, whom is seen in the office today for follow-up  She reports she had a colonoscopy back in 2006 and was told she would need a repeat in approximately 10 years. COVID hit and she had not scheduled. She did have a Cologuard completed through her primary care that was positive and triggered a colonoscopy. She underwent colonoscopy with Dr. Alice Bryan in [place], [place] -Per the notes in epic, she was found to have an infiltrative, sessile, ulcerated nonobstructing mass in the distal rectum measuring 2 cm in length and diameter, 0 to 3 cm from the anal verge. 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  MRI pelvis rectal cancer protocoled 05/02/2021 demonstrated the rectal mass to be at the level of the internal anal sphincter. CmriT2N0; no lvi/emvi. Posterior right region of anorectal junction.  CT CAP 05/09/21 -no evidence of metastatic disease. Hepatic steatosis. Kidney stones. Aortic atherosclerosis.  Labs 04/25/21 Albumin 4.4 TBili 0.8 Liver enz nml  CEA: 1.8 (nml) Hgb 13.7  OR 05/26/21 - TAE distal rectal cancer - right anterolateral  PATH -  - Invasive moderately differentiated adenocarcinoma, 2.5 cm  - Carcinoma focally invades into superficial muscularis propria  - Resection margins are negative for carcinoma  - Lateral mucosal margins are negative for dysplasia  - No evidence of lymphovascular invasion   Case was presented at our multidisciplinary tumor board. All possible options were discussed. These were also reviewed with the patient. We discussed that if we were to proceed with definitive "surgery", this would almost certainly involve APR with permanent end colostomy. We discussed the somewhat off label approach with regards to chemo/radiation therapy in an effort to reduce  the probability of local or even systemic recurrence. She remains highly motivated to avoid any sort of surgery that may render her with substantial alterations in quality of life including colostomy. We did work to discuss what a colostomy would involve.  We discussed that local excision of a T2 rectal cancer leaves the perirectal lymph nodes unaddressed and in situ. We discussed with T2 rectal cancers, higher risk for nodal metastasis. MRI shows no evident involvment at present but these could be occult. For these reasons, standard is generally definitive surgical resection which in her case would involve an APR but as research has progressed and we have learned more about rectal cancer, there are select cases where chemo/chemoXRT may also be definitive and/or provide advantages from recurrence standpoint thereby avoiding APR.  She understands that her risk of recurrence may be higher than with surgery and accepts these risks.  Completed adjuvant cXRT with Drs. Cynthia Bryan and [facility]. She has been doing reasonably well. Has no complaints at present. No blood per rectum. No anal pain. Overall, very satisfied.  INTERVAL HX Pelvic MRI 11/28/21 -  1. No visible tumor or adenopathy. 2. No pelvic adenopathy. 3. Thickened endometrium versus in the endometrial canal. Irregularity of lower uterine segment raising the question of narrowing perhaps from prior endometrial ablation or stricture. Gyn consultation may be helpful with follow-up pelvic sonogram and or sampling as warranted.  She is now being followed with Gyn/Onc at [facility] for her endometrial findings/polyp - Dr. Fransisca Bryan. She has had issues related to stenosis of her internal cervical os limiting evaluation. She has follow-up with them in the near future to discuss next steps.  INTERVAL HX Successful hysterectomy  at [facility] with Dr. Denman Bryan. She denies any complaints at present. She denies any changes in her bowel habits with regards to any constipation or  diarrhea. She denies any blood in her stool. She denies any anorectal pain or prolapsing tissue.  PMH: HTN, HLD, GERD, gout  PSH: Right knee surgery; denies any prior abdominal or pelvic surgeries  FHx: Paternal aunt had colon cancer; her mother had breast cancer in her 67s. Denies any other known family history of colorectal, breast, endometrial or ovarian cancer  Social Hx: Denies use of tobacco/EtOH/illicit drug. She is happily retired, previously worked as a Customer service manager   Past Medical History:  Diagnosis Date   Arthritis    back knees, shoulders   Basal cell carcinoma    right temple Dr. Elmer Bryan removed 01/2017    Chicken pox    as child   Chronic back pain    COVID-19 10/13/2019   scratchy throat chills fever x 6 weeks x 6 weeks all symptoms resolved   Family history of bladder cancer    Family history of breast cancer    Family history of lung cancer    Family history of stomach cancer    Fatty liver    09/19/15   Gastric ulcer    neg H pylori  diet controlled   GERD (gastroesophageal reflux disease)    Gout    Hiatal hernia    SMALL   History of kidney stones    Hyperlipidemia    Hypertension    Lumbar herniated disc    Rectal cancer ([place])    Recurrent UTI    last uti 6 months ago per pt on 05-23-2021   Rosacea    Syncope none since 2018 per pt    per pt presyncope no LOC felt dizzy/vision distorted and couldnt speak EEG neg, CT head neg 07/31/16, f/u [facility] cardiology with US carotid, echo but no Holter    Umbilical hernia    noted 09/19/15    UTI (urinary tract infection)    Vitamin B 12 deficiency    Vitamin D deficiency    Wears glasses     Past Surgical History:  Procedure Laterality Date   BREAST BIOPSY Right 1980   cyst removed negative pathology    colonscopy  04/17/2021   at [facility] Left 02/18/2020   Procedure: CYSTOSCOPY WITH RETROGRADE PYELOGRAM;  Surgeon: Cynthia Cowper, MD;  Location: [facility];  Service: Urology;   Laterality: Left;   EXTRACORPOREAL SHOCK WAVE LITHOTRIPSY Left 09/19/2017   Procedure: EXTRACORPOREAL SHOCK WAVE LITHOTRIPSY (ESWL);  Surgeon: Cynthia Cowper, MD;  Location: [facility];  Service: Urology;  Laterality: Left;   JOINT REPLACEMENT     right knee 11/2015 Dr. Janell Quiet [facility] N/A 05/26/2021   Procedure: ANORECTAL EXAM UNDER ANESTHESIA;  Surgeon: Ileana Roup, MD;  Location: [facility];  Service: General;  Laterality: N/A;   TRANSANAL EXCISION OF RECTAL MASS N/A 05/26/2021   Procedure: TRANSANAL EXCISION OF RIGHT ANTERIOR LATERAL DISTAL RECTAL CANCER;  Surgeon: Ileana Roup, MD;  Location: [facility];  Service: General;  Laterality: N/A;    Family History  Problem Relation Age of Onset   Stroke Mother    Breast cancer Mother 20   Cancer Mother        breast cancer s/p masectomy lived to be 73    Hypertension Mother    Cancer Father  NMSC skin cancer    Gout Father    Heart disease Father        s/p bypass surgery age 61 lived 57 months after surgery    Kidney disease Father        RAS>kidney failure    Bladder Cancer Brother    Hyperlipidemia Paternal Aunt    Stroke Paternal Aunt    Breast cancer Paternal Aunt    Rectal cancer Paternal Aunt    Kidney cancer Neg Hx    Prolactinoma Neg Hx     Social:  reports that she quit smoking about 28 years ago. Her smoking use included cigarettes. She has a 25.00 pack-year smoking history. She has never used smokeless tobacco. She reports that she does not drink alcohol and does not use drugs.  Allergies:  Allergies  Allergen Reactions   Nortriptyline Nausea Only   Sulfa Antibiotics Rash    Medications: I have reviewed the patient's current medications.  No results found for this or any previous visit (from the past 48 hour(s)).  No results found.   PE Blood pressure (!) 194/59, pulse (!) 105, temperature 98 F (36.7 C),  temperature source Temporal, resp. rate 20, weight 90.7 kg, SpO2 98 %. Constitutional: NAD; conversant Eyes: Moist conjunctiva; Lungs: Normal respiratory effort CV: RRR MSK: Normal range of motion of extremities; Psychiatric: Appropriate affect  No results found for this or any previous visit (from the past 48 hour(s)).  No results found.  A/P: MILLA MCPETERS is an 77 y.o. female with hx of HTN, HLD, GERD, gout here for evaluation of newly diagnosed distal rectal/anorectal mod diff adenocarcinoma  CmriT2N0, no lvi/pni  OR 05/25/21 - TAE - distal rectal/anorectal mass - pT2NxMx invasive mod diff adenoCA, margin negative  -Case presented at [facility] and initially prior to surgery -Completed cXRT 08/15/21 -Doing well. No evidence of recurrence at present but we are unable to reliably visualize the area in her distal rectum under anoscopy today 2/2 not unexpected anal canal stenosis. -We have therefore recommended diagnostic flexible sigmoidoscopy with anorectal exam under anesthesia for further evaluation of this area. Possible biopsy if any abnormal findings are noted.  -The planned procedure, material risks (including, but not limited to, pain, bleeding, infection, perforation, scarring, need for additional procedures, recurrence, pneumonia, heart attack, stroke) benefits and alternatives to surgery were discussed at length. The patient's questions were answered to her satisfaction, she voiced understanding and elected to proceed with surgery. Additionally, we discussed typical postoperative expectations and the recovery process.  Nadeen Landau, [facility], [place]

## 2022-04-27 NOTE — Anesthesia Preprocedure Evaluation (Addendum)
Anesthesia Evaluation  Patient identified by MRN, date of birth, ID band Patient awake    Reviewed: Allergy & Precautions, NPO status , Patient's Chart, lab work & pertinent test results  History of Anesthesia Complications Negative for: history of anesthetic complications  Airway Mallampati: III  TM Distance: >3 FB Neck ROM: Full   Comment: Requires video laryngoscopy for intubation Dental  (+) Dental Advisory Given   Pulmonary neg shortness of breath, neg sleep apnea, neg COPD, neg recent URI, former smoker   Pulmonary exam normal breath sounds clear to auscultation       Cardiovascular hypertension (amlodipine, triamterene-HCTZ), Pt. on medications (-) angina + DOE  (-) Past MI, (-) Cardiac Stents and (-) CABG + dysrhythmias (RBBB)  Rhythm:Regular Rate:Normal  HLD   Neuro/Psych neg Seizures  Neuromuscular disease (lumbar radiculopathy, sciatica)    GI/Hepatic hiatal hernia, PUD,GERD  Medicated,,Fatty liver H/o rectal cancer   Endo/Other  Pre-diabetes  Renal/GU H/o stones     Musculoskeletal  (+) Arthritis ,    Abdominal  (+) + obese  Peds  Hematology  (+) Blood dyscrasia (Factor V Leiden, on Lovenox)   Anesthesia Other Findings Rosacea, gout  Reproductive/Obstetrics                             Anesthesia Physical Anesthesia Plan  ASA: 3  Anesthesia Plan: MAC   Post-op Pain Management:    Induction: Intravenous  PONV Risk Score and Plan: 2 and Propofol infusion  Airway Management Planned: Natural Airway and Nasal Cannula  Additional Equipment:   Intra-op Plan:   Post-operative Plan:   Informed Consent: I have reviewed the patients History and Physical, chart, labs and discussed the procedure including the risks, benefits and alternatives for the proposed anesthesia with the patient or authorized representative who has indicated his/her understanding and acceptance.      Dental advisory given  Plan Discussed with: CRNA and Anesthesiologist  Anesthesia Plan Comments: (Discussed with patient risks of MAC including, but not limited to, minor pain or discomfort, hearing people in the room, and possible need for backup general anesthesia. Risks for general anesthesia also discussed including, but not limited to, sore throat, hoarse voice, chipped/damaged teeth, injury to vocal cords, nausea and vomiting, allergic reactions, lung infection, heart attack, stroke, and death. All questions answered. )        Anesthesia Quick Evaluation

## 2022-04-27 NOTE — Anesthesia Postprocedure Evaluation (Signed)
Anesthesia Post Note  Patient: [facility]  Procedure(s) Performed: DIAGNOSTIC FLEXIBLE SIGMOIDOSCOPY, ANORECTAL EXAM UNDER ANESTHESIA, POSSIBLE RECTAL BIOPSY BIOPSY     Patient location during evaluation: PACU Anesthesia Type: MAC Level of consciousness: awake Pain management: pain level controlled Vital Signs Assessment: post-procedure vital signs reviewed and stable Respiratory status: spontaneous breathing, nonlabored ventilation and respiratory function stable Cardiovascular status: stable and blood pressure returned to baseline Postop Assessment: no apparent nausea or vomiting Anesthetic complications: no   No notable events documented.  Last Vitals:  Vitals:   04/27/22 1240 04/27/22 1249  BP: (!) 167/61 (!) 160/52  Pulse: 89 80  Resp: 15 19  Temp:    SpO2: 100% 98%    Last Pain:  Vitals:   04/27/22 1240  TempSrc:   PainSc: 0-No pain                 Nilda Simmer

## 2022-04-27 NOTE — Discharge Instructions (Signed)
YOU HAD AN ENDOSCOPIC PROCEDURE TODAY: Refer to the procedure report and other information in the discharge instructions given to you for any specific questions about what was found during the examination. If this information does not answer your questions, please call your doctor. YOU SHOULD EXPECT: Some feelings of bloating in the abdomen. Passage of more gas than usual. Walking can help get rid of the air that was put into your GI tract during the procedure and reduce the bloating. If you had a lower endoscopy (such as a colonoscopy or flexible sigmoidoscopy) you may notice spotting of blood in your stool or on the toilet paper. Some abdominal soreness may be present for a day or two, also.  DIET: Your first meal following the procedure should be a light meal and then it is ok to progress to your normal diet. A half-sandwich or bowl of soup is an example of a good first meal. Heavy or fried foods are harder to digest and may make you feel nauseous or bloated. Drink plenty of fluids but you should avoid alcoholic beverages for 24 hours. If you had a esophageal dilation, please see attached instructions for diet.    ACTIVITY: Your care partner should take you home directly after the procedure. You should plan to take it easy, moving slowly for the rest of the day. You can resume normal activity the day after the procedure however YOU SHOULD NOT DRIVE, use power tools, machinery or perform tasks that involve climbing or major physical exertion for 24 hours (because of the sedation medicines used during the test).   SYMPTOMS TO REPORT IMMEDIATELY: Following lower endoscopy (colonoscopy, flexible sigmoidoscopy) Excessive amounts of blood in the stool  Significant tenderness, worsening of abdominal pains  Swelling of the abdomen that is new, acute  Fever of 100 or higher

## 2022-04-27 NOTE — Transfer of Care (Signed)
Immediate Anesthesia Transfer of Care Note  Patient: [facility]  Procedure(s) Performed: DIAGNOSTIC FLEXIBLE SIGMOIDOSCOPY, ANORECTAL EXAM UNDER ANESTHESIA, POSSIBLE RECTAL BIOPSY BIOPSY  Patient Location: Endoscopy Unit  Anesthesia Type:General  Level of Consciousness: awake, alert , and oriented  Airway & Oxygen Therapy: Patient Spontanous Breathing  Post-op Assessment: Report given to RN and Post -op Vital signs reviewed and stable  Post vital signs: Reviewed and stable  Last Vitals:  Vitals Value Taken Time  BP 163/62 04/27/22 1229  Temp 36.7 C 04/27/22 1229  Pulse 85 04/27/22 1229  Resp 19 04/27/22 1229  SpO2 100 % 04/27/22 1229  Vitals shown include unvalidated device data.  Last Pain:  Vitals:   04/27/22 1229  TempSrc: Temporal  PainSc: 0-No pain         Complications: No notable events documented.

## 2022-04-30 ENCOUNTER — Encounter (HOSPITAL_COMMUNITY): Payer: Self-pay | Admitting: Surgery

## 2022-04-30 LAB — SURGICAL PATHOLOGY

## 2022-05-02 ENCOUNTER — Other Ambulatory Visit: Payer: Self-pay | Admitting: Physical Medicine & Rehabilitation

## 2022-05-02 DIAGNOSIS — M5441 Lumbago with sciatica, right side: Secondary | ICD-10-CM

## 2022-05-02 DIAGNOSIS — M5442 Lumbago with sciatica, left side: Secondary | ICD-10-CM

## 2022-05-17 ENCOUNTER — Ambulatory Visit
Admission: RE | Admit: 2022-05-17 | Discharge: 2022-05-17 | Disposition: A | Discharge status: Home / Self Care | Payer: Medicare HMO | Source: Ambulatory Visit | Attending: Physical Medicine & Rehabilitation | Admitting: Physical Medicine & Rehabilitation

## 2022-05-17 DIAGNOSIS — M5441 Lumbago with sciatica, right side: Secondary | ICD-10-CM

## 2022-05-17 DIAGNOSIS — M5442 Lumbago with sciatica, left side: Secondary | ICD-10-CM

## 2022-05-17 MED ORDER — GADOPICLENOL 0.5 MMOL/ML IV SOLN
8.5000 mL | Freq: Once | INTRAVENOUS | Status: AC | PRN
  Administered 2022-05-17: 8.5 mL via INTRAVENOUS

## 2022-05-25 ENCOUNTER — Ambulatory Visit (HOSPITAL_COMMUNITY)
Admission: RE | Admit: 2022-05-25 | Discharge: 2022-05-25 | Disposition: A | Discharge status: Home / Self Care | Payer: Medicare HMO | Source: Ambulatory Visit | Attending: Hematology | Admitting: Hematology

## 2022-05-25 DIAGNOSIS — C20 Malignant neoplasm of rectum: Secondary | ICD-10-CM | POA: Diagnosis present

## 2022-06-08 ENCOUNTER — Telehealth: Payer: Self-pay | Admitting: Hematology

## 2022-06-08 NOTE — Telephone Encounter (Signed)
Called patient to scheduled an appointment to discuss mri results, patient advised on wanting to hold off on an appointment because she can not walk right now and is in a lot of pain.I let provider know and told patient we would call her back with an update.

## 2022-06-26 ENCOUNTER — Telehealth: Payer: Self-pay

## 2022-06-26 NOTE — Telephone Encounter (Addendum)
Called and relayed message to patient as per Dr. Mosetta Putt, patient voiced full understanding.   ----- Message from Malachy Mood, MD sent at 06/26/2022  1:40 PM EDT ----- Pt has no follow up scheduled, she is not able to come in due to mobility issue. Please let her know the MRI results, no concerns, and encourage her to call back to schedule her f/u when she is able to, thanks   [employer]

## 2022-10-04 ENCOUNTER — Ambulatory Visit: Payer: Medicare HMO | Admitting: Family Medicine

## 2022-10-09 ENCOUNTER — Ambulatory Visit: Payer: Medicare HMO | Admitting: Family Medicine

## 2023-02-22 ENCOUNTER — Ambulatory Visit: Payer: Medicare HMO

## 2023-02-22 DIAGNOSIS — Z1211 Encounter for screening for malignant neoplasm of colon: Secondary | ICD-10-CM | POA: Diagnosis present

## 2023-02-22 DIAGNOSIS — K640 First degree hemorrhoids: Secondary | ICD-10-CM | POA: Diagnosis not present

## 2023-02-22 DIAGNOSIS — Z9889 Other specified postprocedural states: Secondary | ICD-10-CM | POA: Diagnosis not present

## 2023-02-22 DIAGNOSIS — Z85048 Personal history of other malignant neoplasm of rectum, rectosigmoid junction, and anus: Secondary | ICD-10-CM | POA: Diagnosis not present

## 2023-02-22 DIAGNOSIS — K602 Anal fissure, unspecified: Secondary | ICD-10-CM | POA: Diagnosis not present

## 2023-02-28 LAB — MOLECULAR PATHOLOGY

## 2023-03-25 ENCOUNTER — Telehealth: Payer: Self-pay | Admitting: *Deleted

## 2023-03-25 DIAGNOSIS — C20 Malignant neoplasm of rectum: Secondary | ICD-10-CM

## 2023-03-25 NOTE — Telephone Encounter (Signed)
Patient called to get set up with MRI and follow up. She has not had an MRI since last March and needs to have surveillance done for her rectal cancer per notes every 6 months.  Placed order per expired MRI order. Message sent to schedulers. Patient notified scheduler would be calling to make appointments.

## 2023-03-31 NOTE — Assessment & Plan Note (Addendum)
Adenocarcinoma of the distal rectum, G2, pT2, cN0M0 stage IA, MMR normal -positive routine Cologuard with PCP, she was asymptomatic. Colonoscopy on 04/17/21 showed 2 cm distal rectal mass that was palpable on DRE. Path confirmed invasive moderately differentiated adenocarcinoma. -local staging MRI on 04/28/21 showed cT2N0 disease without lymphovascular invasion -staging CT on 05/09/21 was negative for distant metastasis -upfront transanal excision on 05/26/21 with Dr. Cliffton Asters showed 2.5 cm tumor, negative margins, MMR normal -due to moderate risk of recurrence, she received adjuvant concurrent chemoRT with Xeloda on 07/03/21 - 08/15/21. She developed a rash to the back of her hands and chest, as well as diarrhea, otherwise tolerated well. -post-treatment anoscope by Dr. Cliffton Asters on 10/18/21 was benign. -post-treatment pelvic MRI on 11/27/21 showed NED.  -Pelvic MRI without contrast with rectal cancer protocol showing no evidence of recurrence or metastatic lymphadenopathy in the pelvis. -Screening colonoscopy done on 03/07/2023.  This showed nonbleeding internal hemorrhoids and diverticulosis.  The exam was otherwise normal.

## 2023-03-31 NOTE — Progress Notes (Unsigned)
Patient Care Team: Marguarite Arbour, MD as PCP - General (Internal Medicine) Malachy Mood, MD as Consulting Physician (Hematology and Oncology) Carlean Jews, NP as Nurse Practitioner (Hematology and Oncology)  Clinic Day:  04/01/2023  Referring physician: Marguarite Arbour, MD  ASSESSMENT & PLAN:   Assessment & Plan: Rectal cancer [facility]) Adenocarcinoma of the distal rectum, G2, pT2, cN0M0 stage IA, MMR normal -positive routine Cologuard with PCP, she was asymptomatic. Colonoscopy on 04/17/21 showed 2 cm distal rectal mass that was palpable on DRE. Path confirmed invasive moderately differentiated adenocarcinoma. -local staging MRI on 04/28/21 showed cT2N0 disease without lymphovascular invasion -staging CT on 05/09/21 was negative for distant metastasis -upfront transanal excision on 05/26/21 with Dr. Cliffton Asters showed 2.5 cm tumor, negative margins, MMR normal -due to moderate risk of recurrence, she received adjuvant concurrent chemoRT with Xeloda on 07/03/21 - 08/15/21. She developed a rash to the back of her hands and chest, as well as diarrhea, otherwise tolerated well. -post-treatment anoscope by Dr. Cliffton Asters on 10/18/21 was benign. -post-treatment pelvic MRI on 11/27/21 showed NED.  -Pelvic MRI without contrast with rectal cancer protocol done 05/2022, showing no evidence of recurrence or metastatic lymphadenopathy in the pelvis. -Screening colonoscopy done on 03/07/2023.  This showed nonbleeding internal hemorrhoids and diverticulosis.  The exam was otherwise normal. -04/01/2023 - surveillance pelvic MRI with rectal cancer protocol has been ordered. Encouraged patient to follow up with her surgeon in next few months (Dr. Cliffton Asters).  -continue with screening colonoscopies as recommended    Plan: Labs reviewed  -CBC showing WBC 5.8; Hgb 13.5; Hct 41.5; Plt 211; Anc 3.7 -CMP - K 4.0; glucose 96; BUN 21; Creatinine 0.24; eGFR >60; Ca 10.0; LFTs normal.   -CEA pending.  Pelvic MRI with rectal cancer  protocol already ordered. Patient given radiology number to call and schedule as soon as possible.  Follow up with Dr. Cliffton Asters in next few months.  Labs and follow up with Dr. Mosetta Putt in 6 months.   The patient understands the plans discussed today and is in agreement with them.  She knows to contact our office if she develops concerns prior to her next appointment.  I provided 25 minutes of face-to-face time during this encounter and > 50% was spent counseling as documented under my assessment and plan.    Carlean Jews, NP  [facility] - A DEPT OF [facility] [address] [place] [address] Dept: [phone] Dept Fax: [phone]   No orders of the defined types were placed in this encounter.     CHIEF COMPLAINT:  CC: Rectal cancer  Current Treatment: Surveillance  INTERVAL HISTORY:  Cynthia Bryan is here today for repeat clinical assessment.  She was last seen in the office on 11/29/2021 by Dr. Mosetta Putt.  She is overdue for surveillance MRI for her rectal cancer.  Last repeat MRI done 05/25/2022 showing no evidence of residual or recurrent primary lesion and no pelvic adenopathy.  Screening colonoscopy done on 03/07/2023 showed nonbleeding, internal hemorrhoids and diverticulosis.  Was otherwise normal. She states that she got off surveillance schedule last year, shortly after having her MRI in 05/2022. She states that she suddenly developed bilateral leg weakness which was so severe she was nearly unable to walk. 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 She had several epidural injections followed by physical therapy.  She states that it  took several months for her to be able to get to near baseline. There are some days, she still uses a cane to help with balance and mobility, but mostly, does ok without now. She reports using miralax every evening. Takes  1/2 capful to prevent herself from getting constipated. She denies nausea or vomiting. Denies diarrhea. She denies fevers or chills. She denies pain. Her appetite is good. Her weight has been stable.  I have reviewed the past medical history, past surgical history, social history and family history with the patient and they are unchanged from previous note.  ALLERGIES:  is allergic to nortriptyline and sulfa antibiotics.  MEDICATIONS:  Current Outpatient Medications  Medication Sig Dispense Refill   acetaminophen (TYLENOL) 650 MG CR tablet Take 1,300 mg by mouth 2 (two) times daily.     allopurinol (ZYLOPRIM) 300 MG tablet Take 1 tablet (300 mg total) by mouth daily. 90 tablet 3   amLODipine (NORVASC) 2.5 MG tablet Take 1-2 tablets (2.5-5 mg total) by mouth daily. (Patient taking differently: Take 5 mg by mouth daily.) 180 tablet 3   atorvastatin (LIPITOR) 40 MG tablet Take 1 tablet (40 mg total) by mouth daily at 6 PM. 90 tablet 3   Cyanocobalamin (B-12) 5000 MCG CAPS Take 5,000 mcg by mouth daily.      estradiol (ESTRACE) 0.1 MG/GM vaginal cream Estrogen Cream Instruction Discard applicator Apply pea sized amount to tip of finger to urethra before bed. Wash hands well after application. Use Monday, Wednesday and Friday 42.5 g 12   ezetimibe (ZETIA) 10 MG tablet Take 1 tablet (10 mg total) by mouth at bedtime. 90 tablet 3   gabapentin (NEURONTIN) 300 MG capsule Take 1 capsule (300 mg total) by mouth at bedtime. 90 capsule 3   Magnesium 500 MG CAPS Take 500 mg by mouth at bedtime.      omeprazole (PRILOSEC) 20 MG capsule Take 1 capsule (20 mg total) by mouth daily. 30 min before food (Patient taking differently: Take 20 mg by mouth every evening. 30 min before food) 90 capsule 3   potassium chloride (MICRO-K) 10 MEQ CR capsule Take 3 capsules (30 mEq total) by mouth every morning. (Patient taking differently: Take 20 mEq by mouth every morning.) 360 capsule 3   traMADol (ULTRAM) 50 MG tablet  Takes 1 tab in am , 2 at hs 170 tablet 5   triamterene-hydrochlorothiazide (MAXZIDE-25) 37.5-25 MG tablet Take 1 tablet by mouth every morning. 90 tablet 3   No current facility-administered medications for this visit.    HISTORY OF PRESENT ILLNESS:   Oncology History  Rectal cancer (HCC)  04/25/2021 Initial Biopsy   Colon[place] with Dr. Norma Fredrickson in [place], [place] -Per the notes in epic, she was found to have an infiltrative, sessile, ulcerated nonobstructing mass in the distal rectum measuring 2 cm in length and diameter, 0 to 3 cm from the anal verge. 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   04/25/2021 Initial Diagnosis   Rectal cancer (HCC)   04/25/2021 Cancer Staging   Staging form: Colon and Rectum, AJCC 8th Edition - Clinical stage from 04/25/2021: Stage I (cT2, cN0, cM0) - Signed by [name], NP on 06/19/2021 Stage prefix: Initial diagnosis Total positive nodes: 0   04/25/2021 Tumor Marker   Baseline CEA: 1.8 (normal)   04/28/2021 Imaging   Local staging pelvic MRI: IMPRESSION: TUMOR DESCRIPTION Circumferential Extent: 100 degrees from 9 o'clock to 5 o'clock along the posterior RIGHT  border of the rectum.   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 Lesion at anorectal junction.     05/09/2021 Imaging   Staging CT chest/abdomen IMPRESSION: 1. No evidence of metastatic disease within the chest or abdomen. 2. Hepatic steatosis. 3. Punctate nonobstructive left renal stone. 4. Bilateral renal cysts, including a hemorrhagic/proteinaceous right upper pole renal cyst. 5.  Aortic Atherosclerosis (ICD10-I70.0).   05/26/2021 Surgery   Transanal excision of distal rectal cancer by Dr. Marin Olp   05/26/2021 Pathology Results   A. RECTUM, RIGHT ANTERIOR LATERAL  DISTAL, RESECTION:  - Invasive moderately differentiated adenocarcinoma, 2.5 cm  - Carcinoma focally invades into superficial muscularis propria  - Resection margins are negative for carcinoma  - Lateral mucosal margins are negative for dysplasia  - No evidence of lymphovascular invasion  - See oncology table  pT2, PN X IHC EXPRESSION RESULTS  TEST           RESULT  MLH1:          Preserved nuclear expression  MSH2:          Preserved nuclear expression  MSH6:          Preserved nuclear expression  PMS2:          Preserved nuclear expression     05/26/2021 Cancer Staging   Staging form: Colon and Rectum, AJCC 8th Edition - Pathologic stage from 05/26/2021: Stage Unknown (pT2, pNX, cM0) - Signed by [name], NP on 06/19/2021    Genetic Testing   Negative genetic testing. No pathogenic variants identified on the [employer] CustomNext+RNA panel. The report date is 07/14/2021.  The CustomNext-Cancer+RNAinsight panel offered by [employer] includes sequencing and rearrangement analysis for the following 47 genes:  APC, ATM, AXIN2, BARD1, BMPR1A, BRCA1, BRCA2, BRIP1, CDH1, CDK4, CDKN2A, CHEK2, DICER1, EPCAM, GREM1, HOXB13, MEN1, MLH1, MSH2, MSH3, MSH6, MUTYH, NBN, NF1, NF2, NTHL1, PALB2, PMS2, POLD1, POLE, PTEN, RAD51C, RAD51D, RECQL, RET, SDHA, SDHAF2, SDHB, SDHC, SDHD, SMAD4, SMARCA4, STK11, TP53, TSC1, TSC2, and VHL.  RNA data is routinely analyzed for use in variant interpretation for all genes.   05/2022 Imaging   MRI of the pelvis without contrast for rectal cancer surveillance IMPRESSION: No evidence of residual or recurrent primary lesion. No pelvic adenopathy.     03/07/2023 Procedure   Screening colonoscopy  1.  Nonbleeding, internal hemorrhoids 2.  Diverticulosis 3.  Otherwise normal exam.       REVIEW OF SYSTEMS:   Constitutional: Denies fevers, chills or abnormal weight loss Eyes: Denies blurriness of vision Ears, nose, mouth, throat, and face: Denies mucositis or  sore throat Respiratory: Denies cough, dyspnea or wheezes Cardiovascular: Denies palpitation, chest discomfort or lower extremity swelling Gastrointestinal:  Denies nausea, heartburn or change in bowel habits Skin: Denies abnormal skin rashes Lymphatics: Denies new lymphadenopathy or easy bruising Neurological:Denies numbness, tingling or new weaknesses Behavioral/Psych: Mood is stable, no new changes  All other systems were reviewed with the patient and are negative.   VITALS:   Today's Vitals   04/01/23 1021 04/01/23 1030  BP: (!) 149/55   Pulse: 72   Resp: 19   Temp: 97.7 F (36.5 C)   TempSrc: Temporal   SpO2: 100%   Weight: 208 lb 14.4 oz (94.8 kg)   PainSc:  0-No pain   Body mass index is 33.72 kg/m.   Wt Readings from Last 3  Encounters:  04/01/23 208 lb 14.4 oz (94.8 kg)  04/20/22 200 lb (90.7 kg)  12/05/21 203 lb 9.6 oz (92.4 kg)    Body mass index is 33.72 kg/m.  Performance status (ECOG): 0 - Asymptomatic  PHYSICAL EXAM:   GENERAL:alert, no distress and comfortable SKIN: skin color, texture, turgor are normal, no rashes or significant lesions EYES: normal, Conjunctiva are pink and non-injected, sclera clear OROPHARYNX:no exudate, no erythema and lips, buccal mucosa, and tongue normal  NECK: supple, thyroid normal size, non-tender, without nodularity LYMPH:  no palpable lymphadenopathy in the cervical, axillary or inguinal LUNGS: clear to auscultation and percussion with normal breathing effort HEART: regular rate & rhythm and no murmurs and no lower extremity edema ABDOMEN:abdomen soft, non-tender and normal bowel sounds Musculoskeletal:no cyanosis of digits and no clubbing  NEURO: alert & oriented x 3 with fluent speech, no focal motor/sensory deficits  LABORATORY DATA:  I have reviewed the data as listed    Component Value Date/Time   NA 138 04/01/2023 0939   K 4.0 04/01/2023 0939   CL 101 04/01/2023 0939   CO2 29 04/01/2023 0939   GLUCOSE 96  04/01/2023 0939   BUN 21 04/01/2023 0939   CREATININE 0.84 04/01/2023 0939   CALCIUM 10.0 04/01/2023 0939   PROT 6.8 04/01/2023 0939   ALBUMIN 4.3 04/01/2023 0939   AST 10 (L) 04/01/2023 0939   ALT 18 04/01/2023 0939   ALKPHOS 101 04/01/2023 0939   BILITOT 0.8 04/01/2023 0939   GFRNONAA >60 04/01/2023 0939   GFRAA >60 10/26/2019 1248    Lab Results  Component Value Date   WBC 5.8 04/01/2023   NEUTROABS 3.9 04/01/2023   HGB 13.5 04/01/2023   HCT 41.5 04/01/2023   MCV 94.3 04/01/2023   PLT 211 04/01/2023

## 2023-04-01 ENCOUNTER — Inpatient Hospital Stay: Payer: Medicare HMO | Attending: Nurse Practitioner

## 2023-04-01 ENCOUNTER — Encounter: Payer: Self-pay | Admitting: Nurse Practitioner

## 2023-04-01 ENCOUNTER — Inpatient Hospital Stay (HOSPITAL_BASED_OUTPATIENT_CLINIC_OR_DEPARTMENT_OTHER): Payer: Medicare HMO | Admitting: Nurse Practitioner

## 2023-04-01 ENCOUNTER — Other Ambulatory Visit: Payer: Self-pay

## 2023-04-01 VITALS — BP 149/55 | HR 72 | Temp 97.7°F | Resp 19 | Wt 208.9 lb

## 2023-04-01 DIAGNOSIS — C20 Malignant neoplasm of rectum: Secondary | ICD-10-CM

## 2023-04-01 DIAGNOSIS — Z85048 Personal history of other malignant neoplasm of rectum, rectosigmoid junction, and anus: Secondary | ICD-10-CM | POA: Diagnosis present

## 2023-04-01 LAB — CMP (CANCER CENTER ONLY)
ALT: 18 U/L (ref 0–44)
AST: 10 U/L — ABNORMAL LOW (ref 15–41)
Albumin: 4.3 g/dL (ref 3.5–5.0)
Alkaline Phosphatase: 101 U/L (ref 38–126)
Anion gap: 8 (ref 5–15)
BUN: 21 mg/dL (ref 8–23)
CO2: 29 mmol/L (ref 22–32)
Calcium: 10 mg/dL (ref 8.9–10.3)
Chloride: 101 mmol/L (ref 98–111)
Creatinine: 0.84 mg/dL (ref 0.44–1.00)
GFR, Estimated: >60 mL/min
Glucose, Bld: 96 mg/dL (ref 70–99)
Potassium: 4 mmol/L (ref 3.5–5.1)
Sodium: 138 mmol/L (ref 135–145)
Total Bilirubin: 0.8 mg/dL (ref 0.0–1.2)
Total Protein: 6.8 g/dL (ref 6.5–8.1)

## 2023-04-01 LAB — CBC WITH DIFFERENTIAL (CANCER CENTER ONLY)
Abs Immature Granulocytes: 0.01 10*3/uL (ref 0.00–0.07)
Basophils Absolute: 0 10*3/uL (ref 0.0–0.1)
Basophils Relative: 1 %
Eosinophils Absolute: 0.2 10*3/uL (ref 0.0–0.5)
Eosinophils Relative: 3 %
HCT: 41.5 % (ref 36.0–46.0)
Hemoglobin: 13.5 g/dL (ref 12.0–15.0)
Immature Granulocytes: 0 %
Lymphocytes Relative: 22 %
Lymphs Abs: 1.2 10*3/uL (ref 0.7–4.0)
MCH: 30.7 pg (ref 26.0–34.0)
MCHC: 32.5 g/dL (ref 30.0–36.0)
MCV: 94.3 fL (ref 80.0–100.0)
Monocytes Absolute: 0.4 10*3/uL (ref 0.1–1.0)
Monocytes Relative: 7 %
Neutro Abs: 3.9 10*3/uL (ref 1.7–7.7)
Neutrophils Relative %: 67 %
Platelet Count: 211 10*3/uL (ref 150–400)
RBC: 4.4 MIL/uL (ref 3.87–5.11)
RDW: 14.9 % (ref 11.5–15.5)
WBC Count: 5.8 10*3/uL (ref 4.0–10.5)
nRBC: 0 % (ref 0.0–0.2)

## 2023-04-01 LAB — CEA (ACCESS): CEA (CHCC): 1.59 ng/mL (ref 0.00–5.00)

## 2023-04-02 ENCOUNTER — Telehealth: Payer: Self-pay | Admitting: Nurse Practitioner

## 2023-04-02 NOTE — Telephone Encounter (Signed)
Patient is aware of scheduled appointment times/dates

## 2023-05-27 ENCOUNTER — Ambulatory Visit (HOSPITAL_COMMUNITY)
Admission: RE | Admit: 2023-05-27 | Discharge: 2023-05-27 | Disposition: A | Discharge status: Home / Self Care | Source: Ambulatory Visit | Attending: Hematology | Admitting: Hematology

## 2023-05-27 DIAGNOSIS — C20 Malignant neoplasm of rectum: Secondary | ICD-10-CM | POA: Diagnosis present

## 2023-06-25 ENCOUNTER — Other Ambulatory Visit: Payer: Self-pay

## 2023-07-01 ENCOUNTER — Telehealth: Payer: Self-pay

## 2023-07-01 NOTE — Telephone Encounter (Addendum)
 Called patient to relay message below as per Dr. Maryalice Smaller, patient voiced full understanding and had no further questions at this time.   ----- Message from Sonja Chili sent at 06/21/2023 12:27 PM EDT ----- Please make sure pt knows her MRI result, which showed NED.   Thanks   Sonja Cicero

## 2023-09-30 ENCOUNTER — Other Ambulatory Visit: Payer: Self-pay

## 2023-09-30 DIAGNOSIS — C20 Malignant neoplasm of rectum: Secondary | ICD-10-CM

## 2023-10-01 ENCOUNTER — Inpatient Hospital Stay: Payer: Medicare HMO | Attending: Hematology

## 2023-10-01 ENCOUNTER — Inpatient Hospital Stay (HOSPITAL_BASED_OUTPATIENT_CLINIC_OR_DEPARTMENT_OTHER): Payer: Medicare HMO | Admitting: Hematology

## 2023-10-01 VITALS — BP 158/60 | HR 80 | Temp 97.6°F | Resp 19 | Wt 216.9 lb

## 2023-10-01 DIAGNOSIS — C20 Malignant neoplasm of rectum: Secondary | ICD-10-CM | POA: Diagnosis not present

## 2023-10-01 DIAGNOSIS — Z9221 Personal history of antineoplastic chemotherapy: Secondary | ICD-10-CM | POA: Insufficient documentation

## 2023-10-01 DIAGNOSIS — Z85048 Personal history of other malignant neoplasm of rectum, rectosigmoid junction, and anus: Secondary | ICD-10-CM | POA: Insufficient documentation

## 2023-10-01 DIAGNOSIS — Z923 Personal history of irradiation: Secondary | ICD-10-CM | POA: Diagnosis not present

## 2023-10-01 LAB — CMP (CANCER CENTER ONLY)
ALT: 12 U/L (ref 0–44)
AST: 10 U/L — ABNORMAL LOW (ref 15–41)
Albumin: 4.1 g/dL (ref 3.5–5.0)
Alkaline Phosphatase: 104 U/L (ref 38–126)
Anion gap: 9 (ref 5–15)
BUN: 19 mg/dL (ref 8–23)
CO2: 30 mmol/L (ref 22–32)
Calcium: 9.6 mg/dL (ref 8.9–10.3)
Chloride: 100 mmol/L (ref 98–111)
Creatinine: 0.93 mg/dL (ref 0.44–1.00)
GFR, Estimated: >60 mL/min
Glucose, Bld: 109 mg/dL — ABNORMAL HIGH (ref 70–99)
Potassium: 4 mmol/L (ref 3.5–5.1)
Sodium: 139 mmol/L (ref 135–145)
Total Bilirubin: 0.8 mg/dL (ref 0.0–1.2)
Total Protein: 7 g/dL (ref 6.5–8.1)

## 2023-10-01 LAB — CBC WITH DIFFERENTIAL (CANCER CENTER ONLY)
Abs Immature Granulocytes: 0.02 10*3/uL (ref 0.00–0.07)
Basophils Absolute: 0 10*3/uL (ref 0.0–0.1)
Basophils Relative: 1 %
Eosinophils Absolute: 0.2 10*3/uL (ref 0.0–0.5)
Eosinophils Relative: 4 %
HCT: 41.1 % (ref 36.0–46.0)
Hemoglobin: 13.4 g/dL (ref 12.0–15.0)
Immature Granulocytes: 0 %
Lymphocytes Relative: 19 %
Lymphs Abs: 1.2 10*3/uL (ref 0.7–4.0)
MCH: 31.2 pg (ref 26.0–34.0)
MCHC: 32.6 g/dL (ref 30.0–36.0)
MCV: 95.6 fL (ref 80.0–100.0)
Monocytes Absolute: 0.4 10*3/uL (ref 0.1–1.0)
Monocytes Relative: 7 %
Neutro Abs: 4.6 10*3/uL (ref 1.7–7.7)
Neutrophils Relative %: 70 %
Platelet Count: 231 10*3/uL (ref 150–400)
RBC: 4.3 MIL/uL (ref 3.87–5.11)
RDW: 14.9 % (ref 11.5–15.5)
WBC Count: 6.5 10*3/uL (ref 4.0–10.5)

## 2023-10-01 LAB — CEA (ACCESS): CEA (CHCC): 1.26 ng/mL (ref 0.00–5.00)

## 2023-10-01 NOTE — Progress Notes (Signed)
 [facility]   [phone] [phone]   Clinic Follow up Note   Patient Care Team: Auston Reyes BIRCH, MD as PCP - General (Internal Medicine) Lanny Callander, MD as Consulting Physician (Hematology and Oncology) Hanford Powell BRAVO, NP as Nurse Practitioner (Hematology and Oncology)  Date of Service:  10/01/2023  CHIEF COMPLAINT: f/u of rectal cancer  CURRENT THERAPY:  Cancer surveillance  Oncology History   Rectal cancer [facility]) Adenocarcinoma of the distal rectum, G2, pT2, cN0M0 stage IA, MMR normal -positive routine Cologuard with PCP, she was asymptomatic. Colonoscopy on 04/17/21 showed 2 cm distal rectal mass that was palpable on DRE. Path confirmed invasive moderately differentiated adenocarcinoma. -local staging MRI on 04/28/21 showed cT2N0 disease without lymphovascular invasion -staging CT on 05/09/21 was negative for distant metastasis -upfront transanal excision on 05/26/21 with Dr. Teresa showed 2.5 cm tumor, negative margins, MMR normal -due to moderate risk of recurrence, she received adjuvant concurrent chemoRT with Xeloda  on 07/03/21 - 08/15/21. She developed a rash to the back of her hands and chest, as well as diarrhea, otherwise tolerated well. -post-treatment anoscope by Dr. Teresa on 10/18/21 was benign. -post-treatment pelvic MRI on 11/27/21 showed NED -continue cancer surveillance   Assessment & Plan Rectal cancer, status post surgery and chemoradiation, under surveillance Rectal cancer diagnosed two and a half years ago, treated with surgery and chemoradiation. Currently under surveillance with no evidence of recurrence. Previous CT scan in 2023 was negative. - Continue MRI surveillance every 6 months, will change to once a year after next - Last colonoscopy was negative in early 2025, and will repeat in 1 year - Due to early stage disease, no need routine CT scan. - Due to previous radiation, she is not able to tolerate digital rectal  exam.   Chronic low back pain, post-radiation and hysterectomy, improved Chronic low back pain following radiation and hysterectomy, previously severe enough to impair mobility. Managed with epidurals and physical therapy, resulting in significant improvement. Currently able to walk and drive, uses a cane for long walks.  Chronic constipation managed with Miralax Chronic constipation effectively managed with daily Miralax, resulting in regular bowel movements without diarrhea. - Continue Miralax daily for constipation management  Plan - She is clinically doing well, lab reviewed, no concern for recurrence - Follow-up in 3 months with lab and pelvic MRI    SUMMARY OF ONCOLOGIC HISTORY: Oncology History  Rectal cancer (HCC)  04/25/2021 Initial Biopsy   Colon[place] Dr. Aundria in [place], [place] -Per the notes in epic, she was found to have an infiltrative, sessile, ulcerated nonobstructing mass in the distal rectum measuring 2 cm in length and diameter, 0 to 3 cm from the anal verge. 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   04/25/2021 Initial Diagnosis   Rectal cancer (HCC)   04/25/2021 Cancer Staging   Staging form: Colon and Rectum, AJCC 8th Edition - Clinical stage from 04/25/2021: Stage I (cT2, cN0, cM0) - Signed by Burton, Lacie K, NP on 06/19/2021 Stage prefix: Initial diagnosis Total positive nodes: 0   04/25/2021 Tumor Marker   Baseline CEA: 1.8 (normal)   04/28/2021 Imaging   Local staging pelvic MRI: IMPRESSION: TUMOR DESCRIPTION Circumferential Extent: 100 degrees from 9 o'clock to 5 o'clock along the posterior RIGHT border of the rectum.   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  Lesion at anorectal junction.     05/09/2021 Imaging   Staging CT chest/abdomen  IMPRESSION: 1. No evidence of metastatic disease within the chest or abdomen. 2. Hepatic steatosis. 3. Punctate nonobstructive left renal stone. 4. Bilateral renal cysts, including a hemorrhagic/proteinaceous right upper pole renal cyst. 5.  Aortic Atherosclerosis (ICD10-I70.0).   05/26/2021 Surgery   Transanal excision of distal rectal cancer by Dr. Lonni Pizza   05/26/2021 Pathology Results   A. RECTUM, RIGHT ANTERIOR LATERAL DISTAL, RESECTION:  - Invasive moderately differentiated adenocarcinoma, 2.5 cm  - Carcinoma focally invades into superficial muscularis propria  - Resection margins are negative for carcinoma  - Lateral mucosal margins are negative for dysplasia  - No evidence of lymphovascular invasion  - See oncology table  pT2, PN X IHC EXPRESSION RESULTS  TEST           RESULT  MLH1:          Preserved nuclear expression  MSH2:          Preserved nuclear expression  MSH6:          Preserved nuclear expression  PMS2:          Preserved nuclear expression     05/26/2021 Cancer Staging   Staging form: Colon and Rectum, AJCC 8th Edition - Pathologic stage from 05/26/2021: Stage Unknown (pT2, pNX, cM0) - Signed by Burton, Lacie K, NP on 06/19/2021    Genetic Testing   Negative genetic testing. No pathogenic variants identified on the [employer] CustomNext+RNA panel. The report date is 07/14/2021.  The CustomNext-Cancer+RNAinsight panel offered by [employer] includes sequencing and rearrangement analysis for the following 47 genes:  APC, ATM, AXIN2, BARD1, BMPR1A, BRCA1, BRCA2, BRIP1, CDH1, CDK4, CDKN2A, CHEK2, DICER1, EPCAM, GREM1, HOXB13, MEN1, MLH1, MSH2, MSH3, MSH6, MUTYH, NBN, NF1, NF2, NTHL1, PALB2, PMS2, POLD1, POLE, PTEN, RAD51C, RAD51D, RECQL, RET, SDHA, SDHAF2, SDHB, SDHC, SDHD, SMAD4, SMARCA4, STK11, TP53, TSC1, TSC2, and VHL.  RNA data is routinely analyzed for use in variant interpretation for all genes.   05/2022 Imaging   MRI of the pelvis without contrast for  rectal cancer surveillance IMPRESSION: No evidence of residual or recurrent primary lesion. No pelvic adenopathy.     03/07/2023 Procedure   Screening colonoscopy  1.  Nonbleeding, internal hemorrhoids 2.  Diverticulosis 3.  Otherwise normal exam.      Discussed the use of AI scribe software for clinical note transcription with the patient, who gave verbal consent to proceed.  History of Present Illness Cynthia Bryan is a 78 year old female with breast cancer who presents for follow-up.  She is doing well with no new issues. Appetite is good, and bowel movements are regular with daily Miralax. No diarrhea or bleeding.  Two and a half years post breast cancer diagnosis, she completed surgery, chemotherapy, and radiation therapy. Her last colonoscopy and pelvic scan were normal. She has not had a whole-body scan; her last CT scan was in 2023.  She experienced significant back pain last year, attributed to radiation effects post-hysterectomy. This pain required epidural injections and physical therapy, allowing her to walk and drive again. She uses a cane for long walks.  She has not seen Dr. Pizza recently, with the last visit in March of last year, where she had issues with scar tissue from radiation, necessitating a sedated sigmoidoscopy.     All other systems were reviewed with the patient and are negative.  MEDICAL HISTORY:  Past Medical History:  Diagnosis Date  Arthritis    back knees, shoulders   Basal cell carcinoma    right temple Dr. Therisa Molly removed 01/2017    Chicken pox    as child   Chronic back pain    COVID-19 10/13/2019   scratchy throat chills fever x 6 weeks x 6 weeks all symptoms resolved   Family history of bladder cancer    Family history of breast cancer    Family history of lung cancer    Family history of stomach cancer    Fatty liver    09/19/15   Gastric ulcer    neg H pylori  diet controlled   GERD (gastroesophageal reflux disease)     Gout    Hiatal hernia    SMALL   History of kidney stones    Hyperlipidemia    Hypertension    Lumbar herniated disc    Rectal cancer (HCC)    Recurrent UTI    last uti 6 months ago per pt on 05-23-2021   Rosacea    Syncope none since 2018 per pt    per pt presyncope no LOC felt dizzy/vision distorted and couldnt speak EEG neg, CT head neg 07/31/16, f/u [facility] cardiology with US  carotid, echo but no Holter    Umbilical hernia    noted 09/19/15    UTI (urinary tract infection)    Vitamin B 12 deficiency    Vitamin D  deficiency    Wears glasses     SURGICAL HISTORY: Past Surgical History:  Procedure Laterality Date   BIOPSY  04/27/2022   Procedure: BIOPSY;  Surgeon: Teresa Lonni HERO, MD;  Location: [facility];  Service: General;;   BREAST BIOPSY Right 1980   cyst removed negative pathology    colonscopy  04/17/2021   [facility]   CYSTOSCOPY W/ RETROGRADES Left 02/18/2020   Procedure: CYSTOSCOPY WITH RETROGRADE PYELOGRAM;  Surgeon: Kassie Ozell SAUNDERS, MD;  Location: [facility];  Service: Urology;  Laterality: Left;   EXTRACORPOREAL SHOCK WAVE LITHOTRIPSY Left 09/19/2017   Procedure: EXTRACORPOREAL SHOCK WAVE LITHOTRIPSY (ESWL);  Surgeon: Kassie Ozell SAUNDERS, MD;  Location: [facility];  Service: Urology;  Laterality: Left;   FLEXIBLE SIGMOIDOSCOPY N/A 04/27/2022   Procedure: DIAGNOSTIC FLEXIBLE SIGMOIDOSCOPY, ANORECTAL EXAM UNDER ANESTHESIA, POSSIBLE RECTAL BIOPSY;  Surgeon: Teresa Lonni HERO, MD;  Location: [facility];  Service: General;  Laterality: N/A;   JOINT REPLACEMENT     right knee 11/2015 Dr. Elgin [facility] [place]   RECTAL EXAM UNDER ANESTHESIA N/A 05/26/2021   Procedure: ANORECTAL EXAM UNDER ANESTHESIA;  Surgeon: Teresa Lonni HERO, MD;  Location: [facility] [place];  Service: General;  Laterality: N/A;   TRANSANAL EXCISION OF RECTAL MASS N/A 05/26/2021   Procedure: TRANSANAL EXCISION OF RIGHT ANTERIOR LATERAL DISTAL RECTAL CANCER;  Surgeon: Teresa Lonni HERO,  MD;  Location: [facility] [place];  Service: General;  Laterality: N/A;    I have reviewed the social history and family history with the patient and they are unchanged from previous note.  ALLERGIES:  is allergic to nortriptyline and sulfa antibiotics.  MEDICATIONS:  Current Outpatient Medications  Medication Sig Dispense Refill   acetaminophen  (TYLENOL ) 650 MG CR tablet Take 1,300 mg by mouth 2 (two) times daily.     allopurinol  (ZYLOPRIM ) 300 MG tablet Take 1 tablet (300 mg total) by mouth daily. 90 tablet 3   amLODipine  (NORVASC ) 2.5 MG tablet Take 1-2 tablets (2.5-5 mg total) by mouth daily. (Patient taking differently: Take 5 mg by mouth daily.)  180 tablet 3   atorvastatin  (LIPITOR) 40 MG tablet Take 1 tablet (40 mg total) by mouth daily at 6 PM. 90 tablet 3   Cyanocobalamin (B-12) 5000 MCG CAPS Take 5,000 mcg by mouth daily.      estradiol  (ESTRACE ) 0.1 MG/GM vaginal cream Estrogen Cream Instruction Discard applicator Apply pea sized amount to tip of finger to urethra before bed. Wash hands well after application. Use Monday, Wednesday and Friday 42.5 g 12   ezetimibe  (ZETIA ) 10 MG tablet Take 1 tablet (10 mg total) by mouth at bedtime. 90 tablet 3   gabapentin  (NEURONTIN ) 300 MG capsule Take 1 capsule (300 mg total) by mouth at bedtime. 90 capsule 3   Magnesium 500 MG CAPS Take 500 mg by mouth at bedtime.      omeprazole  (PRILOSEC) 20 MG capsule Take 1 capsule (20 mg total) by mouth daily. 30 min before food (Patient taking differently: Take 20 mg by mouth every evening. 30 min before food) 90 capsule 3   potassium chloride  (MICRO-K ) 10 MEQ CR capsule Take 3 capsules (30 mEq total) by mouth every morning. (Patient taking differently: Take 20 mEq by mouth every morning.) 360 capsule 3   traMADol  (ULTRAM ) 50 MG tablet Takes 1 tab in am , 2 at hs 170 tablet 5   triamterene -hydrochlorothiazide  (MAXZIDE -25) 37.5-25 MG tablet Take 1 tablet by mouth every morning. 90 tablet 3   No  current facility-administered medications for this visit.    PHYSICAL EXAMINATION: ECOG PERFORMANCE STATUS: 0 - Asymptomatic  Vitals:   10/01/23 1305  BP: (!) 158/60  Pulse: 80  Resp: 19  Temp: 97.6 F (36.4 C)  SpO2: 100%   Wt Readings from Last 3 Encounters:  10/01/23 216 lb 14.4 oz (98.4 kg)  04/01/23 208 lb 14.4 oz (94.8 kg)  04/20/22 200 lb (90.7 kg)     GENERAL:alert, no distress and comfortable SKIN: skin color, texture, turgor are normal, no rashes or significant lesions EYES: normal, Conjunctiva are pink and non-injected, sclera clear NECK: supple, thyroid  normal size, non-tender, without nodularity LYMPH:  no palpable lymphadenopathy in the cervical, axillary  LUNGS: clear to auscultation and percussion with normal breathing effort HEART: regular rate & rhythm and no murmurs and no lower extremity edema ABDOMEN:abdomen soft, non-tender and normal bowel sounds Musculoskeletal:no cyanosis of digits and no clubbing  NEURO: alert & oriented x 3 with fluent speech, no focal motor/sensory deficits  Physical Exam   LABORATORY DATA:  I have reviewed the data as listed    Latest Ref Rng & Units 10/01/2023   12:22 PM 04/01/2023    9:39 AM 11/27/2021    2:38 PM  CBC  WBC 4.0 - 10.5 K/uL 6.5  5.8  8.1   Hemoglobin 12.0 - 15.0 g/dL 86.5  86.4  87.3   Hematocrit 36.0 - 46.0 % 41.1  41.5  37.3   Platelets 150 - 400 K/uL 231  211  232         Latest Ref Rng & Units 10/01/2023   12:22 PM 04/01/2023    9:39 AM 11/27/2021    2:38 PM  CMP  Glucose 70 - 99 mg/dL 890  96  880   BUN 8 - 23 mg/dL 19  21  20    Creatinine 0.44 - 1.00 mg/dL 9.06  9.15  8.99   Sodium 135 - 145 mmol/L 139  138  139   Potassium 3.5 - 5.1 mmol/L 4.0  4.0  3.5   Chloride 98 -  111 mmol/L 100  101  101   CO2 22 - 32 mmol/L 30  29  27    Calcium  8.9 - 10.3 mg/dL 9.6  89.9  9.4   Total Protein 6.5 - 8.1 g/dL 7.0  6.8  7.0   Total Bilirubin 0.0 - 1.2 mg/dL 0.8  0.8  0.7   Alkaline Phos 38 - 126 U/L  104  101  80   AST 15 - 41 U/L 10  10  13    ALT 0 - 44 U/L 12  18  15        RADIOGRAPHIC STUDIES: I have personally reviewed the radiological images as listed and agreed with the findings in the report. No results found.    Orders Placed This Encounter  Procedures   MR PELVIS WO CM RECTAL CA STAGING    Standing Status:   Future    Expected Date:   12/25/2023    Expiration Date:   09/30/2024    If indicated for the ordered procedure, I authorize the administration of contrast media per Radiology protocol:   Yes    What is the patient's sedation requirement?:   No Sedation    Does the patient have a pacemaker or implanted devices?:   No    Preferred imaging location?:   [facility] (table limit - 500lbs)   All questions were answered. The patient knows to call the clinic with any problems, questions or concerns. No barriers to learning was detected. The total time spent in the appointment was 25 minutes, including review of chart and various tests results, discussions about plan of care and coordination of care plan     Onita Mattock, MD 10/01/2023

## 2023-10-01 NOTE — Assessment & Plan Note (Signed)
 Adenocarcinoma of the distal rectum, G2, pT2, cN0M0 stage IA, MMR normal -positive routine Cologuard with PCP, she was asymptomatic. Colonoscopy on 04/17/21 showed 2 cm distal rectal mass that was palpable on DRE. Path confirmed invasive moderately differentiated adenocarcinoma. -local staging MRI on 04/28/21 showed cT2N0 disease without lymphovascular invasion -staging CT on 05/09/21 was negative for distant metastasis -upfront transanal excision on 05/26/21 with Dr. Teresa showed 2.5 cm tumor, negative margins, MMR normal -due to moderate risk of recurrence, she received adjuvant concurrent chemoRT with Xeloda  on 07/03/21 - 08/15/21. She developed a rash to the back of her hands and chest, as well as diarrhea, otherwise tolerated well. -post-treatment anoscope by Dr. Teresa on 10/18/21 was benign. -post-treatment pelvic MRI on 11/27/21 showed NED -continue cancer surveillance

## 2023-11-19 ENCOUNTER — Other Ambulatory Visit: Payer: Self-pay | Admitting: Cardiology

## 2023-11-19 DIAGNOSIS — R0609 Other forms of dyspnea: Secondary | ICD-10-CM

## 2023-11-19 DIAGNOSIS — I10 Essential (primary) hypertension: Secondary | ICD-10-CM

## 2023-11-19 DIAGNOSIS — I429 Cardiomyopathy, unspecified: Secondary | ICD-10-CM

## 2023-11-19 DIAGNOSIS — Z8249 Family history of ischemic heart disease and other diseases of the circulatory system: Secondary | ICD-10-CM

## 2023-11-19 DIAGNOSIS — E785 Hyperlipidemia, unspecified: Secondary | ICD-10-CM

## 2023-11-19 DIAGNOSIS — Z87891 Personal history of nicotine dependence: Secondary | ICD-10-CM

## 2023-12-03 ENCOUNTER — Encounter (HOSPITAL_COMMUNITY): Payer: Self-pay

## 2023-12-04 ENCOUNTER — Other Ambulatory Visit: Payer: Self-pay | Admitting: Cardiology

## 2023-12-04 DIAGNOSIS — R0609 Other forms of dyspnea: Secondary | ICD-10-CM

## 2023-12-04 NOTE — Progress Notes (Signed)
 Orders only for Cardiac PET stress test.   Danita Bloch, PA-C

## 2023-12-05 ENCOUNTER — Ambulatory Visit
Admission: RE | Admit: 2023-12-05 | Discharge: 2023-12-05 | Disposition: A | Discharge status: Home / Self Care | Source: Ambulatory Visit | Attending: Cardiology | Admitting: Cardiology

## 2023-12-05 DIAGNOSIS — I429 Cardiomyopathy, unspecified: Secondary | ICD-10-CM | POA: Insufficient documentation

## 2023-12-05 DIAGNOSIS — Z8249 Family history of ischemic heart disease and other diseases of the circulatory system: Secondary | ICD-10-CM | POA: Insufficient documentation

## 2023-12-05 DIAGNOSIS — E785 Hyperlipidemia, unspecified: Secondary | ICD-10-CM

## 2023-12-05 DIAGNOSIS — Z87891 Personal history of nicotine dependence: Secondary | ICD-10-CM | POA: Diagnosis not present

## 2023-12-05 DIAGNOSIS — I10 Essential (primary) hypertension: Secondary | ICD-10-CM | POA: Diagnosis present

## 2023-12-05 DIAGNOSIS — R0609 Other forms of dyspnea: Secondary | ICD-10-CM

## 2023-12-05 LAB — NM PET CT CARDIAC PERFUSION MULTI W/ABSOLUTE BLOODFLOW
MBFR: 1.86
Nuc Rest EF: 35 %
Nuc Stress EF: 37 %
Peak HR: 76 {beats}/min
Rest HR: 65 {beats}/min
Rest MBF: 0.7 ml/g/min
Rest Nuclear Isotope Dose: 24.3 mCi
SRS: 0
SSS: 0
ST Depression (mm): 0 mm
Stress MBF: 1.3 ml/g/min
Stress Nuclear Isotope Dose: 24.4 mCi
TID: 1.08

## 2023-12-05 MED ORDER — REGADENOSON 0.4 MG/5ML IV SOLN
0.4000 mg | Freq: Once | INTRAVENOUS | Status: AC
  Administered 2023-12-05: 0.4 mg via INTRAVENOUS
  Filled 2023-12-05: qty 5

## 2023-12-05 MED ORDER — RUBIDIUM RB82 GENERATOR (RUBYFILL)
25.0000 | PACK | Freq: Once | INTRAVENOUS | Status: AC
  Administered 2023-12-05: 24.4 via INTRAVENOUS

## 2023-12-05 MED ORDER — RUBIDIUM RB82 GENERATOR (RUBYFILL)
25.0000 | PACK | Freq: Once | INTRAVENOUS | Status: AC
  Administered 2023-12-05: 24.33 via INTRAVENOUS

## 2023-12-05 MED ORDER — REGADENOSON 0.4 MG/5ML IV SOLN
INTRAVENOUS | Status: AC
  Filled 2023-12-05: qty 5

## 2023-12-16 ENCOUNTER — Ambulatory Visit
Admission: EM | Admit: 2023-12-16 | Discharge: 2023-12-16 | Disposition: A | Discharge status: Home / Self Care | Attending: Emergency Medicine | Admitting: Emergency Medicine

## 2023-12-16 DIAGNOSIS — N390 Urinary tract infection, site not specified: Secondary | ICD-10-CM | POA: Insufficient documentation

## 2023-12-16 DIAGNOSIS — J069 Acute upper respiratory infection, unspecified: Secondary | ICD-10-CM | POA: Insufficient documentation

## 2023-12-16 DIAGNOSIS — Z87891 Personal history of nicotine dependence: Secondary | ICD-10-CM | POA: Diagnosis not present

## 2023-12-16 DIAGNOSIS — I10 Essential (primary) hypertension: Secondary | ICD-10-CM | POA: Insufficient documentation

## 2023-12-16 DIAGNOSIS — K219 Gastro-esophageal reflux disease without esophagitis: Secondary | ICD-10-CM | POA: Insufficient documentation

## 2023-12-16 DIAGNOSIS — Z87440 Personal history of urinary (tract) infections: Secondary | ICD-10-CM | POA: Diagnosis not present

## 2023-12-16 DIAGNOSIS — E785 Hyperlipidemia, unspecified: Secondary | ICD-10-CM | POA: Diagnosis not present

## 2023-12-16 DIAGNOSIS — K449 Diaphragmatic hernia without obstruction or gangrene: Secondary | ICD-10-CM | POA: Diagnosis not present

## 2023-12-16 DIAGNOSIS — Z87442 Personal history of urinary calculi: Secondary | ICD-10-CM | POA: Diagnosis not present

## 2023-12-16 DIAGNOSIS — Z85828 Personal history of other malignant neoplasm of skin: Secondary | ICD-10-CM | POA: Insufficient documentation

## 2023-12-16 DIAGNOSIS — M19012 Primary osteoarthritis, left shoulder: Secondary | ICD-10-CM | POA: Diagnosis not present

## 2023-12-16 DIAGNOSIS — M479 Spondylosis, unspecified: Secondary | ICD-10-CM | POA: Insufficient documentation

## 2023-12-16 DIAGNOSIS — G8929 Other chronic pain: Secondary | ICD-10-CM | POA: Diagnosis not present

## 2023-12-16 DIAGNOSIS — Z85048 Personal history of other malignant neoplasm of rectum, rectosigmoid junction, and anus: Secondary | ICD-10-CM | POA: Insufficient documentation

## 2023-12-16 DIAGNOSIS — R059 Cough, unspecified: Secondary | ICD-10-CM | POA: Diagnosis present

## 2023-12-16 DIAGNOSIS — M19011 Primary osteoarthritis, right shoulder: Secondary | ICD-10-CM | POA: Insufficient documentation

## 2023-12-16 DIAGNOSIS — R300 Dysuria: Secondary | ICD-10-CM | POA: Diagnosis present

## 2023-12-16 DIAGNOSIS — M170 Bilateral primary osteoarthritis of knee: Secondary | ICD-10-CM | POA: Diagnosis not present

## 2023-12-16 LAB — URINALYSIS, W/ REFLEX TO CULTURE (INFECTION SUSPECTED)
Glucose, UA: NEGATIVE mg/dL
Hgb urine dipstick: NEGATIVE
Nitrite: POSITIVE — AB
Protein, ur: 30 mg/dL — AB
RBC / HPF: NONE SEEN RBC/hpf (ref 0–5)
Specific Gravity, Urine: 1.025 (ref 1.005–1.030)
pH: 5.5 (ref 5.0–8.0)

## 2023-12-16 MED ORDER — IPRATROPIUM BROMIDE 0.06 % NA SOLN: 2.0000 | Freq: Four times a day (QID) | NASAL | 12 refills | Status: DC

## 2023-12-16 MED ORDER — PHENAZOPYRIDINE HCL 200 MG PO TABS: 200.0000 mg | ORAL_TABLET | Freq: Three times a day (TID) | ORAL | 0 refills | Status: DC

## 2023-12-16 MED ORDER — BENZONATATE 100 MG PO CAPS: 200.0000 mg | ORAL_CAPSULE | Freq: Three times a day (TID) | ORAL | 0 refills | Status: DC

## 2023-12-16 MED ORDER — PROMETHAZINE-DM 6.25-15 MG/5ML PO SYRP: 5.0000 mL | ORAL_SOLUTION | Freq: Four times a day (QID) | ORAL | 0 refills | Status: DC | PRN

## 2023-12-16 MED ORDER — CEFDINIR 300 MG PO CAPS: 300.0000 mg | ORAL_CAPSULE | Freq: Two times a day (BID) | ORAL | 0 refills | Status: AC

## 2023-12-16 NOTE — ED Provider Notes (Signed)
 [facility]    CSN: [id number] Arrival date & time: 12/16/23  1008      History   Chief Complaint Chief Complaint  Patient presents with   Cough   Fever   Dysuria    HPI Cynthia Bryan is a 78 y.o. female.   HPI  78 year old female with past medical history significant for hypertension, arthritis, GERD, recurrent UTIs, hyperlipidemia, renal stones, basal cell carcinoma, hiatal hernia, rectal cancer, and chronic back pain presents for evaluation of respiratory and genitourinary complaints that been going on for last week.  She endorses runny nose, nasal congestion, sore throat, and nonproductive cough for the last week.  Some intermittent shortness of breath and wheezing as well.  Also some diarrhea.  She also endorses painful urination with urinary urgency and frequency for the past week.  2 days ago she developed a fever with a Tmax of 100.  Past Medical History:  Diagnosis Date   Arthritis    back knees, shoulders   Basal cell carcinoma    right temple Dr. Therisa Molly removed 01/2017    Chicken pox    as child   Chronic back pain    COVID-19 10/13/2019   scratchy throat chills fever x 6 weeks x 6 weeks all symptoms resolved   Family history of bladder cancer    Family history of breast cancer    Family history of lung cancer    Family history of stomach cancer    Fatty liver    09/19/15   Gastric ulcer    neg H pylori  diet controlled   GERD (gastroesophageal reflux disease)    Gout    Hiatal hernia    SMALL   History of kidney stones    Hyperlipidemia    Hypertension    Lumbar herniated disc    Rectal cancer (HCC)    Recurrent UTI    last uti 6 months ago per pt on 05-23-2021   Rosacea    Syncope none since 2018 per pt    per pt presyncope no LOC felt dizzy/vision distorted and couldnt speak EEG neg, CT head neg 07/31/16, f/u [facility]ology with US  carotid, echo but no Holter    Umbilical hernia    noted 09/19/15    UTI (urinary tract infection)     Vitamin B 12 deficiency    Vitamin D  deficiency    Wears glasses     Patient Active Problem List   Diagnosis Date Noted   Thickened endometrium 12/05/2021   Genetic testing 08/14/2021   Family history of breast cancer 07/03/2021   Family history of bladder cancer 07/03/2021   Family history of stomach cancer 07/03/2021   Family history of lung cancer 07/03/2021   Family history of cancer 06/14/2021   Rectal cancer (HCC) 04/25/2021   Goals of care, counseling/discussion 04/25/2021   Arthritis of knee 07/20/2020   Chronic right shoulder pain 07/20/2020   Pyelonephritis 02/17/2020   Kidney cysts 02/17/2020   Aortic atherosclerosis 02/17/2020   COVID-19 virus infection 10/26/2019   DOE (dyspnea on exertion) 10/26/2019   Weakness 10/26/2019   Osteopenia 09/16/2019   Prediabetes 09/16/2019   Abnormal MRI, lumbar spine 01/29/2018   Lumbar radiculopathy 01/29/2018   DDD (degenerative disc disease), lumbar 01/29/2018   History of kidney stones 12/31/2017   Bilateral hip pain 12/31/2017   Lumbar herniated disc 12/31/2017   History of skin cancer 09/03/2017   Bilateral low back pain with sciatica 09/03/2017   Dysuria  06/26/2017   Tick bite of right upper arm 06/26/2017   HTN (hypertension) 03/10/2017   Gout 03/10/2017   Recurrent UTI 03/10/2017   Fatty liver 03/10/2017   Basal cell carcinoma 03/10/2017   Factor V Leiden 03/10/2017   Left knee pain 03/10/2017   Gastroesophageal reflux disease 03/10/2017   Hyperlipidemia 03/10/2017    Past Surgical History:  Procedure Laterality Date   BIOPSY  04/27/2022   Procedure: BIOPSY;  Surgeon: Teresa Lonni HERO, MD;  Location: [facility];  Service: General;;   BREAST BIOPSY Right 1980   cyst removed negative pathology    colonscopy  04/17/2021   [facility]   CYSTOSCOPY W/ RETROGRADES Left 02/18/2020   Procedure: CYSTOSCOPY WITH RETROGRADE PYELOGRAM;  Surgeon: Kassie Ozell SAUNDERS, MD;  Location: [facility];  Service: Urology;   Laterality: Left;   EXTRACORPOREAL SHOCK WAVE LITHOTRIPSY Left 09/19/2017   Procedure: EXTRACORPOREAL SHOCK WAVE LITHOTRIPSY (ESWL);  Surgeon: Kassie Ozell SAUNDERS, MD;  Location: [facility];  Service: Urology;  Laterality: Left;   FLEXIBLE SIGMOIDOSCOPY N/A 04/27/2022   Procedure: DIAGNOSTIC FLEXIBLE SIGMOIDOSCOPY, ANORECTAL EXAM UNDER ANESTHESIA, POSSIBLE RECTAL BIOPSY;  Surgeon: Teresa Lonni HERO, MD;  Location: [facility];  Service: General;  Laterality: N/A;   JOINT REPLACEMENT     right knee 11/2015 Dr. Elgin [facility] [place]   RECTAL EXAM UNDER ANESTHESIA N/A 05/26/2021   Procedure: ANORECTAL EXAM UNDER ANESTHESIA;  Surgeon: Teresa Lonni HERO, MD;  Location: [facility] [place];  Service: General;  Laterality: N/A;   TRANSANAL EXCISION OF RECTAL MASS N/A 05/26/2021   Procedure: TRANSANAL EXCISION OF RIGHT ANTERIOR LATERAL DISTAL RECTAL CANCER;  Surgeon: Teresa Lonni HERO, MD;  Location: [facility];  Service: General;  Laterality: N/A;    OB History   No obstetric history on file.      Home Medications    Prior to Admission medications   Medication Sig Start Date End Date Taking? Authorizing Provider  benzonatate (TESSALON) 100 MG capsule Take 2 capsules (200 mg total) by mouth every 8 (eight) hours. 12/16/23  Yes Bernardino Ditch, NP  cefdinir (OMNICEF) 300 MG capsule Take 1 capsule (300 mg total) by mouth 2 (two) times daily for 7 days. 12/16/23 12/23/23 Yes Bernardino Ditch, NP  hydrocortisone  2.5 % cream Apply 1 Application topically 2 (two) times daily as needed. 12/12/23  Yes [provider]  ipratropium (ATROVENT) 0.06 % nasal spray Place 2 sprays into both nostrils 4 (four) times daily. 12/16/23  Yes Bernardino Ditch, NP  losartan (COZAAR) 25 MG tablet Take 25 mg by mouth daily. 11/15/23 11/14/24 Yes [provider]  metoprolol succinate (TOPROL-XL) 50 MG 24 hr tablet Take 50 mg by mouth daily. 11/15/23 11/14/24 Yes [provider]   phenazopyridine (PYRIDIUM) 200 MG tablet Take 1 tablet (200 mg total) by mouth 3 (three) times daily. 12/16/23  Yes Bernardino Ditch, NP  promethazine -dextromethorphan (PROMETHAZINE -DM) 6.25-15 MG/5ML syrup Take 5 mLs by mouth 4 (four) times daily as needed. 12/16/23  Yes Bernardino Ditch, NP  acetaminophen  (TYLENOL ) 650 MG CR tablet Take 1,300 mg by mouth 2 (two) times daily.    [provider]  allopurinol  (ZYLOPRIM ) 300 MG tablet Take 1 tablet (300 mg total) by mouth daily. 01/31/21   McLean-Scocuzza, Randine SAILOR, MD  amLODipine  (NORVASC ) 2.5 MG tablet Take 1-2 tablets (2.5-5 mg total) by mouth daily. Patient taking differently: Take 5 mg by mouth daily. 08/01/21   McLean-Scocuzza, Randine SAILOR, MD  atorvastatin  (LIPITOR) 40 MG tablet Take 1  tablet (40 mg total) by mouth daily at 6 PM. 08/01/21   McLean-Scocuzza, Randine SAILOR, MD  Cyanocobalamin (B-12) 5000 MCG CAPS Take 5,000 mcg by mouth daily.     [provider]  estradiol  (ESTRACE ) 0.1 MG/GM vaginal cream Estrogen Cream Instruction Discard applicator Apply pea sized amount to tip of finger to urethra before bed. Wash hands well after application. Use Monday, Wednesday and Friday 11/15/21   Penne Knee, MD  ezetimibe  (ZETIA ) 10 MG tablet Take 1 tablet (10 mg total) by mouth at bedtime. 08/01/21   McLean-Scocuzza, Randine SAILOR, MD  gabapentin  (NEURONTIN ) 300 MG capsule Take 1 capsule (300 mg total) by mouth at bedtime. 08/01/21   McLean-Scocuzza, Randine SAILOR, MD  KLOR-CON  M10 10 MEQ tablet Take 10 mEq by mouth 2 (two) times daily.    [provider]  Magnesium 500 MG CAPS Take 500 mg by mouth at bedtime.     [provider]  omeprazole  (PRILOSEC) 20 MG capsule Take 1 capsule (20 mg total) by mouth daily. 30 min before food Patient taking differently: Take 20 mg by mouth every evening. 30 min before food 08/01/21   McLean-Scocuzza, Randine SAILOR, MD  potassium chloride  (MICRO-K ) 10 MEQ CR capsule Take 3 capsules (30 mEq total) by mouth every  morning. Patient taking differently: Take 20 mEq by mouth every morning. 08/01/21   McLean-Scocuzza, Randine SAILOR, MD  traMADol  (ULTRAM ) 50 MG tablet Takes 1 tab in am , 2 at hs 08/01/21   McLean-Scocuzza, Randine SAILOR, MD  triamterene -hydrochlorothiazide  (MAXZIDE -25) 37.5-25 MG tablet Take 1 tablet by mouth every morning. 08/01/21   McLean-Scocuzza, Randine SAILOR, MD    Family History Family History  Problem Relation Age of Onset   Stroke Mother    Breast cancer Mother 19   Cancer Mother        breast cancer s/p masectomy lived to be 8    Hypertension Mother    Cancer Father        NMSC skin cancer    Gout Father    Heart disease Father        s/p bypass surgery age 96 lived 5 months after surgery    Kidney disease Father        RAS>kidney failure    Bladder Cancer Brother    Hyperlipidemia Paternal Aunt    Stroke Paternal Aunt    Breast cancer Paternal Aunt    Rectal cancer Paternal Aunt    Kidney cancer Neg Hx    Prolactinoma Neg Hx     Social History Social History   Tobacco Use   Smoking status: Former    Current packs/day: 0.00    Average packs/day: 1 pack/day for 25.0 years (25.0 ttl pk-yrs)    Types: Cigarettes    Start date: 02/08/1969    Quit date: 02/08/1994    Years since quitting: 29.8   Smokeless tobacco: Never   Tobacco comments:    smoked total 30 years quit 20 years ago from 2019 max 1 ppd no FH lung cancer   Vaping Use   Vaping status: Never Used  Substance Use Topics   Alcohol use: No   Drug use: Never     Allergies   Nortriptyline and Sulfa antibiotics   Review of Systems Review of Systems  Constitutional:  Positive for fever.  HENT:  Positive for congestion, rhinorrhea and sore throat. Negative for ear pain.   Respiratory:  Positive for cough, shortness of breath and wheezing.   Gastrointestinal:  Positive  for diarrhea. Negative for abdominal pain, nausea and vomiting.  Genitourinary:  Positive for dysuria, frequency and urgency. Negative for hematuria.   Musculoskeletal:  Negative for back pain.     Physical Exam Triage Vital Signs ED Triage Vitals  Encounter Vitals Group     BP 12/16/23 1153 (!) 152/64     Girls Systolic BP Percentile --      Girls Diastolic BP Percentile --      Boys Systolic BP Percentile --      Boys Diastolic BP Percentile --      Pulse Rate 12/16/23 1153 70     Resp 12/16/23 1153 16     Temp 12/16/23 1153 98.4 F (36.9 C)     Temp Source 12/16/23 1153 Oral     SpO2 12/16/23 1153 99 %     Weight 12/16/23 1152 200 lb (90.7 kg)     Height 12/16/23 1152 5' 6 (1.676 m)     Head Circumference --      Peak Flow --      Pain Score 12/16/23 1156 0     Pain Loc --      Pain Education --      Exclude from Growth Chart --    No data found.  Updated Vital Signs BP (!) 152/64 (BP Location: Right Arm)   Pulse 70   Temp 98.4 F (36.9 C) (Oral)   Resp 16   Ht 5' 6 (1.676 m)   Wt 200 lb (90.7 kg)   SpO2 99%   BMI 32.28 kg/m   Visual Acuity Right Eye Distance:   Left Eye Distance:   Bilateral Distance:    Right Eye Near:   Left Eye Near:    Bilateral Near:     Physical Exam Vitals and nursing note reviewed.  Constitutional:      Appearance: Normal appearance. She is not ill-appearing.  HENT:     Head: Normocephalic and atraumatic.     Right Ear: Tympanic membrane, ear canal and external ear normal. There is no impacted cerumen.     Left Ear: Tympanic membrane, ear canal and external ear normal. There is no impacted cerumen.     Nose: Congestion and rhinorrhea present.     Comments: Nasal mucosa is erythematous and edematous with yellow discharge in both nares.    Mouth/Throat:     Mouth: Mucous membranes are moist.     Pharynx: Oropharynx is clear. Posterior oropharyngeal erythema present. No oropharyngeal exudate.     Comments: Erythema to the posterior oropharynx with yellow postnasal drip. Cardiovascular:     Rate and Rhythm: Normal rate and regular rhythm.     Pulses: Normal pulses.      Heart sounds: Normal heart sounds. No murmur heard.    No friction rub. No gallop.  Pulmonary:     Effort: Pulmonary effort is normal. No respiratory distress.     Breath sounds: Wheezing present. No rhonchi or rales.     Comments: Scattered expiratory wheezing. Musculoskeletal:     Cervical back: Normal range of motion and neck supple. No tenderness.  Lymphadenopathy:     Cervical: No cervical adenopathy.  Skin:    General: Skin is warm and dry.     Capillary Refill: Capillary refill takes less than 2 seconds.     Findings: No rash.  Neurological:     General: No focal deficit present.     Mental Status: She is alert and oriented to person, place, and time.  UC Treatments / Results  Labs (all labs ordered are listed, but only abnormal results are displayed) Labs Reviewed  URINALYSIS, W/ REFLEX TO CULTURE (INFECTION SUSPECTED) - Abnormal; Notable for the following components:      Result Value   Color, Urine AMBER (*)    Bilirubin Urine SMALL (*)    Ketones, ur TRACE (*)    Protein, ur 30 (*)    Nitrite POSITIVE (*)    Leukocytes,Ua TRACE (*)    Bacteria, UA MANY (*)    All other components within normal limits  URINE CULTURE    EKG   Radiology No results found.  Procedures Procedures (including critical care time)  Medications Ordered in UC Medications - No data to display  Initial Impression / Assessment and Plan / UC Course  I have reviewed the triage vital signs and the nursing notes.  Pertinent labs & imaging results that were available during my care of the patient were reviewed by me and considered in my medical decision making (see chart for details).   Patient is a nontoxic-appearing 78 year old female presenting for evaluation of respiratory and genitourinary complaints as outlined in HPI above.  The patient describes her cough as being a croupy cough and nonproductive with some shortness of breath and wheezing.  The patient has cough in the exam  room and it does not sound like croup.  She is able to speak in full sentence without dyspnea or tachypnea.  Her physical exam does reveal inflammation of her upper respiratory tract.  Lungs sounds have scattered expiratory wheezing but no rales or rhonchi.  The patient exam is consistent with an upper respiratory tract infection and I believe that her postnasal drip is what is triggering her cough.  She is also experiencing UTI symptoms but has no CVA tenderness.  I will order urinalysis to assess for the presence of UTI.  Urinalysis has an Electrical engineer with a clear appearance, small bilirubin, trace ketones, 30 protein, nitrate positive with trace leukocyte esterase.  Reflex microscopy shows 21-50 WBCs, many bacteria, and WBC clumps.  Urine will reflex to culture.  I will discharge patient on the diagnosis of URI with cough and congestion and urinary tract infection.  I will treat both with cefdinir 300 mg twice daily for 7 days.  I will also prescribe Atrovent nasal spray for her congestion and Tessalon Perles and Promethazine  DM cough syrup for cough and congestion.  For her urinary discomfort I will prescribe Pyridium that she can take every 8 hours.  She may continue using over-the-counter Tylenol  and/or ibuprofen as needed for any fever or pain.  Return and ER precautions reviewed.   Final Clinical Impressions(s) / UC Diagnoses   Final diagnoses:  URI with cough and congestion  Lower urinary tract infectious disease     Discharge Instructions      Take the cefdinir twice daily for 7 days with food for treatment of urinary tract infection and your URI.  Use the Pyridium every 8 hours as needed for urinary discomfort.  This will turn your urine a bright red-orange.  Increase your oral fluid intake so that you increase your urine production and or flushing your urinary system.  Take an over-the-counter probiotic, such as Culturelle-Align-Activia, 1 hour after each dose of antibiotic to  prevent diarrhea or yeast infections from forming.  We will culture urine and change the antibiotics if necessary.  Use the Atrovent nasal spray, 2 squirts in each nostril every 6 hours, as needed  for runny nose and postnasal drip.  Use the Tessalon Perles every 8 hours during the day.  Take them with a small sip of water.  They may give you some numbness to the base of your tongue or a metallic taste in your mouth, this is normal.  Use the Promethazine  DM cough syrup at bedtime for cough and congestion.  It will make you drowsy so do not take it during the day.  Return for reevaluation or see your primary care provider for any new or worsening symptoms.      ED Prescriptions     Medication Sig Dispense Auth. Provider   benzonatate (TESSALON) 100 MG capsule Take 2 capsules (200 mg total) by mouth every 8 (eight) hours. 21 capsule Bernardino Ditch, NP   cefdinir (OMNICEF) 300 MG capsule Take 1 capsule (300 mg total) by mouth 2 (two) times daily for 7 days. 14 capsule Bernardino Ditch, NP   ipratropium (ATROVENT) 0.06 % nasal spray Place 2 sprays into both nostrils 4 (four) times daily. 15 mL Bernardino Ditch, NP   promethazine -dextromethorphan (PROMETHAZINE -DM) 6.25-15 MG/5ML syrup Take 5 mLs by mouth 4 (four) times daily as needed. 118 mL Bernardino Ditch, NP   phenazopyridine (PYRIDIUM) 200 MG tablet Take 1 tablet (200 mg total) by mouth 3 (three) times daily. 6 tablet Bernardino Ditch, NP      PDMP not reviewed this encounter.   Bernardino Ditch, NP 12/16/23 1252

## 2023-12-16 NOTE — ED Triage Notes (Signed)
 Pt c/o cough x1 wk & fever x2 days. Tmax 100. Has tried OTC meds w/relief.   Also c/o urinary freq,burning & pain x1 wk. No OTC meds.

## 2023-12-16 NOTE — Discharge Instructions (Addendum)
 Take the cefdinir twice daily for 7 days with food for treatment of urinary tract infection and your URI.  Use the Pyridium every 8 hours as needed for urinary discomfort.  This will turn your urine a bright red-orange.  Increase your oral fluid intake so that you increase your urine production and or flushing your urinary system.  Take an over-the-counter probiotic, such as Culturelle-Align-Activia, 1 hour after each dose of antibiotic to prevent diarrhea or yeast infections from forming.  We will culture urine and change the antibiotics if necessary.  Use the Atrovent nasal spray, 2 squirts in each nostril every 6 hours, as needed for runny nose and postnasal drip.  Use the Tessalon Perles every 8 hours during the day.  Take them with a small sip of water.  They may give you some numbness to the base of your tongue or a metallic taste in your mouth, this is normal.  Use the Promethazine  DM cough syrup at bedtime for cough and congestion.  It will make you drowsy so do not take it during the day.  Return for reevaluation or see your primary care provider for any new or worsening symptoms.

## 2023-12-20 ENCOUNTER — Ambulatory Visit (HOSPITAL_COMMUNITY): Payer: Self-pay

## 2023-12-20 LAB — URINE CULTURE: Culture: >=100000 — AB

## 2023-12-23 NOTE — Progress Notes (Signed)
 Established patient Visit    Chief Complaint: Shortness of breath Date of Service: 12/23/2023 Date of Birth: 02/24/1946 PCP: Auston Reyes BIRCH, MD  History of Present Illness: [name] is a 78 y.o.female patient who presented for shortness of breath  Past medical history significant for cardiomyopathy, hypertension, hyperlipidemia, obesity, prior tobacco use-quit 1997, family history of CAD in father.  Echocardiogram 10/2023 with reduced LVEF of 40% with global hypokinesis.  Normal LV systolic function and no significant valvular abnormality.  PET/CT stress test 12/2023 with no evidence of ischemia or infarction, LVEF moderately reduced suggesting nonischemic cardiomyopathy.  Patient details that in last 2 to 3 weeks she has symptoms of chest congestion along with some orthopnea.  Slept in recliner for couple days which is now better. Has exertional dyspnea.  No chest pain/pressure, dizziness or syncope.  Past Medical and Surgical History  Past Medical History Past Medical History:  Diagnosis Date   Adnexal mass 02/05/2022   Allergic state Childhood   Sulfur Drug; Codeine medications   Barrett's esophagus    last EGD 2011, Dr. Gaylyn   Basal cell carcinoma    Dr. 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   Brother and nephew with history of DVT   Fracture of foot 2010   GERD (gastroesophageal reflux disease)    Gout    History of knee replacement 11/2015   right knee    Hyperlipidemia    Hypertension    Infertility management    Obesity    Osteoporosis    Post concussion syndrome    Prolonged Q-T interval on ECG 11/14/2015   Documented  initially 2007, again 2017   Rectal cancer (CMS/HHS-HCC) 02/16/2022   Vitamin D  deficiency 12/30/2013    Past Surgical History She has a past surgical history that includes Breast excisional biopsy (Right, 1980); Colonoscopy (04/17/2021); Transanal Excision of Right Anterior Lateral Distal Rectal Cancer (05/26/2021); hysteroscopy (N/A, 02/21/2022); dilation & currettage (N/A, 02/21/2022); pelvic examination under anesthesia (N/A, 02/21/2022); Breast surgery (Cyst removed from right breast); Hysteroscopy; Joint replacement (Right Knee); robot assisted laparoscopic surgery with total hysterectomy (Bilateral, 04/04/2022); pelvic examination under anesthesia (N/A, 04/04/2022); and Colon @ [facility] (02/22/2023).   Medications and Allergies  Current Medications Current Outpatient Medications  Medication Sig Dispense Refill   acetaminophen  (TYLENOL ) 500 MG tablet Take 1,000 mg by mouth every 8 (eight) hours as needed for Pain     allopurinoL  (ZYLOPRIM ) 300 MG tablet TAKE 1 TABLET ONCE DAILY 90 tablet 3   amoxicillin (AMOXIL) 500 MG tablet      atorvastatin  (LIPITOR) 20 MG tablet TAKE 1 TABLET ONCE DAILY 90 tablet 3   cholecalciferol (VITAMIN D3) 5,000 unit capsule Take 5,000 Units by mouth once daily     cyanocobalamin, vitamin B-12, 5,000 mcg Cap Take 5,000 mcg by mouth once daily     ezetimibe  (ZETIA ) 10 mg tablet TAKE 1 TABLET AT BEDTIME 90 tablet 3   gabapentin  (NEURONTIN ) 100 MG capsule 1 po qHS x 4 days, then bid x 4 days, then tid 90 capsule 3   garlic 1,000 mg Cap Take by mouth     KLOR-CON  M10 10 mEq ER tablet TAKE  1 TABLET TWICE A DAY 180 tablet 3   magnesium 250 mg Tab Take 500 mg by mouth at bedtime     metoprolol SUCCinate (TOPROL-XL) 50 MG XL tablet Take 1 tablet (50 mg total) by mouth once daily 30 tablet 11   omeprazole  (PRILOSEC) 20 MG DR capsule TAKE 1 CAPSULE ONCE DAILY 90 capsule 3   traMADoL  (ULTRAM ) 50 mg tablet 1 po bid prn 60 tablet 5   FUROsemide  (LASIX ) 20 MG  tablet Take 1 tablet (20 mg total) by mouth once daily 30 tablet 11   losartan (COZAAR) 50 MG tablet Take 1 tablet (50 mg total) by mouth once daily 30 tablet 11   No current facility-administered medications for this visit.    Allergies Nortriptyline and Sulfa (sulfonamide antibiotics)  Social and Family History  Social History  reports that she quit smoking about 28 years ago. Her smoking use included cigarettes. She started smoking about 57 years ago. She has a 28.5 pack-year smoking history. She has never used smokeless tobacco. She reports that she does not drink alcohol and does not use drugs.  Family History family history includes Breast cancer in her mother; Cancer in her brother and mother; Coronary Artery Disease (Blocked arteries around heart) in her brother; Coronary Artery Disease (Blocked arteries around heart) (age of onset: 7) in her father; Deep vein thrombosis (DVT or abnormal blood clot formation) in her brother; High blood pressure (Hypertension) in her brother, maternal aunt, maternal aunt, maternal uncle, maternal uncle, and mother; Hyperlipidemia (Elevated cholesterol) in her mother; Osteoarthritis in her father; Other in her brother; Other (age of onset: 39) in her brother; Skin cancer in her father; Stroke in her brother, maternal aunt, maternal aunt, maternal uncle, and maternal uncle; Stroke (age of onset: 65) in her mother.   Review of Systems   Review of Systems:  Exertional dyspnea  Physical Examination   Vitals:BP 126/74   Pulse 86   Ht 165.1 cm (5' 5)   Wt 93.9 kg (207 lb)   SpO2 98%   BMI 34.45 kg/m  Ht:165.1 cm (5' 5) Wt:93.9 kg (207 lb) ADJ:Anib surface area is 2.08 meters squared. Body mass index is 34.45 kg/m.  HEENT: Pupils equally reactive to light and accomodation  Neck: Supple, no significant JVD Lungs: clear to auscultation bilaterally; no wheezes, rales, rhonchi Heart: Regular rate and rhythm.Grade 2/6 systolic murmur  LLSB Extremities: no pedal edema  Assessment and Plan   78 y.o. female with  Heart failure with reduced EF Nonischemic cardiomyopathy, LVEF 40% PET/CT stress test 12/2023 with no ischemia, mild coronary calcification noted Hypertension Hyperlipidemia Frequent PACs History of tobacco use-quit 1997 RBBB Obesity Family history of CAD  Patient with symptoms of shortness of breath with some orthopnea.  Not fluid overloaded on exam.  Considering her symptoms, concern for heart failure. Will start Lasix  20 mg daily Check BNP and chest x-ray today. Will stop triamterene /hydrochlorothiazide  for further optimization of GDMT for heart failure. Will increase losartan to 50 mg daily, Entresto not affordable.  Continue Toprol-XL 50 mg daily BMP in 1 week Continue statin  Orders Placed This Encounter  Procedures   X-ray chest PA and lateral   B-type natriuretic peptide (BNP)   Basic Metabolic Panel (BMP)    Return in about 4 weeks (around 01/20/2024).  KRISHNA CHAITANYA ALLURI, MD  This dictation was prepared with dragon dictation. Any transcription errors that result from this process are unintentional.

## 2023-12-25 ENCOUNTER — Inpatient Hospital Stay: Attending: Hematology

## 2023-12-25 ENCOUNTER — Ambulatory Visit (HOSPITAL_COMMUNITY)
Admission: RE | Admit: 2023-12-25 | Discharge: 2023-12-25 | Disposition: A | Discharge status: Home / Self Care | Source: Ambulatory Visit | Attending: Hematology | Admitting: Hematology

## 2023-12-25 DIAGNOSIS — C20 Malignant neoplasm of rectum: Secondary | ICD-10-CM

## 2023-12-25 DIAGNOSIS — Z87891 Personal history of nicotine dependence: Secondary | ICD-10-CM | POA: Insufficient documentation

## 2023-12-25 DIAGNOSIS — Z85048 Personal history of other malignant neoplasm of rectum, rectosigmoid junction, and anus: Secondary | ICD-10-CM | POA: Diagnosis present

## 2023-12-25 LAB — CBC WITH DIFFERENTIAL/PLATELET
Abs Immature Granulocytes: 0.01 10*3/uL (ref 0.00–0.07)
Basophils Absolute: 0 10*3/uL (ref 0.0–0.1)
Basophils Relative: 0 %
Eosinophils Absolute: 0.2 10*3/uL (ref 0.0–0.5)
Eosinophils Relative: 2 %
HCT: 38.5 % (ref 36.0–46.0)
Hemoglobin: 12.5 g/dL (ref 12.0–15.0)
Immature Granulocytes: 0 %
Lymphocytes Relative: 19 %
Lymphs Abs: 1.3 10*3/uL (ref 0.7–4.0)
MCH: 30.6 pg (ref 26.0–34.0)
MCHC: 32.5 g/dL (ref 30.0–36.0)
MCV: 94.1 fL (ref 80.0–100.0)
Monocytes Absolute: 0.5 10*3/uL (ref 0.1–1.0)
Monocytes Relative: 7 %
Neutro Abs: 4.8 10*3/uL (ref 1.7–7.7)
Neutrophils Relative %: 72 %
Platelets: 208 10*3/uL (ref 150–400)
RBC: 4.09 MIL/uL (ref 3.87–5.11)
RDW: 16.2 % — ABNORMAL HIGH (ref 11.5–15.5)
WBC: 6.7 10*3/uL (ref 4.0–10.5)
nRBC: 0 % (ref 0.0–0.2)

## 2023-12-25 LAB — COMPREHENSIVE METABOLIC PANEL WITH GFR
ALT: 9 U/L (ref 0–44)
AST: 10 U/L — ABNORMAL LOW (ref 15–41)
Albumin: 4.1 g/dL (ref 3.5–5.0)
Alkaline Phosphatase: 98 U/L (ref 38–126)
Anion gap: 7 (ref 5–15)
BUN: 23 mg/dL (ref 8–23)
CO2: 31 mmol/L (ref 22–32)
Calcium: 10.1 mg/dL (ref 8.9–10.3)
Chloride: 104 mmol/L (ref 98–111)
Creatinine, Ser: 1.02 mg/dL — ABNORMAL HIGH (ref 0.44–1.00)
GFR, Estimated: 57 mL/min — ABNORMAL LOW
Glucose, Bld: 110 mg/dL — ABNORMAL HIGH (ref 70–99)
Potassium: 4.4 mmol/L (ref 3.5–5.1)
Sodium: 142 mmol/L (ref 135–145)
Total Bilirubin: 0.9 mg/dL (ref 0.0–1.2)
Total Protein: 6.6 g/dL (ref 6.5–8.1)

## 2023-12-25 LAB — CEA (ACCESS): CEA (CHCC): 1.22 ng/mL (ref 0.00–5.00)

## 2023-12-31 NOTE — Assessment & Plan Note (Signed)
 Adenocarcinoma of the distal rectum, G2, pT2, cN0M0 stage IA, MMR normal -positive routine Cologuard with PCP, she was asymptomatic. Colonoscopy on 04/17/21 showed 2 cm distal rectal mass that was palpable on DRE. Path confirmed invasive moderately differentiated adenocarcinoma. -local staging MRI on 04/28/21 showed cT2N0 disease without lymphovascular invasion -staging CT on 05/09/21 was negative for distant metastasis -upfront transanal excision on 05/26/21 with Dr. Teresa showed 2.5 cm tumor, negative margins, MMR normal -due to moderate risk of recurrence, she received adjuvant concurrent chemoRT with Xeloda  on 07/03/21 - 08/15/21. She developed a rash to the back of her hands and chest, as well as diarrhea, otherwise tolerated well. -post-treatment anoscope by Dr. Teresa on 10/18/21 was benign. -post-treatment pelvic MRI on 12/25/2023 showed NED -continue cancer surveillance

## 2024-01-01 ENCOUNTER — Inpatient Hospital Stay (HOSPITAL_BASED_OUTPATIENT_CLINIC_OR_DEPARTMENT_OTHER): Admitting: Hematology

## 2024-01-01 ENCOUNTER — Encounter: Payer: Self-pay | Admitting: Hematology

## 2024-01-01 VITALS — BP 148/66 | HR 78 | Temp 98.7°F | Resp 20 | Ht 66.0 in | Wt 211.1 lb

## 2024-01-01 DIAGNOSIS — C20 Malignant neoplasm of rectum: Secondary | ICD-10-CM | POA: Diagnosis not present

## 2024-01-01 DIAGNOSIS — Z85048 Personal history of other malignant neoplasm of rectum, rectosigmoid junction, and anus: Secondary | ICD-10-CM | POA: Diagnosis not present

## 2024-01-01 NOTE — Progress Notes (Signed)
 [facility]   [phone] [phone]   Clinic Follow up Note   Patient Care Team: Auston Reyes BIRCH, MD as PCP - General (Internal Medicine) Lanny Callander, MD as Consulting Physician (Hematology and Oncology) Hanford Powell BRAVO, NP as Nurse Practitioner (Hematology and Oncology)  Date of Service:  01/01/2024  CHIEF COMPLAINT: f/u of rectal cancer  CURRENT THERAPY:  Cancer surveillance  Oncology History   Rectal cancer [facility]) Adenocarcinoma of the distal rectum, G2, pT2, cN0M0 stage IA, MMR normal -positive routine Cologuard with PCP, she was asymptomatic. Colonoscopy on 04/17/21 showed 2 cm distal rectal mass that was palpable on DRE. Path confirmed invasive moderately differentiated adenocarcinoma. -local staging MRI on 04/28/21 showed cT2N0 disease without lymphovascular invasion -staging CT on 05/09/21 was negative for distant metastasis -upfront transanal excision on 05/26/21 with Dr. Teresa showed 2.5 cm tumor, negative margins, MMR normal -due to moderate risk of recurrence, she received adjuvant concurrent chemoRT with Xeloda  on 07/03/21 - 08/15/21. She developed a rash to the back of her hands and chest, as well as diarrhea, otherwise tolerated well. -post-treatment anoscope by Dr. Teresa on 10/18/21 was benign. -post-treatment pelvic MRI on 12/25/2023 showed NED -continue cancer surveillance   Assessment & Plan Rectal cancer, status post resection, in remission Rectal cancer diagnosed in February 2023, status post resection, currently in remission. No signs of cancer recurrence on recent PET scan and MRI.  I personally reviewed the images with the patient.  No bowel movement issues, diarrhea, constipation, or bleeding. Recent colonoscopy in December showed no abnormalities. PET scan primarily for cardiac evaluation but incidentally showed no concerning findings in the abdomen or pelvis. No evidence of [place] disease. Tumor markers and blood counts are  normal. She reports feeling well with no significant symptoms related to rectal cancer. - Continue annual colonoscopy. - Will schedule follow-up appointment in six months. - Will order pelvic MRI one week before the next appointment. - Will repeat labs including tumor markers and blood counts one week before the next appointment.  Plan - Reason the PET scan and pelvic MRI images reviewed with patient, no signs of cancer recurrence - Continue cancer surveillance.  She will have repeated colonoscopy in 2 to 3 months - Follow-up in 6 months with lab and pelvic MRI 1 week before  SUMMARY OF ONCOLOGIC HISTORY: Oncology History  Rectal cancer (HCC)  04/25/2021 Initial Biopsy   Colon[place] with Dr. Aundria in [place], [place] -Per the notes in epic, she was found to have an infiltrative, sessile, ulcerated nonobstructing mass in the distal rectum measuring 2 cm in length and diameter, 0 to 3 cm from the anal verge. 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   04/25/2021 Initial Diagnosis   Rectal cancer (HCC)   04/25/2021 Cancer Staging   Staging form: Colon and Rectum, AJCC 8th Edition - Clinical stage from 04/25/2021: Stage I (cT2, cN0, cM0) - Signed by Burton, Lacie K, NP on 06/19/2021 Stage prefix: Initial diagnosis Total positive nodes: 0   04/25/2021 Tumor Marker   Baseline CEA: 1.8 (normal)   04/28/2021 Imaging   Local staging pelvic MRI: IMPRESSION: TUMOR DESCRIPTION Circumferential Extent: 100 degrees from 9 o'clock to 5 o'clock along the posterior RIGHT border of the rectum.   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 Lesion at anorectal junction.     05/09/2021  Imaging   Staging CT chest/abdomen IMPRESSION: 1. No evidence of metastatic disease within the chest or abdomen. 2.  Hepatic steatosis. 3. Punctate nonobstructive left renal stone. 4. Bilateral renal cysts, including a hemorrhagic/proteinaceous right upper pole renal cyst. 5.  Aortic Atherosclerosis (ICD10-I70.0).   05/26/2021 Surgery   Transanal excision of distal rectal cancer by Dr. Lonni Pizza   05/26/2021 Pathology Results   A. RECTUM, RIGHT ANTERIOR LATERAL DISTAL, RESECTION:  - Invasive moderately differentiated adenocarcinoma, 2.5 cm  - Carcinoma focally invades into superficial muscularis propria  - Resection margins are negative for carcinoma  - Lateral mucosal margins are negative for dysplasia  - No evidence of lymphovascular invasion  - See oncology table  pT2, PN X IHC EXPRESSION RESULTS  TEST           RESULT  MLH1:          Preserved nuclear expression  MSH2:          Preserved nuclear expression  MSH6:          Preserved nuclear expression  PMS2:          Preserved nuclear expression     05/26/2021 Cancer Staging   Staging form: Colon and Rectum, AJCC 8th Edition - Pathologic stage from 05/26/2021: Stage Unknown (pT2, pNX, cM0) - Signed by Burton, Lacie K, NP on 06/19/2021    Genetic Testing   Negative genetic testing. No pathogenic variants identified on the [employer] CustomNext+RNA panel. The report date is 07/14/2021.  The CustomNext-Cancer+RNAinsight panel offered by [employer] includes sequencing and rearrangement analysis for the following 47 genes:  APC, ATM, AXIN2, BARD1, BMPR1A, BRCA1, BRCA2, BRIP1, CDH1, CDK4, CDKN2A, CHEK2, DICER1, EPCAM, GREM1, HOXB13, MEN1, MLH1, MSH2, MSH3, MSH6, MUTYH, NBN, NF1, NF2, NTHL1, PALB2, PMS2, POLD1, POLE, PTEN, RAD51C, RAD51D, RECQL, RET, SDHA, SDHAF2, SDHB, SDHC, SDHD, SMAD4, SMARCA4, STK11, TP53, TSC1, TSC2, and VHL.  RNA data is routinely analyzed for use in variant interpretation for all genes.   05/2022 Imaging   MRI of the pelvis without contrast for rectal cancer surveillance IMPRESSION: No evidence of residual or recurrent  primary lesion. No pelvic adenopathy.     03/07/2023 Procedure   Screening colonoscopy  1.  Nonbleeding, internal hemorrhoids 2.  Diverticulosis 3.  Otherwise normal exam.      Discussed the use of AI scribe software for clinical note transcription with the patient, who gave verbal consent to proceed.  History of Present Illness Cynthia Bryan is a 78 year old female with rectal cancer who presents for follow-up.  She experiences no issues with bowel movements, including diarrhea, constipation, or bleeding. A partial sigmoidoscopy in March last year and a colonoscopy in December were both normal. She plans to have another colonoscopy at the end of this year or early next year.  She has an irregular heartbeat identified during knee replacement surgery in 2017 and is under cardiology care with medication adjustments. She is on Lasix , Toprol, and Losartan, with regular kidney function monitoring. She feels better and has more energy since the medication change.  She quit smoking nearly thirty years ago.     All other systems were reviewed with the patient and are negative.  MEDICAL HISTORY:  Past Medical History:  Diagnosis Date   Arthritis    back knees, shoulders   Basal cell carcinoma    right temple Dr. Therisa Molly removed 01/2017    Chicken pox    as child   Chronic back pain  COVID-19 10/13/2019   scratchy throat chills fever x 6 weeks x 6 weeks all symptoms resolved   Family history of bladder cancer    Family history of breast cancer    Family history of lung cancer    Family history of stomach cancer    Fatty liver    09/19/15   Gastric ulcer    neg H pylori  diet controlled   GERD (gastroesophageal reflux disease)    Gout    Hiatal hernia    SMALL   History of kidney stones    Hyperlipidemia    Hypertension    Lumbar herniated disc    Rectal cancer (HCC)    Recurrent UTI    last uti 6 months ago per pt on 05-23-2021   Rosacea    Syncope none since 2018  per pt    per pt presyncope no LOC felt dizzy/vision distorted and couldnt speak EEG neg, CT head neg 07/31/16, f/u [facility] cardiology with US  carotid, echo but no Holter    Umbilical hernia    noted 09/19/15    UTI (urinary tract infection)    Vitamin B 12 deficiency    Vitamin D  deficiency    Wears glasses     SURGICAL HISTORY: Past Surgical History:  Procedure Laterality Date   BIOPSY  04/27/2022   Procedure: BIOPSY;  Surgeon: Teresa Lonni HERO, MD;  Location: [facility];  Service: General;;   BREAST BIOPSY Right 1980   cyst removed negative pathology    colonscopy  04/17/2021   [facility]   CYSTOSCOPY W/ RETROGRADES Left 02/18/2020   Procedure: CYSTOSCOPY WITH RETROGRADE PYELOGRAM;  Surgeon: Kassie Ozell SAUNDERS, MD;  Location: [facility];  Service: Urology;  Laterality: Left;   EXTRACORPOREAL SHOCK WAVE LITHOTRIPSY Left 09/19/2017   Procedure: EXTRACORPOREAL SHOCK WAVE LITHOTRIPSY (ESWL);  Surgeon: Kassie Ozell SAUNDERS, MD;  Location: [facility];  Service: Urology;  Laterality: Left;   FLEXIBLE SIGMOIDOSCOPY N/A 04/27/2022   Procedure: DIAGNOSTIC FLEXIBLE SIGMOIDOSCOPY, ANORECTAL EXAM UNDER ANESTHESIA, POSSIBLE RECTAL BIOPSY;  Surgeon: Teresa Lonni HERO, MD;  Location: [facility];  Service: General;  Laterality: N/A;   JOINT REPLACEMENT     right knee 11/2015 Dr. Elgin Benjamin [facility] [place]   RECTAL EXAM UNDER ANESTHESIA N/A 05/26/2021   Procedure: ANORECTAL EXAM UNDER ANESTHESIA;  Surgeon: Teresa Lonni HERO, MD;  Location: [facility] [place];  Service: General;  Laterality: N/A;   TRANSANAL EXCISION OF RECTAL MASS N/A 05/26/2021   Procedure: TRANSANAL EXCISION OF RIGHT ANTERIOR LATERAL DISTAL RECTAL CANCER;  Surgeon: Teresa Lonni HERO, MD;  Location: [facility] [place];  Service: General;  Laterality: N/A;    I have reviewed the social history and family history with the patient and they are unchanged from previous note.  ALLERGIES:  is allergic to nortriptyline and  sulfa antibiotics.  MEDICATIONS:  Current Outpatient Medications  Medication Sig Dispense Refill   acetaminophen  (TYLENOL ) 650 MG CR tablet Take 1,300 mg by mouth 2 (two) times daily.     allopurinol  (ZYLOPRIM ) 300 MG tablet Take 1 tablet (300 mg total) by mouth daily. 90 tablet 3   atorvastatin  (LIPITOR) 40 MG tablet Take 1 tablet (40 mg total) by mouth daily at 6 PM. 90 tablet 3   Cyanocobalamin (B-12) 5000 MCG CAPS Take 5,000 mcg by mouth daily.      estradiol  (ESTRACE ) 0.1 MG/GM vaginal cream Estrogen Cream Instruction Discard applicator Apply pea sized amount to tip of finger to urethra before bed. Wash hands  well after application. Use Monday, Wednesday and Friday 42.5 g 12   ezetimibe  (ZETIA ) 10 MG tablet Take 1 tablet (10 mg total) by mouth at bedtime. 90 tablet 3   gabapentin  (NEURONTIN ) 300 MG capsule Take 1 capsule (300 mg total) by mouth at bedtime. 90 capsule 3   KLOR-CON  M10 10 MEQ tablet Take 10 mEq by mouth 2 (two) times daily.     losartan (COZAAR) 25 MG tablet Take 25 mg by mouth daily.     Magnesium 500 MG CAPS Take 500 mg by mouth at bedtime.      metoprolol succinate (TOPROL-XL) 50 MG 24 hr tablet Take 50 mg by mouth daily.     omeprazole  (PRILOSEC) 20 MG capsule Take 1 capsule (20 mg total) by mouth daily. 30 min before food (Patient taking differently: Take 20 mg by mouth every evening. 30 min before food) 90 capsule 3   potassium chloride  (MICRO-K ) 10 MEQ CR capsule Take 3 capsules (30 mEq total) by mouth every morning. (Patient taking differently: Take 20 mEq by mouth every morning.) 360 capsule 3   traMADol  (ULTRAM ) 50 MG tablet Takes 1 tab in am , 2 at hs 170 tablet 5   No current facility-administered medications for this visit.    PHYSICAL EXAMINATION: ECOG PERFORMANCE STATUS: 1 - Symptomatic but completely ambulatory  Vitals:   01/01/24 1040 01/01/24 1047  BP: (!) 158/70 (!) 148/66  Pulse: 78   Resp: 20   Temp: 98.7 F (37.1 C)   SpO2: 95%    Wt  Readings from Last 3 Encounters:  01/01/24 211 lb 1.6 oz (95.8 kg)  12/16/23 200 lb (90.7 kg)  10/01/23 216 lb 14.4 oz (98.4 kg)     GENERAL:alert, no distress and comfortable SKIN: skin color, texture, turgor are normal, no rashes or significant lesions EYES: normal, Conjunctiva are pink and non-injected, sclera clear NECK: supple, thyroid  normal size, non-tender, without nodularity LYMPH:  no palpable lymphadenopathy in the cervical, axillary  LUNGS: clear to auscultation and percussion with normal breathing effort HEART: regular rate & rhythm and no murmurs and no lower extremity edema ABDOMEN:abdomen soft, non-tender and normal bowel sounds Musculoskeletal:no cyanosis of digits and no clubbing  NEURO: alert & oriented x 3 with fluent speech, no focal motor/sensory deficits  Physical Exam    LABORATORY DATA:  I have reviewed the data as listed    Latest Ref Rng & Units 12/25/2023    9:46 AM 10/01/2023   12:22 PM 04/01/2023    9:39 AM  CBC  WBC 4.0 - 10.5 K/uL 6.7  6.5  5.8   Hemoglobin 12.0 - 15.0 g/dL 87.4  86.5  86.4   Hematocrit 36.0 - 46.0 % 38.5  41.1  41.5   Platelets 150 - 400 K/uL 208  231  211         Latest Ref Rng & Units 12/25/2023    9:46 AM 10/01/2023   12:22 PM 04/01/2023    9:39 AM  CMP  Glucose 70 - 99 mg/dL 889  890  96   BUN 8 - 23 mg/dL 23  19  21    Creatinine 0.44 - 1.00 mg/dL 8.97  9.06  9.15   Sodium 135 - 145 mmol/L 142  139  138   Potassium 3.5 - 5.1 mmol/L 4.4  4.0  4.0   Chloride 98 - 111 mmol/L 104  100  101   CO2 22 - 32 mmol/L 31  30  29    Calcium   8.9 - 10.3 mg/dL 89.8  9.6  89.9   Total Protein 6.5 - 8.1 g/dL 6.6  7.0  6.8   Total Bilirubin 0.0 - 1.2 mg/dL 0.9  0.8  0.8   Alkaline Phos 38 - 126 U/L 98  104  101   AST 15 - 41 U/L 10  10  10    ALT 0 - 44 U/L 9  12  18        RADIOGRAPHIC STUDIES: I have personally reviewed the radiological images as listed and agreed with the findings in the report. No results found.    Orders  Placed This Encounter  Procedures   MR PELVIS WO CM RECTAL CA STAGING    Standing Status:   Future    Expected Date:   06/24/2024    Expiration Date:   12/31/2024    If indicated for the ordered procedure, I authorize the administration of contrast media per Radiology protocol:   Yes    What is the patient's sedation requirement?:   No Sedation    Does the patient have a pacemaker or implanted devices?:   No    Preferred imaging location?:   [facility] (table limit - 500lbs)   All questions were answered. The patient knows to call the clinic with any problems, questions or concerns. No barriers to learning was detected. The total time spent in the appointment was 25 minutes, including review of chart and various tests results, discussions about plan of care and coordination of care plan     Onita Mattock, MD 01/01/2024

## 2024-01-09 NOTE — Progress Notes (Signed)
 Established patient Visit    Chief Complaint: Shortness of breath Date of Service: 01/09/2024 Date of Birth: 1946/02/18 PCP: Auston Reyes BIRCH, MD  History of Present Illness: Cynthia Bryan is a 78 y.o.female patient who presented for follow-up shortness of breath  Past medical history significant for nonischemic cardiomyopathy, hypertension, hyperlipidemia, obesity, prior tobacco use-quit 1997, family history of CAD in father.  Echocardiogram 10/2023 with reduced LVEF of 40% with global hypokinesis. no significant valvular abnormality.  PET/CT stress test 12/2023 with no evidence of ischemia or infarction, LVEF moderately reduced suggesting nonischemic cardiomyopathy.  BNP 12/2023 elevated at 193.  With symptoms of shortness of breath/orthopnea, started on Lasix  during last visit.  BNP elevated 193.  Today patient details that her shortness of breath/orthopnea symptoms much improved.  Not sleeping in recliner anymore. No chest pain/pressure, dizziness or syncope.  Past Medical and Surgical History  Past Medical History Past Medical History:  Diagnosis Date   Adnexal mass 02/05/2022   Allergic state Childhood   Sulfur Drug; Codeine medications   Barrett's esophagus    last EGD 2011, Dr. Gaylyn   Basal cell carcinoma    Dr. 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   Brother and nephew with history of DVT   Fracture of foot 2010   GERD (gastroesophageal reflux disease)    Gout    History of knee replacement 11/2015   right knee    Hyperlipidemia    Hypertension    Infertility management    Obesity    Osteoporosis    Post concussion syndrome     Prolonged Q-T interval on ECG 11/14/2015   Documented initially 2007, again 2017   Rectal cancer (CMS/HHS-HCC) 02/16/2022   Vitamin D  deficiency 12/30/2013    Past Surgical History She has a past surgical history that includes Breast excisional biopsy (Right, 1980); Colonoscopy (04/17/2021); Transanal Excision of Right Anterior Lateral Distal Rectal Cancer (05/26/2021); hysteroscopy (N/A, 02/21/2022); dilation & currettage (N/A, 02/21/2022); pelvic examination under anesthesia (N/A, 02/21/2022); Breast surgery (Cyst removed from right breast); Hysteroscopy; Joint replacement (Right Knee); robot assisted laparoscopic surgery with total hysterectomy (Bilateral, 04/04/2022); pelvic examination under anesthesia (N/A, 04/04/2022); and Colon @ [facility] (02/22/2023).   Medications and Allergies  Current Medications Current Outpatient Medications  Medication Sig Dispense Refill   acetaminophen  (TYLENOL ) 500 MG tablet Take 1,000 mg by mouth every 8 (eight) hours as needed for Pain     allopurinoL  (ZYLOPRIM ) 300 MG tablet TAKE 1 TABLET ONCE DAILY 90 tablet 3   amoxicillin (AMOXIL) 500 MG tablet      atorvastatin  (LIPITOR) 20 MG tablet TAKE 1 TABLET ONCE DAILY 90 tablet 3   cholecalciferol (VITAMIN D3) 5,000 unit capsule Take 5,000 Units by mouth once daily     cyanocobalamin, vitamin B-12, 5,000 mcg Cap Take 5,000 mcg by mouth once daily     ezetimibe  (ZETIA ) 10 mg tablet TAKE 1 TABLET AT BEDTIME 90 tablet 3   FUROsemide  (LASIX ) 20 MG tablet Take 1 tablet (20 mg total) by mouth once daily 30 tablet 11   gabapentin  (NEURONTIN ) 100 MG capsule 1 po qHS x 4 days, then bid x 4 days, then tid 90  capsule 3   garlic 1,000 mg Cap Take by mouth     KLOR-CON  M10 10 mEq ER tablet TAKE 1 TABLET TWICE A DAY 180 tablet 3   losartan (COZAAR) 50 MG tablet Take 1 tablet (50 mg total) by mouth once daily 30 tablet 11   magnesium 250 mg Tab Take 500 mg by mouth at bedtime     metoprolol SUCCinate (TOPROL-XL)  50 MG XL tablet Take 1 tablet (50 mg total) by mouth once daily 30 tablet 11   omeprazole  (PRILOSEC) 20 MG DR capsule TAKE 1 CAPSULE ONCE DAILY 90 capsule 3   traMADoL  (ULTRAM ) 50 mg tablet 1 po bid prn 60 tablet 5   spironolactone (ALDACTONE) 25 MG tablet Take 1 tablet (25 mg total) by mouth once daily 30 tablet 11   No current facility-administered medications for this visit.    Allergies Nortriptyline and Sulfa (sulfonamide antibiotics)  Social and Family History  Social History  reports that she quit smoking about 28 years ago. Her smoking use included cigarettes. She started smoking about 57 years ago. She has a 28.5 pack-year smoking history. She has never used smokeless tobacco. She reports that she does not drink alcohol and does not use drugs.  Family History family history includes Breast cancer in her mother; Cancer in her brother and mother; Coronary Artery Disease (Blocked arteries around heart) in her brother; Coronary Artery Disease (Blocked arteries around heart) (age of onset: 110) in her father; Deep vein thrombosis (DVT or abnormal blood clot formation) in her brother; High blood pressure (Hypertension) in her brother, maternal aunt, maternal aunt, maternal uncle, maternal uncle, and mother; Hyperlipidemia (Elevated cholesterol) in her mother; Osteoarthritis in her father; Other in her brother; Other (age of onset: 64) in her brother; Skin cancer in her father; Stroke in her brother, maternal aunt, maternal aunt, maternal uncle, and maternal uncle; Stroke (age of onset: 23) in her mother.   Review of Systems   Review of Systems:  Exertional dyspnea-much improved  Physical Examination   Vitals:BP 130/72   Pulse 77   Ht 167.6 cm (5' 6)   Wt 92.5 kg (204 lb)   SpO2 96%   BMI 32.93 kg/m  Ht:167.6 cm (5' 6) Wt:92.5 kg (204 lb) ADJ:Anib surface area is 2.08 meters squared. Body mass index is 32.93 kg/m.  HEENT: Pupils equally reactive to light and  accomodation  Neck: Supple, no significant JVD Lungs: clear to auscultation bilaterally; no wheezes, rales, rhonchi Heart: Regular rate and rhythm.Grade 2/6 systolic murmur LLSB Extremities: no pedal edema  Assessment and Plan   78 y.o. female with  Heart failure with reduced EF Nonischemic cardiomyopathy, LVEF 40% PET/CT stress test 12/2023 with no ischemia, mild coronary calcification noted Hypertension Hyperlipidemia Frequent PACs History of tobacco use-quit 1997 RBBB Obesity Family history of CAD  Symptoms of shortness of breath/orthopnea improved since starting Lasix . Continue Lasix  20 mg daily. Will continue to optimize GDMT for heart failure. Continue losartan and Toprol-XL. Will start spironolactone 25 mg daily.  BMP in 1 week Entresto/Jardiance not affordable.  She is going to change insurance in January and will try to make adjustments after that Continue statin  Orders Placed This Encounter  Procedures   Basic Metabolic Panel (BMP)    Return in about 3 months (around 04/10/2024).  KRISHNA CHAITANYA ALLURI, MD  This dictation was prepared with dragon dictation. Any transcription errors that result from this process are unintentional.

## 2024-02-10 ENCOUNTER — Other Ambulatory Visit: Payer: Self-pay

## 2024-03-25 ENCOUNTER — Other Ambulatory Visit: Payer: Self-pay | Admitting: Orthopedic Surgery

## 2024-03-26 NOTE — Progress Notes (Signed)
 Cynthia Bryan is a 79 y.o. here for Medicare Wellness Visit  MEDICARE WELLNESS VISIT  Providers Rendering Care 1. Dr. Reyes Costa (PCP) Pt in NAD. HTN stable on meds. Has HLD on statin, Vitamin D  def on po supplement and prediabetes not on meds. Also with GERD on PPI. BMI>34. Weight stable. Having hip surgery in 2-3 weeks. Not sleeping well due to pain. No fever. Denies CP or SOB. No palpitations. No change in bowels or bladder. UTD with colon and MMG.  Functional Assessment (1) Hearing: Demonstrates no difficulty in hearing during normal conversation (2) Risk of Falls: Patient denies any falls or near falls in the last year, Gait steady without assistance during walk from waiting area to exam room (3) Home Safety: Patient feels secure in their home, There are operational smoke alarms in multiple areas of the home (4) Activities of Daily Living: Independently manages personal grooming and household chores, including cooking, cleaning and laundry. Manages Personal finances without assistance.  Depression Screening PHQ 2/9 last 3 flowsheet values     11/10/2015    2:15 PM 03/08/2022    1:49 PM 03/13/2023    8:58 AM  PHQ-2/9 Depression Screening   Little interest or pleasure in doing things   0  Feeling down, depressed, or hopeless   0  Patient Health Questionnaire-2 Score   0 *  (OBSOLETE) Little interest or pleasure in doing things 0 * 0   (OBSOLETE) Feeling down, depressed, or hopeless (or irritable for Teens only)? 0 * 0   (OBSOLETE) Total Prescreening Score 0 * 0   (OBSOLETE) Trouble falling or staying asleep, or sleeping too much? 0    (OBSOLETE) Feeling tired or having little energy? 0    (OBSOLETE) Poor appetite or overeating? 0    (OBSOLETE) Feeling bad about yourself - or that you are a failure or have let yourself or your family down? 0    (OBSOLETE) Trouble concentrating on things, such as reading the newspaper or watching television? 0    (OBSOLETE) Moving or speaking so  slowly that other people could have noticed?  Or the opposite - being so fidgety or restless that you have been moving around a lot more than usual? 0    (OBSOLETE) Thoughts that you would be better off dead, or of hurting yourself in some way? 0    (OBSOLETE) Total Score = 0 0     * Data saved with a previous flowsheet row definition     Depression Severity and Treatment Recommendations:  0-4= None  5-9= Mild / Treatment: Support, educate to call if worse; return in one month  10-14= Moderate / Treatment: Support, watchful waiting; Antidepressant or Psychotherapy  15-19= Moderately severe / Treatment: Antidepressant OR Psychotherapy  >= 20 = Major depression, severe / Antidepressant AND Psychotherapy   Cognitive Impairment Patient denies episodes of loosing things, being forgetful. Seems oriented to person, place and time.  Responses appear appropriate and timely to this observer.  PREVENTION PLAN  Cardiovascular: FLP assessed LDL=51 Diabetes: A1c or FBG assessed A1c=6.3 Glaucoma: N/A Hepatitis B (HBV) Vaccine:  Not Applicable Smoking Cessation:  Not Applicable  Other Personalized Health Advice  Encouraged patient to exercise 5 days a week, walking, water aerobics, gentle stretching recommended. Increase dietary intake of fresh fruits and vegetables, reduce red meat to twice a week.  End of Life Counseling Patient has living will in place; POA - ; Full Code  Current Outpatient Medications  Medication Sig Dispense Refill  acetaminophen  (TYLENOL ) 500 MG tablet Take 1,000 mg by mouth every 8 (eight) hours as needed for Pain     allopurinoL  (ZYLOPRIM ) 300 MG tablet TAKE 1 TABLET ONCE DAILY 90 tablet 1   amoxicillin (AMOXIL) 500 MG tablet      atorvastatin  (LIPITOR) 20 MG tablet TAKE 1 TABLET ONCE DAILY 90 tablet 1   cholecalciferol (VITAMIN D3) 5,000 unit capsule Take 5,000 Units by mouth once daily     cyanocobalamin, vitamin B-12, 5,000 mcg Cap Take 5,000 mcg by mouth  once daily     ezetimibe  (ZETIA ) 10 mg tablet TAKE 1 TABLET AT BEDTIME 90 tablet 1   FUROsemide  (LASIX ) 20 MG tablet Take 1 tablet (20 mg total) by mouth once daily 30 tablet 11   gabapentin  (NEURONTIN ) 300 MG capsule 1 po qHS x 4 days, then bid x 4 days, then tid 90 capsule 0   garlic 1,000 mg Cap Take by mouth     losartan (COZAAR) 50 MG tablet Take 1 tablet (50 mg total) by mouth once daily 30 tablet 11   magnesium 250 mg Tab Take 500 mg by mouth at bedtime     metoprolol SUCCinate (TOPROL-XL) 50 MG XL tablet Take 1 tablet (50 mg total) by mouth once daily 30 tablet 11   omeprazole  (PRILOSEC) 20 MG DR capsule TAKE 1 CAPSULE ONCE DAILY 90 capsule 1   potassium chloride  (KLOR-CON  M10) 10 mEq ER tablet TAKE 1 TABLET TWICE A DAY 180 tablet 1   spironolactone (ALDACTONE) 25 MG tablet Take 1 tablet (25 mg total) by mouth once daily 30 tablet 11   traMADoL  (ULTRAM ) 50 mg tablet 1 po bid prn 60 tablet 5   No current facility-administered medications for this visit.    Allergies as of 03/26/2024 - Reviewed 03/26/2024  Allergen Reaction Noted   Nortriptyline Nausea 10/09/2016   Sulfa (sulfonamide antibiotics) Unknown and Rash 09/17/2011    Patient Active Problem List  Diagnosis   Hyperlipidemia   Hypertension   Gout   Osteopenia   Cyst of breast, right, benign solitary   Benign breast lumps   Vitamin D  deficiency   Chronic pain of right knee   Prolonged Q-T interval on ECG   Factor V Leiden, prothrombin gene mutation (HHS-HCC)   Memory loss, short term   Post concussion syndrome   Syncope   Displacement of lumbar intervertebral disc without myelopathy   DDD (degenerative disc disease), lumbar   Low back pain   History of total knee arthroplasty   Hip pain   Osteoarthritis of knee   Sacroiliitis ()   Spondylosis of lumbar region without myelopathy or radiculopathy   Cervical stenosis (uterine cervix)   Endometrial mass   Preoperative  evaluation of a medical condition to rule out surgical contraindications (TAR required)   Post-operative state   Prediabetes   Class 2 severe obesity due to excess calories with serious comorbidity and body mass index (BMI) of 35.0 to 35.9 in adult    Past Medical History:  Diagnosis Date   Adnexal mass 02/05/2022   Allergic state Childhood   Sulfur Drug; Codeine medications   Barrett's esophagus    last EGD 2011, Dr. Gaylyn   Basal cell carcinoma    Dr. 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   Brother and nephew with history of DVT   Fracture of foot 2010   GERD (gastroesophageal reflux disease)    Gout    History of knee replacement 11/2015   right knee    Hyperlipidemia    Hypertension    Infertility management    Obesity    Osteoporosis    Post concussion syndrome    Prolonged Q-T interval on ECG 11/14/2015   Documented initially 2007, again 2017   Rectal cancer (CMS/HHS-HCC) 02/16/2022   Vitamin D  deficiency 12/30/2013    Past Surgical History:  Procedure Laterality Date   BREAST EXCISIONAL BIOPSY Right 1980   benign cyst   COLONOSCOPY  04/17/2021   Moderately differentiated adenocarcinoma. Anorectal junction mucosa wth adenomatous high grade dysplasia/Repeat 6 months/TKT   Transanal Excision of Right Anterior Lateral Distal Rectal Cancer  05/26/2021   Dr. KYM Pizza   HYSTEROSCOPY N/A 02/21/2022   Procedure: HYSTEROSCOPY, DIAGNOSTIC (SEPARATE PROCEDURE);  Surgeon: Mancil Barter, MD;  Location[facility] OR;  Service: Gynecology;  Laterality: N/A;   DILATION & CURRETTAGE N/A 02/21/2022   Procedure: DILATION  AND CURRETTAGE, DIAGNOSTIC AND/OR THERAPEUTIC (NONOBSTETRICAL);  Surgeon: Mancil Barter, MD;  Location: [facility] OR;  Service: Gynecology;  Laterality: N/A;   PELVIC EXAMINATION UNDER ANESTHESIA N/A 02/21/2022   Procedure: PELVIC EXAMINATION UNDER ANESTHESIA (OTHER THAN LOCAL);  Surgeon: Mancil Barter, MD;  Location: [facility] OR;  Service: Gynecology;  Laterality: N/A;   ROBOT ASSISTED LAPAROSCOPIC SURGERY WITH TOTAL HYSTERECTOMY Bilateral 04/04/2022   Procedure: *ROBOT* ASSISTED LAPAROSCOPY, SURGICAL; WITH TOTAL HYSTERECTOMY, FOR UTERUS 250 G OR LESS; WITH REMOVAL OF TUBE(S) AND / OR OVARY(S);  Surgeon: Eloy Maurilio BROCKS, MD;  Location: [facility] OR;  Service: Gynecology;  Laterality: Bilateral;   PELVIC EXAMINATION UNDER ANESTHESIA N/A 04/04/2022   Procedure: PELVIC EXAMINATION UNDER ANESTHESIA (OTHER THAN LOCAL);  Surgeon: Eloy Maurilio BROCKS, MD;  Location: [facility] OR;  Service: Gynecology;  Laterality: N/A;   Colon @ [facility]  02/22/2023   Anal tissue/PHx rectal cancer/Repeat 49yr/TKT   BREAST SURGERY  Cyst removed from right breast   11/04/1978   HYSTEROSCOPY     JOINT REPLACEMENT  Right Knee   11/24/15    Health Maintenance  Topic Date Due   Shingrix (1 of 2) Never done   Annual Urine Albumin Creatinine Ratio  10/13/2016   DXA Bone Density Scan  04/09/2017   Mammogram  10/17/2017   COVID-19 Vaccine (1 - 2025-26 season) Never done   Influenza Vaccine (1) 11/04/2023   Colorectal Cancer Screening  02/22/2024   Depression Screening  03/12/2024   Annual Physical/Well Child Check  03/13/2024   Creatinine Level  09/15/2024   Lipid Panel  09/15/2024   Potassium Level  01/15/2025   Serum Calcium   01/15/2025   Medicare Subsequent AWV H9560  03/27/2025   Diabetes Screening  09/16/2026   Adult Tetanus (Td And Tdap)  10/07/2028   Medicare Initial AWV H9561  Completed   Pneumococcal Vaccine: 50+  Completed   Hepatitis C Screen  Completed   RSV Immunization  Pregnant or 50+  Completed   Hib Vaccines  Aged Out   Hepatitis A Vaccines  Aged Out   Meningococcal B Vaccine  Aged Out   Meningococcal ACWY Vaccine  Aged Out   HPV Vaccines  Aged Out    Vitals:   03/26/24 1026  BP: 138/70  Pulse: 100  SpO2: 96%  Weight: 94.8 kg (209 lb)  PainSc: 10-Worst pain ever  PainLoc: Hip   Body mass index is 34.78 kg/m. General: Alert oriented x3  Eyes: Sclera and conjunctiva clear; pupils equal round and reactive to light and accommodation; extraocular movements intact Nose: Mucosa healthy without drainage or ulceration Oropharynx: No suspicious lesions Neck: No swelling, masses, stiffness, pain, limited movement, carotid pulses normal bilaterally, thyroid  normal size, no masses palpated. No bruits heard. Lungs: Respirations unlabored; clear to auscultation bilaterally Back: No spinal deformity Cardiovascular: Heart regular rate and rhythm without murmurs, gallops, or rubs Abdomen: Soft; non tender; non distended;  no masses or organomegaly Lymph Nodes: No significant cervical, supraclavicular, or axillary lymphadenopathy noted Musculoskeletal: No active joint inflammation Extremities: Normal, no edema Pulses: Dorsalis pedis palpable and symmetric bilaterally Neurologic: Alert and oriented; speech intact; face symmetrical; moves all extremities well    Assessment/Plan  1. Medicare wellness visit- Medications and allergies reviewed. Copy of preventative health provided.  Labs reviewed. 2. Hip pain- add baclofen 3. HTN- stable, same meds 4. HLD- diet/exercise/statin, labs today 5. Vitamin D  def- stable, same dose 6. RTC 3 mo, sooner if needed Reyes JONETTA Costa, MD, MD  *Some images could not be shown.

## 2024-04-08 ENCOUNTER — Other Ambulatory Visit: Payer: Self-pay

## 2024-04-08 ENCOUNTER — Inpatient Hospital Stay
Admission: RE | Admit: 2024-04-08 | Discharge: 2024-04-08 | Disposition: A | Source: Ambulatory Visit | Attending: Orthopedic Surgery

## 2024-04-08 VITALS — BP 133/57 | HR 63 | Resp 14 | Ht 66.0 in | Wt 209.6 lb

## 2024-04-08 DIAGNOSIS — M1612 Unilateral primary osteoarthritis, left hip: Secondary | ICD-10-CM | POA: Insufficient documentation

## 2024-04-08 DIAGNOSIS — Z01812 Encounter for preprocedural laboratory examination: Secondary | ICD-10-CM | POA: Insufficient documentation

## 2024-04-08 HISTORY — DX: Atherosclerosis of aorta: I70.0

## 2024-04-08 HISTORY — DX: Other cardiomyopathies: I42.8

## 2024-04-08 HISTORY — DX: Other intervertebral disc degeneration, lumbar region without mention of lumbar back pain or lower extremity pain: M51.369

## 2024-04-08 HISTORY — DX: Unilateral primary osteoarthritis, left hip: M16.12

## 2024-04-08 HISTORY — DX: Activated protein C resistance: D68.51

## 2024-04-08 LAB — COMPREHENSIVE METABOLIC PANEL WITH GFR
ALT: 11 U/L (ref 0–44)
AST: 17 U/L (ref 15–41)
Albumin: 4.2 g/dL (ref 3.5–5.0)
Alkaline Phosphatase: 111 U/L (ref 38–126)
Anion gap: 13 (ref 5–15)
BUN: 25 mg/dL — ABNORMAL HIGH (ref 8–23)
CO2: 24 mmol/L (ref 22–32)
Calcium: 10 mg/dL (ref 8.9–10.3)
Chloride: 104 mmol/L (ref 98–111)
Creatinine, Ser: 1.01 mg/dL — ABNORMAL HIGH (ref 0.44–1.00)
GFR, Estimated: 57 mL/min — ABNORMAL LOW
Glucose, Bld: 113 mg/dL — ABNORMAL HIGH (ref 70–99)
Potassium: 4.6 mmol/L (ref 3.5–5.1)
Sodium: 140 mmol/L (ref 135–145)
Total Bilirubin: 0.7 mg/dL (ref 0.0–1.2)
Total Protein: 6.8 g/dL (ref 6.5–8.1)

## 2024-04-08 LAB — TYPE AND SCREEN
ABO/RH(D): A NEG
Antibody Screen: NEGATIVE

## 2024-04-08 LAB — URINALYSIS, ROUTINE W REFLEX MICROSCOPIC
Bilirubin Urine: NEGATIVE
Glucose, UA: NEGATIVE mg/dL
Hgb urine dipstick: NEGATIVE
Ketones, ur: NEGATIVE mg/dL
Leukocytes,Ua: NEGATIVE
Nitrite: NEGATIVE
Protein, ur: NEGATIVE mg/dL
Specific Gravity, Urine: 1.019 (ref 1.005–1.030)
pH: 5 (ref 5.0–8.0)

## 2024-04-08 LAB — SURGICAL PCR SCREEN
MRSA, PCR: NEGATIVE
Staphylococcus aureus: NEGATIVE

## 2024-04-08 NOTE — Patient Instructions (Addendum)
 Your procedure is scheduled on:04-15-24 Wednesday Report to the Registration Desk on the 1st floor of the [facility].Then proceed to the 2nd floor Surgery Desk To find out your arrival time, please call [phone] between 1PM - 3PM on:04-14-24 Tuesday If your arrival time is 6:00 am, do not arrive before that time as the [facility] entrance doors do not open until 6:00 am.  REMEMBER: Instructions that are not followed completely may result in serious medical risk, up to and including death; or upon the discretion of your surgeon and anesthesiologist your surgery may need to be rescheduled.  Do not eat food after midnight the night before surgery.  No gum chewing or hard candies.  You may however, drink CLEAR liquids up to 2 hours before you are scheduled to arrive for your surgery. Do not drink anything within 2 hours of your scheduled arrival time.  Clear liquids include: - water  - apple juice without pulp - gatorade (not RED colors) - black coffee or tea (Do NOT add milk or creamers to the coffee or tea) Do NOT drink anything that is not on this list.  In addition, your doctor has ordered for you to drink the provided:  Ensure Pre-Surgery Clear Carbohydrate Drink  Drinking this carbohydrate drink up to two hours before surgery helps to reduce insulin resistance and improve patient outcomes. Please complete drinking 2 hours before scheduled arrival time.  One week prior to surgery:Stop NOW (04-08-24) Stop Anti-inflammatories (NSAIDS) such as Advil, Aleve, Ibuprofen, Motrin, Naproxen, Naprosyn and Aspirin based products such as Excedrin, Goody's Powder, BC Powder. Stop ANY OVER THE COUNTER supplements until after surgery (Vitamin D3, Vitamin B12, Garlic, Magnesium)  You may however, continue to take Tylenol /Tramadol  if needed for pain up until the day of surgery.  Continue taking all of your other prescription medications up until the day of surgery.  ON THE DAY OF SURGERY ONLY  TAKE THESE MEDICATIONS WITH SIPS OF WATER: -gabapentin  (NEURONTIN )  -KLOR-CON   -metoprolol succinate (TOPROL-XL)  -You may take baclofen (LIORESAL) if needed  No Alcohol for 24 hours before or after surgery.  No Smoking including e-cigarettes for 24 hours before surgery.  No chewable tobacco products for at least 6 hours before surgery.  No nicotine patches on the day of surgery.  Do not use any recreational drugs for at least a week (preferably 2 weeks) before your surgery.  Please be advised that the combination of cocaine and anesthesia may have negative outcomes, up to and including death. If you test positive for cocaine, your surgery will be cancelled.  On the morning of surgery brush your teeth with toothpaste and water, you may rinse your mouth with mouthwash if you wish. Do not swallow any toothpaste or mouthwash.  Use CHG Soap as directed on instruction sheet.  Do not wear jewelry, make-up, hairpins, clips or nail polish.  For welded (permanent) jewelry: bracelets, anklets, waist bands, etc.  Please have this removed prior to surgery.  If it is not removed, there is a chance that hospital personnel will need to cut it off on the day of surgery.  Do not wear lotions, powders, or perfumes.   Do not shave body hair from the neck down 48 hours before surgery.  Contact lenses, hearing aids and dentures may not be worn into surgery.  Do not bring valuables to the hospital. [facility] is not responsible for any missing/lost belongings or valuables.   Notify your doctor if there is any change in  your medical condition (cold, fever, infection).  Wear comfortable clothing (specific to your surgery type) to the hospital.  After surgery, you can help prevent lung complications by doing breathing exercises.  Take deep breaths and cough every 1-2 hours. Your doctor may order a device called an Incentive Spirometer to help you take deep breaths. When coughing or sneezing, hold a  pillow firmly against your incision with both hands. This is called splinting. Doing this helps protect your incision. It also decreases belly discomfort.  If you are being admitted to the hospital overnight, leave your suitcase in the car. After surgery it may be brought to your room.  In case of increased patient census, it may be necessary for you, the patient, to continue your postoperative care in the Same Day Surgery department.  If you are being discharged the day of surgery, you will not be allowed to drive home. You will need a responsible individual to drive you home and stay with you for 24 hours after surgery.   If you are taking public transportation, you will need to have a responsible individual with you.  Please call the Pre-admissions Testing Dept. at [phone] if you have any questions about these instructions.  Surgery Visitation Policy:  Patients having surgery or a procedure may have two visitors.  Children under the age of 71 must have an adult with them who is not the patient.  Inpatient Visitation:    Visiting hours are 7 a.m. to 8 p.m. Up to four visitors are allowed at one time in a patient room. The visitors may rotate out with other people during the day.  One visitor age 88 or older may stay with the patient overnight and must be in the room by 8 p.m.     Pre-operative 4 CHG Bath Instructions   You can play a key role in reducing the risk of infection after surgery. Your skin needs to be as free of germs as possible. You can reduce the number of germs on your skin by washing with CHG (chlorhexidine  gluconate) soap before surgery. CHG is an antiseptic soap that kills germs and continues to kill germs even after washing.   DO NOT use if you have an allergy to chlorhexidine /CHG or antibacterial soaps. If your skin becomes reddened or irritated, stop using the CHG and notify one of our RNs at [phone].   Please shower with the CHG soap starting 4  days before surgery using the following schedule:     Please keep in mind the following:  DO NOT shave, including legs and underarms, starting the day of your first shower.   You may shave your face at any point before/day of surgery.  Place clean sheets on your bed the day you start using CHG soap. Use a clean washcloth (not used since being washed) for each shower. DO NOT sleep with pets once you start using the CHG.   CHG Shower Instructions:  If you choose to wash your hair and private area, wash first with your normal shampoo/soap.  After you use shampoo/soap, rinse your hair and body thoroughly to remove shampoo/soap residue.  Turn the water OFF and apply about 3 tablespoons (45 ml) of CHG soap to a CLEAN washcloth.  Apply CHG soap ONLY FROM YOUR NECK DOWN TO YOUR TOES (washing for 3-5 minutes)  DO NOT use CHG soap on face, private areas, open wounds, or sores.  Pay special attention to the area where your surgery is being performed.  If you are having back surgery, having someone wash your back for you may be helpful. Wait 2 minutes after CHG soap is applied, then you may rinse off the CHG soap.  Pat dry with a clean towel  Put on clean clothes/pajamas   If you choose to wear lotion, please use ONLY the CHG-compatible lotions on the back of this paper.     Additional instructions for the day of surgery: DO NOT APPLY any lotions, deodorants, cologne, or perfumes.   Put on clean/comfortable clothes.  Brush your teeth.  Ask your nurse before applying any prescription medications to the skin.      CHG Compatible Lotions   Aveeno Moisturizing lotion  Cetaphil Moisturizing Cream  Cetaphil Moisturizing Lotion  Clairol Herbal Essence Moisturizing Lotion, Dry Skin  Clairol Herbal Essence Moisturizing Lotion, Extra Dry Skin  Clairol Herbal Essence Moisturizing Lotion, Normal Skin  Curel Age Defying Therapeutic Moisturizing Lotion with Alpha Hydroxy  Curel Extreme Care Body  Lotion  Curel Soothing Hands Moisturizing Hand Lotion  Curel Therapeutic Moisturizing Cream, Fragrance-Free  Curel Therapeutic Moisturizing Lotion, Fragrance-Free  Curel Therapeutic Moisturizing Lotion, Original Formula  Eucerin Daily Replenishing Lotion  Eucerin Dry Skin Therapy Plus Alpha Hydroxy Crme  Eucerin Dry Skin Therapy Plus Alpha Hydroxy Lotion  Eucerin Original Crme  Eucerin Original Lotion  Eucerin Plus Crme Eucerin Plus Lotion  Eucerin TriLipid Replenishing Lotion  Keri Anti-Bacterial Hand Lotion  Keri Deep Conditioning Original Lotion Dry Skin Formula Softly Scented  Keri Deep Conditioning Original Lotion, Fragrance Free Sensitive Skin Formula  Keri Lotion Fast Absorbing Fragrance Free Sensitive Skin Formula  Keri Lotion Fast Absorbing Softly Scented Dry Skin Formula  Keri Original Lotion  Keri Skin Renewal Lotion Keri Silky Smooth Lotion  Keri Silky Smooth Sensitive Skin Lotion  Nivea Body Creamy Conditioning Oil  Nivea Body Extra Enriched Lotion  Nivea Body Original Lotion  Nivea Body Sheer Moisturizing Lotion Nivea Crme  Nivea Skin Firming Lotion  NutraDerm 30 Skin Lotion  NutraDerm Skin Lotion  NutraDerm Therapeutic Skin Cream  NutraDerm Therapeutic Skin Lotion  ProShield Protective Hand Cream  Provon moisturizing lotion  How to Use an Incentive Spirometer An incentive spirometer is a tool that measures how well you are filling your lungs with each breath. Learning to take long, deep breaths using this tool can help you keep your lungs clear and active. This may help to reverse or lessen your chance of developing breathing (pulmonary) problems, especially infection. You may be asked to use a spirometer: After a surgery. If you have a lung problem or a history of smoking. After a long period of time when you have been unable to move or be active. If the spirometer includes an indicator to show the highest number that you have reached, your health care  provider or respiratory therapist will help you set a goal. Keep a log of your progress as told by your health care provider. What are the risks? Breathing too quickly may cause dizziness or cause you to pass out. Take your time so you do not get dizzy or light-headed. If you are in pain, you may need to take pain medicine before doing incentive spirometry. It is harder to take a deep breath if you are having pain. How to use your incentive spirometer  Sit up on the edge of your bed or on a chair. Hold the incentive spirometer so that it is in an upright position. Before you use the spirometer, breathe out normally. Place the mouthpiece  in your mouth. Make sure your lips are closed tightly around it. Breathe in slowly and as deeply as you can through your mouth, causing the piston or the ball to rise toward the top of the chamber. Hold your breath for 3-5 seconds, or for as long as possible. If the spirometer includes a coach indicator, use this to guide you in breathing. Slow down your breathing if the indicator goes above the marked areas. Remove the mouthpiece from your mouth and breathe out normally. The piston or ball will return to the bottom of the chamber. Rest for a few seconds, then repeat the steps 10 or more times. Take your time and take a few normal breaths between deep breaths so that you do not get dizzy or light-headed. Do this every 1-2 hours when you are awake. If the spirometer includes a goal marker to show the highest number you have reached (best effort), use this as a goal to work toward during each repetition. After each set of 10 deep breaths, cough a few times. This will help to make sure that your lungs are clear. If you have an incision on your chest or abdomen from surgery, place a pillow or a rolled-up towel firmly against the incision when you cough. This can help to reduce pain while taking deep breaths and coughing. General tips When you are able to get out of  bed: Walk around often. Continue to take deep breaths and cough in order to clear your lungs. Keep using the incentive spirometer until your health care provider says it is okay to stop using it. If you have been in the hospital, you may be told to keep using the spirometer at home. Contact a health care provider if: You are having difficulty using the spirometer. You have trouble using the spirometer as often as instructed. Your pain medicine is not giving enough relief for you to use the spirometer as told. You have a fever. Get help right away if: You develop shortness of breath. You develop a cough with bloody mucus from the lungs. You have fluid or blood coming from an incision site after you cough. Summary An incentive spirometer is a tool that can help you learn to take long, deep breaths to keep your lungs clear and active. You may be asked to use a spirometer after a surgery, if you have a lung problem or a history of smoking, or if you have been inactive for a long period of time. Use your incentive spirometer as instructed every 1-2 hours while you are awake. If you have an incision on your chest or abdomen, place a pillow or a rolled-up towel firmly against your incision when you cough. This will help to reduce pain. Get help right away if you have shortness of breath, you cough up bloody mucus, or blood comes from your incision when you cough. This information is not intended to replace advice given to you by your health care provider. Make sure you discuss any questions you have with your health care provider. Document Revised: 12/28/2022 Document Reviewed: 12/28/2022 Elsevier Patient Education  2024 [employer]    Preoperative Educational Videos for Total Hip, Knee and Shoulder Replacements  To better prepare for surgery, please view our videos that explain the physical activity and discharge planning required to have the best surgical recovery at [facility].  [email or web address]  Questions? Call [phone] or email [email or web address] to  address [email or web address] social needs:  [email or web address]

## 2024-04-15 ENCOUNTER — Ambulatory Visit: Admission: RE | Admit: 2024-04-15 | Payer: Self-pay | Source: Home / Self Care | Admitting: Orthopedic Surgery

## 2024-04-15 ENCOUNTER — Encounter: Admission: RE | Payer: Self-pay | Source: Home / Self Care

## 2024-04-15 ENCOUNTER — Encounter: Payer: Self-pay | Admitting: Urgent Care

## 2024-07-01 ENCOUNTER — Inpatient Hospital Stay

## 2024-07-01 ENCOUNTER — Inpatient Hospital Stay: Admitting: Hematology
